# Patient Record
Sex: Female | Born: 1955
Health system: Southern US, Community
[De-identification: ages and names within clinical notes are randomized; demographics above are authoritative.]

## PROBLEM LIST (undated history)

## (undated) DIAGNOSIS — N393 Stress incontinence (female) (male): Secondary | ICD-10-CM

## (undated) DIAGNOSIS — T8859XA Other complications of anesthesia, initial encounter: Secondary | ICD-10-CM

## (undated) DIAGNOSIS — Z8719 Personal history of other diseases of the digestive system: Secondary | ICD-10-CM

## (undated) DIAGNOSIS — J40 Bronchitis, not specified as acute or chronic: Secondary | ICD-10-CM

## (undated) DIAGNOSIS — I10 Essential (primary) hypertension: Secondary | ICD-10-CM

## (undated) DIAGNOSIS — K219 Gastro-esophageal reflux disease without esophagitis: Secondary | ICD-10-CM

## (undated) DIAGNOSIS — G479 Sleep disorder, unspecified: Secondary | ICD-10-CM

## (undated) DIAGNOSIS — Z87898 Personal history of other specified conditions: Secondary | ICD-10-CM

## (undated) DIAGNOSIS — R011 Cardiac murmur, unspecified: Secondary | ICD-10-CM

## (undated) DIAGNOSIS — F419 Anxiety disorder, unspecified: Secondary | ICD-10-CM

## (undated) DIAGNOSIS — M1990 Unspecified osteoarthritis, unspecified site: Secondary | ICD-10-CM

## (undated) DIAGNOSIS — Z8489 Family history of other specified conditions: Secondary | ICD-10-CM

## (undated) DIAGNOSIS — Z9889 Other specified postprocedural states: Secondary | ICD-10-CM

## (undated) DIAGNOSIS — T4145XA Adverse effect of unspecified anesthetic, initial encounter: Secondary | ICD-10-CM

## (undated) DIAGNOSIS — R112 Nausea with vomiting, unspecified: Secondary | ICD-10-CM

## (undated) DIAGNOSIS — R200 Anesthesia of skin: Secondary | ICD-10-CM

---

## 1999-06-15 ENCOUNTER — Other Ambulatory Visit: Admission: RE | Admit: 1999-06-15 | Discharge: 1999-06-15 | Payer: Self-pay | Admitting: *Deleted

## 2000-05-15 ENCOUNTER — Encounter: Payer: Self-pay | Admitting: Gastroenterology

## 2000-05-15 ENCOUNTER — Encounter: Admission: RE | Admit: 2000-05-15 | Discharge: 2000-05-15 | Payer: Self-pay | Admitting: Gastroenterology

## 2001-03-12 ENCOUNTER — Other Ambulatory Visit: Admission: RE | Admit: 2001-03-12 | Discharge: 2001-03-12 | Payer: Self-pay | Admitting: *Deleted

## 2001-11-14 HISTORY — PX: CHOLECYSTECTOMY: SHX55

## 2002-04-30 ENCOUNTER — Other Ambulatory Visit: Admission: RE | Admit: 2002-04-30 | Discharge: 2002-04-30 | Payer: Self-pay | Admitting: *Deleted

## 2003-05-26 ENCOUNTER — Other Ambulatory Visit: Admission: RE | Admit: 2003-05-26 | Discharge: 2003-05-26 | Payer: Self-pay | Admitting: *Deleted

## 2003-07-25 ENCOUNTER — Observation Stay (HOSPITAL_COMMUNITY): Admission: RE | Admit: 2003-07-25 | Discharge: 2003-07-26 | Payer: Self-pay | Admitting: Surgery

## 2003-07-25 ENCOUNTER — Encounter: Payer: Self-pay | Admitting: Surgery

## 2003-07-25 ENCOUNTER — Encounter (INDEPENDENT_AMBULATORY_CARE_PROVIDER_SITE_OTHER): Payer: Self-pay | Admitting: *Deleted

## 2004-06-03 ENCOUNTER — Other Ambulatory Visit: Admission: RE | Admit: 2004-06-03 | Discharge: 2004-06-03 | Payer: Self-pay | Admitting: Obstetrics and Gynecology

## 2004-07-27 ENCOUNTER — Encounter: Admission: RE | Admit: 2004-07-27 | Discharge: 2004-07-27 | Payer: Self-pay | Admitting: Obstetrics and Gynecology

## 2005-03-30 ENCOUNTER — Ambulatory Visit (HOSPITAL_COMMUNITY): Admission: RE | Admit: 2005-03-30 | Discharge: 2005-03-30 | Payer: Self-pay | Admitting: Neurological Surgery

## 2005-03-31 ENCOUNTER — Encounter: Admission: RE | Admit: 2005-03-31 | Discharge: 2005-04-25 | Payer: Self-pay | Admitting: Neurological Surgery

## 2005-08-10 ENCOUNTER — Other Ambulatory Visit: Admission: RE | Admit: 2005-08-10 | Discharge: 2005-08-10 | Payer: Self-pay | Admitting: Obstetrics and Gynecology

## 2006-05-19 ENCOUNTER — Ambulatory Visit (HOSPITAL_COMMUNITY): Admission: RE | Admit: 2006-05-19 | Discharge: 2006-05-19 | Payer: Self-pay | Admitting: Gastroenterology

## 2006-09-12 ENCOUNTER — Encounter: Admission: RE | Admit: 2006-09-12 | Discharge: 2006-09-12 | Payer: Self-pay | Admitting: Obstetrics and Gynecology

## 2007-05-17 ENCOUNTER — Ambulatory Visit (HOSPITAL_COMMUNITY): Admission: RE | Admit: 2007-05-17 | Discharge: 2007-05-17 | Payer: Self-pay | Admitting: Family Medicine

## 2007-06-08 ENCOUNTER — Encounter: Admission: RE | Admit: 2007-06-08 | Discharge: 2007-06-08 | Payer: Self-pay | Admitting: Family Medicine

## 2007-08-03 ENCOUNTER — Encounter: Admission: RE | Admit: 2007-08-03 | Discharge: 2007-08-03 | Payer: Self-pay | Admitting: Gastroenterology

## 2007-08-21 ENCOUNTER — Ambulatory Visit: Payer: Self-pay | Admitting: Internal Medicine

## 2007-09-11 ENCOUNTER — Ambulatory Visit: Payer: Self-pay | Admitting: Internal Medicine

## 2007-10-10 ENCOUNTER — Ambulatory Visit: Payer: Self-pay | Admitting: Internal Medicine

## 2009-05-30 ENCOUNTER — Emergency Department (HOSPITAL_COMMUNITY): Admission: EM | Admit: 2009-05-30 | Discharge: 2009-05-30 | Payer: Self-pay | Admitting: Emergency Medicine

## 2010-03-16 ENCOUNTER — Emergency Department (HOSPITAL_COMMUNITY): Admission: EM | Admit: 2010-03-16 | Discharge: 2010-03-16 | Payer: Self-pay | Admitting: Emergency Medicine

## 2010-03-23 ENCOUNTER — Encounter (INDEPENDENT_AMBULATORY_CARE_PROVIDER_SITE_OTHER): Payer: Self-pay | Admitting: Family Medicine

## 2010-03-23 ENCOUNTER — Ambulatory Visit: Payer: Self-pay | Admitting: Surgery

## 2010-03-23 ENCOUNTER — Ambulatory Visit (HOSPITAL_COMMUNITY): Admission: RE | Admit: 2010-03-23 | Discharge: 2010-03-23 | Payer: Self-pay | Admitting: Family Medicine

## 2010-06-16 ENCOUNTER — Ambulatory Visit (HOSPITAL_COMMUNITY): Admission: RE | Admit: 2010-06-16 | Discharge: 2010-06-16 | Payer: Self-pay | Admitting: Internal Medicine

## 2010-12-05 ENCOUNTER — Encounter: Payer: Self-pay | Admitting: Obstetrics and Gynecology

## 2010-12-05 ENCOUNTER — Encounter: Payer: Self-pay | Admitting: Gastroenterology

## 2010-12-05 ENCOUNTER — Encounter: Payer: Self-pay | Admitting: Orthopedic Surgery

## 2011-02-01 LAB — BASIC METABOLIC PANEL
CO2: 24 mEq/L (ref 19–32)
Calcium: 8.5 mg/dL (ref 8.4–10.5)
Chloride: 109 mEq/L (ref 96–112)
Creatinine, Ser: 0.78 mg/dL (ref 0.4–1.2)
GFR calc Af Amer: >60 mL/min (ref >60)
GFR calc non Af Amer: >60 mL/min (ref >60)
Glucose, Bld: 114 mg/dL — ABNORMAL HIGH (ref 70–99)

## 2011-02-01 LAB — STOOL CULTURE

## 2011-02-01 LAB — DIFFERENTIAL
Basophils Absolute: 0 10*3/uL (ref 0.0–0.1)
Basophils Relative: 0 % (ref 0–1)
Eosinophils Relative: 0 % (ref 0–5)
Monocytes Absolute: 0.5 10*3/uL (ref 0.1–1.0)
Monocytes Relative: 6 % (ref 3–12)

## 2011-02-01 LAB — CBC
HCT: 38.1 % (ref 36.0–46.0)
Platelets: 283 10*3/uL (ref 150–400)
RBC: 4.18 MIL/uL (ref 3.87–5.11)

## 2011-03-29 NOTE — Assessment & Plan Note (Signed)
[facility]                             PULMONARY OFFICE NOTE   NELL, GALES                     MRN:          [id number]  DATE:08/21/2007                            DOB:          1956-07-11    REASON FOR CONSULTATION:  Dyspnea with cough.   HISTORY:  This is a 55 year old white female, former smoker, who works  as a Tour manager, and had the abrupt onset in June of a head and  chest cold that started off with a sore throat without chest symptoms,  and then had severe coughing paroxysms associated with dyspnea that have  never resolved.  The only time she feels better, in fact, is immediately  after albuterol, and this was documented on PFTs where she had a  normalization of FEV1.  She has undergone an evaluation by ENT with a  negative sinus evaluation, and has been started on Advair with only mild  improvement in her symptoms.  The only time she feels completely better  is immediately after albuterol, which she prefers to Xopenex.  She  denies any significant nocturnal exacerbations of cough, excess sputum  production, fevers, chills, sweats, orthopnea, PND, or leg swelling.   PAST MEDICAL HISTORY:  Significant for intermittent heartburn (she was  evaluated for this by Dr. Ewing Schlein, who apparently did not feel this was  related to her cough).  She is status post cholecystectomy.   ALLERGIES:  AVELOX.   NONSPECIFIC MEDICATIONS:  Ambien, vitamin C, and Advair.   SOCIAL HISTORY:  She quit smoking in 2001.  She works as a Engineer, civil (consulting), and is  planning to move to [place] soon.  She does notice that air  travel seems to bring on coughing chronically.   FAMILY HISTORY:  Significant for asthma in her mother.  Negative for  heart disease, lung disease, or atopy, otherwise.   REVIEW OF SYSTEMS:  Taken in detail on the worksheet.  Significant for  occasional reflux, which she denies correlates with her symptoms.   PHYSICAL EXAMINATION:   This is a pleasant ambulatory white female in no  acute distress.  She is afebrile with stable vital signs.  HEENT:  Unremarkable.  Oropharynx clear.  NECK:  Supple without cervical adenopathy or tenderness.  Trachea is  midline.  No thyromegaly.  LUNG FIELDS:  Reveal trace wheeze with FVC only.  HEART:  Regular rate and rhythm without murmur, gallop, or rub present.  ABDOMEN:  Soft and benign with no palpable organomegaly, mass, or  tenderness.  EXTREMITIES:  Warm without calf tenderness, cyanosis, clubbing, or  edema.   Chest x-ray was normal on June 08, 2007.  PFTs showed an FEV1 of 88%  predicted on July 3rd, but this improved 16% after bronchodilators, and  in resulted in the patient feeling immediately better.   IMPRESSION:  This patient clearly has reversible airflow obstruction  (asthma) whether she is willing to admit it or not.  The issue for today  is whether or not treating the asthma more aggressively will totally  eliminate her symptoms, and meet her expectations of not wanting to  take any medicine.  I think it is unlikely that her symptoms be  completely controlled by any other mechanism than medications.   I specifically recommended that she start Symbicort 160/4.5 two puffs  b.i.d. and spent extra time making sure she could use it effectively.  If she is having any breakthrough symptoms of cough, wheeze, or dyspnea,  she should use the albuterol 2 puffs q.4 p.r.n.  If the Symbicort, after  2 weeks, does not completely eliminate her symptoms, I would add back  Nexium, based on the fact that she does have intermittent heartburn  symptoms, and that 80% of patients with difficult-to-control symptoms  have reflux in some studies, and up to 100% have improvement with  treatment with Nexium when having overt heartburn symptoms, which does  definitely apply to this patient, even though she does not correlate the  heartburn with the symptoms as a cause and effect.      [name]. [name], MD, [facility]  Electronically Signed    MBW/MedQ  DD: 08/21/2007  DT: 08/22/2007  Job #: [id number]   cc:   Bryan Lemma. Manus Gunning, M.D.

## 2011-03-29 NOTE — Assessment & Plan Note (Signed)
[facility]                             PULMONARY OFFICE NOTE   Nicole, [name]                     MRN:          [id number]  DATE:09/11/2007                            DOB:          1956-02-20    PULMONARY EXTENDED FOLLOW-UP OFFICE VISIT:   HISTORY:  A 55 year old white female remote smoker with evidence of mild  airflow obstruction diagnosed by previous PFTs with a 16% improvement  after bronchodilators, consistent with asthma, and a history that  suggested asthma based on the fact that she responded so nicely to  albuterol.  However, she was started on Symbicort 160/4.5 mcg two puffs  b.i.d. on her previous visit and said this made no difference at all in  her symptoms nor her need for albuterol and she says only helps for 2-4  hours before it erupts again.  The symptoms are no worse while sleeping  but do seem worse after eating despite taking Nexium daily.  She denies,  however, any overt reflux or sinus symptoms, fevers, chills, sweats,  chest pain or leg swelling.   For a full inventory of medications, please see progress note dated  September 11, 2007.   PHYSICAL EXAMINATION:  She is an anxious white female in no acute  distress.  Stable vital signs.  HEENT:  Unremarkable.  Pharynx clear.  Lung fields perfectly clear bilaterally to auscultation and percussion.  There is a regular rate and rhythm without murmur, gallop, or rub.  ABDOMEN:  Soft, benign.  EXTREMITIES:  Warm without calf tenderness, cyanosis or clubbing.   To my surprise, she only approached 50% effectiveness with MDI  technique.   IMPRESSION:  Atypical asthma best fits the description for this patient.  Note the response to albuterol both by pulmonary function tests and by  history strongly suggests an asthmatic component and yet for reasons  that are not clear, she does not respond to Xopenex nor does she appear  to be responding to Symbicort.  Part of the reason  may be that her  technique is so poor in terms of metered-dose inhaler that she is  actually absorbing more of the albuterol through the mucosa of the  oropharynx and posterior pharynx than she is actually inhaling it.  She  also may have significant non-acid reflux playing a role here.   I reviewed the differential diagnosis with her, spending an extra 15-20  minutes of a 25-minute visit going over the data to date and also  optimal MDI technique, which she only mastered to about 50%  effectiveness, but that is better than where she started.   Before abandoning HFA entirely, I am simply going to ask her to resume  the medication for the next 4 weeks at the same dose, 160/4.5 mcg two  puffs b.i.d., and combine this with a 6-day course of prednisone to see  to what extent we can both open her airways and then keep them open  without the need for so much albuterol.   In addition, I think it is appropriate to continue to treat her  aggressively for both acid  and nonacid reflux since the majority of  patients with difficult-to-control asthma do have reflux, though not  necessarily cause and effect-related.  I have recommended that she  continue the Nexium at 40 mg q.a.m. and add Reglan 10 mg before meals  and at bedtime just for the next 4 weeks and then eliminate one medicine  at a time if she is improving.  If not improving, I think it is time to  regroup and consider a methacholine challenge test off of all  medications.     [name]. [name], MD, [facility]  Electronically Signed    MBW/MedQ  DD: 09/11/2007  DT: 09/12/2007  Job #: [id number]

## 2011-04-01 NOTE — Op Note (Signed)
NAME:  Nicole [name], HALBLEIB                        ACCOUNT NO.:  [id number]   MEDICAL RECORD NO.:  [id number]                   PATIENT TYPE:  OBV   LOCATION:  0472                                 FACILITY:  [facility]   PHYSICIAN:  Velora Heckler, M.D.                DATE OF BIRTH:  November 16, 1955   DATE OF PROCEDURE:  07/25/2003  DATE OF DISCHARGE:                                 OPERATIVE REPORT   PREOPERATIVE DIAGNOSIS:  Biliary dyskinesia, abdominal pain.   POSTOPERATIVE DIAGNOSIS:  Biliary dyskinesia, abdominal pain.   OPERATION/PROCEDURE:  Laparoscopic cholecystectomy.   SURGEON:  Velora Heckler, M.D.   ASSISTANT:  Gita Kudo, M.D.   ANESTHESIA:  General.   ESTIMATED BLOOD LOSS:  Minimal.   PREPARATION:  Betadine.   COMPLICATIONS:  None.   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  The patient notes exacerbation by caffeine and fatty foods.  She has had an extensive work up both in [place] and in [place], [place].  Abdominal ultrasound has been normal.  However, nuclear medicine  hepatobiliary scan showed a low gallbladder ejection fraction of 24% even  with CCK administration.  The patient now comes to surgery for  cholecystectomy for treatment of biliary dyskinesia and abdominal pain.   DESCRIPTION OF PROCEDURE:  The procedure was done in OR #6 at the [facility].  The patient is brought to the operating room,  placed in the supine position on the operating room table.  Following the  administration of general anesthesia, the patient is prepped and draped in  the usual strict aseptic fashion.  After ascertaining adequate level of  anesthesia had been obtained, an infraumbilical incision is made in the  midline with a #15 blade.  Dissection is carried down to the fascia.  Fascia  is incised and the peritoneal cavity is  entered cautiously.  A 0 Vicryl  pursestring suture is placed in the fascia.  A Hasson cannula is introduced  under direct vision and secured with the pursestring suture.  The abdomen is  insufflated with carbon dioxide.  Laparoscope is introduced under direct  vision and the abdomen explored.  Operative ports are placed along the right  costal margin in the midline, midclavicular line, and the anterior axillary  line.  Fundus of the gallbladder is grasped and retracted cephalad.  Gallbladder appears grossly normal.  There are no adhesions.  There is no  sign of inflammation.  Dissection is begun at the neck of the gallbladder.  Peritoneum is incised.  A minute cystic duct is dissected.  A clip is placed  at the neck of the gallbladder.  Cystic duct is incised.  Clear gold bile  emanates from the cystic duct.  Multiple attempts are made to place a [facility]  cholangiography catheter,  introduced into the right upper quadrant through a  stab wound into the cystic duct.  However, the spiral valves of Heister  prevent advancement of the catheter.  Two incisions are made into the cystic  duct, again without success in advancing the catheter.  Therefore, with  normal liver function tests and such a small cystic duct, a decision is made  not to proceed with cholangiography.  Cook catheter is withdrawn.  Cystic  duct is triply clipped and divided.  Cystic artery is dissected out, doubly  clipped and divided.  Gallbladder is then excised from the gallbladder bed  using a hook electrocautery for hemostasis. Gallbladder is placed into an  EndoCatch bag and withdrawn through the umbilical port.  On palpation it  does not appear to contain gallstones.  Good hemostasis is noted in the  right upper quadrant.  Abdomen is irrigated with warm saline which is  evacuated.  Ports are removed under direct vision and good hemostasis is  noted at all port sites.  A 0 Vicryl pursestring suture is tied securely.  All  port are removed and pneumoperitoneum is released.  Port sites are  anesthetized with local anesthetic.  All wounds are closed with interrupted  4-0 Vicryl subcuticular sutures.  Wounds are washed and dried and Steri-  Strips are applied.  Sterile dressings are applied. The patient is awakened  from anesthesia and brought to the recovery room in stable condition.  The  patient tolerated the procedure well.                                                Velora Heckler, M.D.    TMG/MEDQ  D:  07/25/2003  T:  07/25/2003  Job:  [id number]   cc:   Danise Edge, M.D.  [address]  [place]  [place] [address]  Fax: [phone]

## 2011-04-01 NOTE — Assessment & Plan Note (Signed)
[facility]                             PULMONARY OFFICE NOTE   Nicole [name], Nicole [name]                     MRN:          [id number]  DATE:10/10/2007                            DOB:          1956-01-02    HISTORY:  A 55 year old, white female seen at Dr. Randel Books request on  August 21, 2007, for a cough that had extended back to June of 2008.  She states that she is at least 90% better at this point and only coughs  immediately after eating especially if she eats greasy foods.  She  denies any dyspnea or excess sputum production, orthopnea, PND, or leg  swelling.   CURRENT MEDICATIONS:  1. Reglan 10 mg four times a day.  2. Nexium 40 mg daily.  3. Symbicort 160/4.5 two puffs b.i.d. with p.r.n. Albuterol, which she      finds she needs minimally now.   PHYSICAL EXAMINATION:  GENERAL:  She is a pleasant, ambulatory, white  female in no acute distress.  VITAL SIGNS:  She has stable vital signs.  HEENT:  Unremarkable.  Oropharynx clear.  LUNGS:  Completely clear bilaterally to auscultation and percussion.  HEART:  Regular rate and rhythm without murmur, gallop, or rub.  ABDOMEN:  Soft and benign.  EXTREMITIES:  Normal without any calf tenderness, cyanosis, clubbing, or  edema.   IMPRESSION:  I believe this patient has both asthma and reflux and,  although asthma and asthma medicines certainly can contribute to reflux,  I think it is more likely that reflux is causing almost all of her  symptoms at present including her asthma.  To prove this what we could  do is taper off the Symbicort and see if she flares while being treated  maximally for reflux.  Against doing this is the fact that she is still  having what she considers obvious breakthrough symptoms on a very  aggressive treatment directed at reflux and continues to have symptoms  immediately after meals.   Therefore, I am inclined to leave the medications as they are for now  with the  Symbicort perfectly regulated at two puffs b.i.d., Nexium  before breakfast daily, Reglan before meals , and continue to use, if  necessary, Xopenex two puffs q.4 p.r.n. symptoms of wheezing and  shortness of breath, Delsym two teaspoons every 12 hours, and follow up  with Dr. Ewing Schlein to see if anything else can be done to treat her reflux  more aggressively.  When she returns in six  weeks, I plan to see if we can taper off of Symbicort if there is  adequate control of her airways disease.     [name]. [name], MD, [facility]  Electronically Signed    MBW/MedQ  DD: 10/11/2007  DT: 10/11/2007  Job #: [id number]   cc:   Petra Kuba, M.D.

## 2011-07-22 ENCOUNTER — Other Ambulatory Visit: Payer: Self-pay | Admitting: Gastroenterology

## 2011-07-27 ENCOUNTER — Telehealth: Payer: Self-pay | Admitting: Cardiovascular Disease

## 2011-07-27 NOTE — Telephone Encounter (Signed)
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

## 2011-07-27 NOTE — Telephone Encounter (Addendum)
Dr Elease Hashimoto is still excepting new cardiology pts, records would be nice, but pt address is [place], [place] sees pts there, Dr Elease Hashimoto goes there too. Let me know if you need further assist. Thanks Alfonso Ramus RN

## 2011-08-04 ENCOUNTER — Ambulatory Visit: Payer: Self-pay | Admitting: Family Medicine

## 2011-08-11 ENCOUNTER — Ambulatory Visit (INDEPENDENT_AMBULATORY_CARE_PROVIDER_SITE_OTHER): Payer: 59 | Admitting: Family Medicine

## 2011-08-11 ENCOUNTER — Encounter: Payer: Self-pay | Admitting: Family Medicine

## 2011-08-11 DIAGNOSIS — E669 Obesity, unspecified: Secondary | ICD-10-CM | POA: Insufficient documentation

## 2011-08-11 DIAGNOSIS — I10 Essential (primary) hypertension: Secondary | ICD-10-CM | POA: Insufficient documentation

## 2011-08-11 NOTE — Patient Instructions (Addendum)
-   Before you see Dr. Leslie Dales, write down the chronology of your symptoms, medications tried, etc.   - With Remeron, many people are drawn especially to carbohydrates.  This means to get better appetite control, you want to choose a diet that is low-glycemic (AND INCLUDE SOME PROTEIN WITH EACH MEAL).  - Go to http://www.mendosa.com for info on glycemic load and BG control.   - Dietary principles:    1. Eat at least 3 meals and 1-2 snacks per day.  Aim for no more than 5 hours between eating.  2. PLAN AHEAD for meals and snacks; also for what you keep in the house; and for physical activity (and have a Plan B for bad weather).    3. Obtain twice as many veg's as protein or carbohydrate foods for both lunch and dinner.  (Starch will be optional.)  4. Adequate physical activity.   5. Social support:  Talk with Earvin Hansen re what are the best ways you can support each other.   - Google ROBERT LUSTIG and SUGAR; Colonel Bald, "The End of Overeating." - Goal:  Walk 30 min 4 X wk (working up to one hour walk).  - RECORD THE NUMBER OF MINUTES YOU WALK.   - Goal:  Daily food records.    Email availability for appt Oct 15, 16, or 11.

## 2011-08-11 NOTE — Progress Notes (Signed)
Medical Nutrition Therapy:  Appt start time: 1000 end time:  1100.  Assessment:  Primary concerns today: Weight management and hypertension.  Nicole [name] lost 30 lbs with diet and exercise in 2007, seeing RD Durwin Nora.  She then got sick in 2009, and stopped exercising.  Nicole [name] believes her symptoms were from the Reglan 4 X day she was taking then.  She was then prescribed Adderall, then a trial of Klonopin & Zoloft, none of which helped.  Symptoms were paralyzing depression and severe stomach pains; eventually saw Dr. Barnett Abu, and tried a series of drugs, ending up with Remeron 45 mg (April 2009), which has been a powerful appetite stimulant.     Usual eating pattern includes 3 meals and multiple snacks per day, including a large bedtime snack.  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  She tried Adipex appet suppressant this summer; felt great, but didn't lose any weight.   Everyday foods include 5 X 32 oz Coke, sweets.  Avoided foods include peanut butter & graham crackers (disagrees w/ her).  24-hr recall suggests an intake of ~2400 kcal from food and : B (9 AM)- coffee w/ f-f flavored creamer, 1 scrmbld egg (in butter), 2 slc bacon, 12 oz Coke; Snk (all day long)- Coke; L (1 PM)- water, sushi, salad, tempura veg's; Snk (2 PM)- Coke, fun sz Snickers, 2 X laughing cow cheese, 4 saltines; D (8 PM)- 3 c chx dumplings, 32 oz Coke; Snk ( PM)- pimiento chs sandw, 8 oz 1% milk, 2 Little Debbie sugar cookies, 3 c spaghetti w/ meat sauce, Coke.  Usual physical activity includes none.  Progress Towards Goal(s):  In progress.   Nutritional Diagnosis:  NB-2.1 Physical inactivity As related to poor motivation.  As evidenced by no regular physical activity. NI-5.8.2 Excessive carbohydrate intake As related to cravings.  As evidenced by 24-hr recall indicating >60 oz Coke and multiple sweets/starch food choices.    Intervention:  Nutrition education.  Monitoring/Evaluation:  Dietary intake, exercise, and  body weight in 3 week(s).

## 2011-09-22 ENCOUNTER — Encounter: Payer: Self-pay | Admitting: Internal Medicine

## 2011-09-23 ENCOUNTER — Ambulatory Visit (INDEPENDENT_AMBULATORY_CARE_PROVIDER_SITE_OTHER): Payer: 59 | Admitting: Internal Medicine

## 2011-09-23 ENCOUNTER — Encounter: Payer: Self-pay | Admitting: Internal Medicine

## 2011-09-23 ENCOUNTER — Ambulatory Visit (INDEPENDENT_AMBULATORY_CARE_PROVIDER_SITE_OTHER)
Admission: RE | Admit: 2011-09-23 | Discharge: 2011-09-23 | Disposition: A | Discharge status: Home / Self Care | Payer: 59 | Source: Ambulatory Visit | Attending: Internal Medicine | Admitting: Internal Medicine

## 2011-09-23 VITALS — BP 120/84 | HR 74 | Temp 98.2°F | Ht 66.0 in | Wt 207.8 lb

## 2011-09-23 DIAGNOSIS — R05 Cough: Secondary | ICD-10-CM

## 2011-09-23 DIAGNOSIS — R059 Cough, unspecified: Secondary | ICD-10-CM

## 2011-09-23 MED ORDER — BUDESONIDE-FORMOTEROL FUMARATE 80-4.5 MCG/ACT IN AERO: 2.0000 | INHALATION_SPRAY | Freq: Two times a day (BID) | RESPIRATORY_TRACT | Status: DC

## 2011-09-23 MED ORDER — TRAMADOL HCL 50 MG PO TABS: ORAL_TABLET | ORAL | Status: AC

## 2011-09-23 NOTE — Progress Notes (Signed)
  Subjective:    Patient ID: Nicole [name], female    DOB: 27-Jun-1956, 55 y.o.   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  Once the cough starts her nose starts running. As long as not coughing not sob. No h/o seasonal rhinitis or childhood asthma or exac with cold weather or assoc sinus dz. Has not tried any rx yet x take nexium  Sleeping ok without nocturnal  or early am exacerbation  of respiratory  c/o's or need for noct saba. Also denies any obvious fluctuation of symptoms with weather or environmental changes or other aggravating or alleviating factors except as outlined above      Review of Systems  Constitutional: Negative for fever, chills and unexpected weight change.  HENT: Negative for ear pain, nosebleeds, congestion, sore throat, rhinorrhea, sneezing, trouble swallowing, dental problem, voice change, postnasal drip and sinus pressure.   Eyes: Negative for visual disturbance.  Respiratory: Positive for cough and shortness of breath. Negative for choking.   Cardiovascular: Negative for chest pain and leg swelling.  Gastrointestinal: Negative for vomiting, abdominal pain and diarrhea.  Genitourinary: Negative for difficulty urinating.  Musculoskeletal: Negative for arthralgias.  Skin: Negative for rash.  Neurological: Negative for tremors, syncope and headaches.  Hematological: Does not bruise/bleed easily.       Objective:   Physical Exam   amb wf  nad  Wt 207 09/23/2011   HEENT: nl dentition, turbinates, and orophanx. Nl external ear canals without cough reflex   NECK :  without JVD/Nodes/TM/ nl carotid upstrokes bilaterally   LUNGS: no acc muscle use, clear to A and P bilaterally without cough on insp or exp maneuvers   CV:  RRR  no s3 or murmur or increase in P2, no edema   ABD:  soft and nontender with nl excursion in the supine position. No bruits or organomegaly, bowel sounds nl  MS:  warm without deformities, calf tenderness, cyanosis or clubbing  SKIN: warm and dry without lesions    NEURO:  alert, approp, no deficits   CXR  09/23/2011 :  No active disease. No significant change.        Assessment & Plan:

## 2011-09-23 NOTE — Patient Instructions (Addendum)
GERD (REFLUX)  is an extremely common cause of respiratory symptoms, many times with no significant heartburn at all.    It can be treated with medication, but also with lifestyle changes including avoidance of late meals, excessive alcohol, smoking cessation, and avoid fatty foods, chocolate, peppermint, colas, red wine, and acidic juices such as orange juice.  NO MINT OR MENTHOL PRODUCTS SO NO COUGH DROPS  USE SUGARLESS CANDY INSTEAD (jolley ranchers or Stover's)  NO OIL BASED VITAMINS - use powdered substitutes.   Take delsym two tsp every 12 hours and supplement if needed with  tramadol 50 mg up to 2 every 4 hours to suppress the urge to cough. Swallowing water or using ice chips/non mint and menthol containing candies (such as lifesavers or sugarless jolly ranchers) are also effective.  You should rest your voice and avoid activities that you know make you cough.  Once you have eliminated the cough for 3 straight days try reducing the tramadol first,  then the delsym as tolerated.    symbicort 80 Take 2 puffs first thing in am and then another 2 puffs about 12 hours later as needed for breathing or cough  Chlortrimeton 4 mg one at bedtime should eliminate the nasal drainage  Please remember to go to the x-ray department downstairs for your tests - we will call you with the results when they are available.   If you are satisfied with your treatment plan let your doctor know and he/she can either refill your medications or you can return here when your prescription runs out.     If in any way you are not 100% satisfied,  please tell us.  If 100% better, tell your friends!

## 2011-09-24 ENCOUNTER — Encounter: Payer: Self-pay | Admitting: Internal Medicine

## 2011-09-24 NOTE — Assessment & Plan Note (Addendum)
The most common causes of chronic cough in immunocompetent adults include the following: upper airway cough syndrome (UACS), previously referred to as postnasal drip syndrome (PNDS), which is caused by variety of rhinosinus conditions; (2) asthma; (3) GERD; (4) chronic bronchitis from cigarette smoking or other inhaled environmental irritants; (5) nonasthmatic eosinophilic bronchitis; and (6) bronchiectasis.   These conditions, singly or in combination, have accounted for up to 94% of the causes of chronic cough in prospective studies.   Other conditions have constituted no >6% of the causes in prospective studies These have included bronchogenic carcinoma, chronic interstitial pneumonia, sarcoidosis, left ventricular failure, ACEI-induced cough, and aspiration from a condition associated with pharyngeal dysfunction.   This is most likely recurrent  Classic Upper airway cough syndrome, so named because it's frequently impossible to sort out how much is  CR/sinusitis with freq throat clearing (which can be related to primary GERD)   vs  causing  secondary (" extra esophageal")  GERD from wide swings in gastric pressure that occur with throat clearing, often  promoting self use of mint and menthol lozenges that reduce the lower esophageal sphincter tone and exacerbate the problem further in a cyclical fashion.   These are the same pts who not infrequently have failed to tolerate ace inhibitors,  dry powder inhalers or biphosphonates or report having reflux symptoms that don't respond to standard doses of PPI , and are easily confused as having aecopd or asthma flares,  For now rx with 1st gen h1 per guidelines plus max gerd/ cyclical cough suppression with tramadol  and regroup if needed with methacholine challenge on max gerd rx next best option.  Ok to use symbicort prn but strongly doubt primary airways dz.  The proper method of use, as well as anticipated side effects, of this metered-dose inhaler are  discussed and demonstrated to the patient. Improved to 75% with coaching

## 2011-09-26 ENCOUNTER — Telehealth: Payer: Self-pay | Admitting: Internal Medicine

## 2011-09-26 NOTE — Telephone Encounter (Signed)
Spoke with pt and notified of results per Dr. Wert. Pt verbalized understanding and denied any questions. 

## 2011-11-15 HISTORY — PX: BACK SURGERY: SHX140

## 2012-04-04 ENCOUNTER — Encounter: Payer: Self-pay | Admitting: Internal Medicine

## 2012-04-04 ENCOUNTER — Ambulatory Visit (INDEPENDENT_AMBULATORY_CARE_PROVIDER_SITE_OTHER): Payer: 59 | Admitting: Internal Medicine

## 2012-04-04 VITALS — BP 138/88 | HR 81 | Temp 97.8°F | Ht 66.0 in | Wt 192.8 lb

## 2012-04-04 DIAGNOSIS — R059 Cough, unspecified: Secondary | ICD-10-CM

## 2012-04-04 DIAGNOSIS — R05 Cough: Secondary | ICD-10-CM

## 2012-04-04 MED ORDER — FAMOTIDINE 20 MG PO TABS: ORAL_TABLET | ORAL | Status: DC

## 2012-04-04 MED ORDER — ESOMEPRAZOLE MAGNESIUM 40 MG PO CPDR: DELAYED_RELEASE_CAPSULE | ORAL | Status: DC

## 2012-04-04 MED ORDER — METOCLOPRAMIDE HCL 10 MG PO TABS: 10.0000 mg | ORAL_TABLET | Freq: Three times a day (TID) | ORAL | Status: DC

## 2012-04-04 NOTE — Assessment & Plan Note (Signed)
Most likely this is a form of  Classic Upper airway cough syndrome, so named because it's frequently impossible to sort out how much is  CR/sinusitis with freq throat clearing (which can be related to primary GERD)   vs  causing  secondary (" extra esophageal")  GERD from wide swings in gastric pressure that occur with throat clearing, often  promoting self use of mint and menthol lozenges that reduce the lower esophageal sphincter tone and exacerbate the problem further in a cyclical fashion.   These are the same pts (now being labeled as having "irritable larynx syndrome" by some cough centers) who not infrequently have a history of having failed to tolerate ace inhibitors,  dry powder inhalers or biphosphonates or report having atypical reflux symptoms that don't respond to standard doses of PPI , and are easily confused as having aecopd or asthma flares by even experienced allergists/ pulmonologists.   For now max gerd rx with ppi and reglan then if not better do methacholine challenge while still on max gerd rx.    Each maintenance medication was reviewed in detail including most importantly the difference between maintenance and as needed and under what circumstances the prns are to be used.  Please see instructions for details which were reviewed in writing and the patient given a copy.    Extended discussion: The standardized cough guidelines recently published in Chest by [place] in 2006  are a multiple step process (up to 12!) , not a single office visit,  and are intended  to address this problem logically,  with an alogrithm dependent on response to empiric treatment at  each progressive step  to determine a specific diagnosis with  minimal addtional testing needed. Therefore if compliance is an issue or can't be accurately verified then it's very unlikely the standard evaluation and treatment will be successful here.    Furthermore, response to therapy (other than acute cough  suppression, which should only be used short term with avoidance of narcotic containing cough syrups if possible), can be a gradual process for which the patient may not receive immediate benefit.  Unlike going to an eye doctor where the right rx is almost always the first one and is immediately effective, this is almost never the case in the management of chronic cough syndromes and the patient needs to commit up front to compliance with recommendations and have the patience to wait out a response for up to 6 weeks of therapy directed at the likely underlying problem(s).

## 2012-04-04 NOTE — Patient Instructions (Signed)
GERD (REFLUX)  is an extremely common cause of respiratory symptoms, many times with no significant heartburn at all.    It can be treated with medication, but also with lifestyle changes including avoidance of late meals, excessive alcohol, smoking cessation, and avoid fatty foods, chocolate, peppermint, colas, red wine, and acidic juices such as orange juice.  NO MINT OR MENTHOL PRODUCTS SO NO COUGH DROPS  USE SUGARLESS CANDY INSTEAD (jolley ranchers or Stover's)  NO OIL BASED VITAMINS - use powdered substitutes.   Change nexium to Take 30- 60 min before your first and last meals of the day   Add pepcid 20 mg one at bedtime  Start Reglan 10 mg before meals and bedtime but taper off as soon as cough is gone.  Ok to use dulera but also should not use this long term.  The next step is a methacholine challenge test while on maximum GERD treatment - call [phone] and ask for [facility]

## 2012-04-04 NOTE — Progress Notes (Signed)
Subjective:    Patient ID: Nicole [name], female    DOB: 1956/09/07, 56 y.o.   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  Once the cough starts her nose starts running. As long as not coughing not sob. No h/o seasonal rhinitis or childhood asthma or exac with cold weather or assoc sinus dz. Has not tried any rx yet x take nexium rec GERD (REFLUX)  is an extremely common cause of respiratory symptoms, many times with no significant heartburn at all.    It can be treated with medication, but also with lifestyle changes including avoidance of late meals, excessive alcohol, smoking cessation, and avoid fatty foods, chocolate, peppermint, colas, red wine, and acidic juices such as orange juice.  NO MINT OR MENTHOL PRODUCTS SO NO COUGH DROPS  USE SUGARLESS CANDY INSTEAD (jolley ranchers or Stover's)  NO OIL BASED VITAMINS - use powdered substitutes.   Take delsym two tsp every 12 hours and supplement if needed with  tramadol 50 mg up to 2 every 4 hours to suppress the urge to cough. Swallowing water or using ice chips/non mint and menthol containing candies (such as lifesavers or sugarless jolly ranchers) are also effective.  You should rest your voice and avoid activities that you know make you cough.  Once you have eliminated the cough for 3 straight days try reducing the tramadol first,  then the delsym as tolerated.    symbicort 80 Take 2 puffs first thing  in am and then another 2 puffs about 12 hours later as needed for breathing or cough  Chlortrimeton 4 mg one at bedtime should eliminate the nasal drainage  Please remember to go to the x-ray department downstairs for your tests    04/04/2012 f/u ov/Yotam Rhine cc daily chronic cough since July 2012 resolved only on the tramadol. Worst after stirring in am and after supper.  Only med that really eliminated cough s suppression is reglan which caused foot cramps. No overt sinus or reflux symptoms but note take nexium at hs and has flare of symptoms typically p supper and before hs.   No sob/ dulera 100 helps maybe 25%  Sleeping ok without nocturnal  or early am exacerbation  of respiratory  c/o's or need for noct saba. Also denies any obvious fluctuation of symptoms with weather or environmental changes or other aggravating or alleviating factors except as outlined above .  ROS  At present neg for  any significant sore throat, dysphagia, dental problems, itching, sneezing,  nasal congestion or excess/ purulent secretions, ear ache,   fever, chills, sweats, unintended wt loss, pleuritic or exertional cp, hemoptysis, palpitations, orthopnea pnd or leg swelling.  Also denies presyncope, palpitations, heartburn, abdominal pain, anorexia, nausea, vomiting, diarrhea  or change in bowel or urinary habits, change in stools or urine, dysuria,hematuria,  rash, arthralgias, visual complaints, headache, numbness weakness or ataxia or problems with walking  or coordination. No noted change in mood/affect or memory.                     .       Objective:   Physical Exam   amb wf nad  Wt 207 09/23/2011  >  04/04/2012  192  HEENT: nl dentition, turbinates, and orophanx. Nl external ear canals without cough reflex   NECK :  without JVD/Nodes/TM/ nl carotid upstrokes bilaterally   LUNGS: no acc muscle use, clear to A and P bilaterally without cough on insp or exp maneuvers   CV:  RRR  no s3 or murmur  or increase in P2, no edema   ABD:  soft and nontender with nl excursion in the supine position. No bruits or organomegaly, bowel sounds nl  MS:  warm without deformities, calf tenderness, cyanosis or clubbing     CXR  09/23/2011 :  No active disease. No significant change.        Assessment & Plan:

## 2012-04-05 ENCOUNTER — Telehealth: Payer: Self-pay | Admitting: Internal Medicine

## 2012-04-05 NOTE — Telephone Encounter (Signed)
Try one half dose and hang in there through the holiday weekend then consider gi eval as we have nothing else to offer

## 2012-04-05 NOTE — Telephone Encounter (Signed)
I spoke with pt and is aware of MW recs. She voiced her understanding and had no questions

## 2012-04-05 NOTE — Telephone Encounter (Signed)
I spoke with pt and she states the reglan makes her feel depressed, has no motivation, and feels "blah". She is requesting an alternative. Please advise MW, thanks  Allergies  Allergen Reactions  . Avelox (Moxifloxacin Hcl In Nacl) Nausea And Vomiting  . Metoclopramide Hcl     Cramping, anxiety and depression

## 2012-05-07 ENCOUNTER — Telehealth: Payer: Self-pay | Admitting: Internal Medicine

## 2012-05-07 MED ORDER — BENZONATATE 200 MG PO CAPS: 200.0000 mg | ORAL_CAPSULE | Freq: Three times a day (TID) | ORAL | Status: AC | PRN

## 2012-05-07 NOTE — Telephone Encounter (Signed)
RX has been sent--lm w/ coworker for pt tcb

## 2012-05-07 NOTE — Telephone Encounter (Signed)
She can take what she has or we can call in tessilon 200mg  tid prn #40

## 2012-05-07 NOTE — Telephone Encounter (Signed)
I spoke with pt and she c/o wet cough w/ occasional frothy color phlem and only happens after she eats. She is requesting tessalon pearls for this. Also pt stopped taking the Reglan on Saturday bc it caused leg and feet cramping. Pt can't come in for OV bc she is working. Also wanted to let MW know she is going to go see Dr. Annalee Genta on Thursday. Please advise Dr. [name], thanks  Allergies  Allergen Reactions  . Avelox (Moxifloxacin Hcl In Nacl) Nausea And Vomiting  . Metoclopramide Hcl     Cramping, anxiety and depression     [facility]

## 2012-05-07 NOTE — Telephone Encounter (Signed)
Dr. [name], Rudene Anda is not on pt's med list.  Pls advise how you would like this sent.  Thank you.

## 2012-05-07 NOTE — Telephone Encounter (Signed)
Ok to use [employer]

## 2012-05-07 NOTE — Telephone Encounter (Signed)
Pt returned call.  I informed her MW recs she try tessilon 200 mg tid prn cough.  Rx has been sent to [facility].  Advised if sxs do not improve or worsen, pls call back.  She verbalized understanding of this.

## 2012-05-30 ENCOUNTER — Other Ambulatory Visit: Payer: Self-pay | Admitting: Neurological Surgery

## 2012-05-30 DIAGNOSIS — M549 Dorsalgia, unspecified: Secondary | ICD-10-CM

## 2012-05-31 ENCOUNTER — Other Ambulatory Visit (HOSPITAL_COMMUNITY): Payer: Self-pay | Admitting: Neurological Surgery

## 2012-05-31 DIAGNOSIS — M5416 Radiculopathy, lumbar region: Secondary | ICD-10-CM

## 2012-06-02 ENCOUNTER — Ambulatory Visit (HOSPITAL_COMMUNITY)
Admission: RE | Admit: 2012-06-02 | Discharge: 2012-06-02 | Disposition: A | Discharge status: Home / Self Care | Payer: PRIVATE HEALTH INSURANCE | Source: Ambulatory Visit | Attending: Neurological Surgery | Admitting: Neurological Surgery

## 2012-06-02 DIAGNOSIS — M545 Low back pain, unspecified: Secondary | ICD-10-CM | POA: Insufficient documentation

## 2012-06-02 DIAGNOSIS — IMO0002 Reserved for concepts with insufficient information to code with codable children: Secondary | ICD-10-CM | POA: Insufficient documentation

## 2012-06-02 DIAGNOSIS — M5416 Radiculopathy, lumbar region: Secondary | ICD-10-CM

## 2012-06-02 DIAGNOSIS — M5126 Other intervertebral disc displacement, lumbar region: Secondary | ICD-10-CM | POA: Insufficient documentation

## 2012-06-05 ENCOUNTER — Other Ambulatory Visit (HOSPITAL_COMMUNITY): Payer: 59

## 2012-06-15 ENCOUNTER — Other Ambulatory Visit: Payer: 59

## 2012-06-26 ENCOUNTER — Ambulatory Visit
Admission: RE | Admit: 2012-06-26 | Discharge: 2012-06-26 | Disposition: A | Discharge status: Home / Self Care | Payer: Self-pay | Source: Ambulatory Visit | Attending: Neurological Surgery | Admitting: Neurological Surgery

## 2012-06-26 DIAGNOSIS — M549 Dorsalgia, unspecified: Secondary | ICD-10-CM

## 2012-06-26 MED ORDER — METHYLPREDNISOLONE ACETATE 40 MG/ML INJ SUSP (RADIOLOG
120.0000 mg | Freq: Once | INTRAMUSCULAR | Status: AC
  Administered 2012-06-26: 120 mg via EPIDURAL

## 2012-06-26 MED ORDER — IOHEXOL 180 MG/ML  SOLN
1.0000 mL | Freq: Once | INTRAMUSCULAR | Status: AC | PRN
  Administered 2012-06-26: 1 mL via EPIDURAL

## 2012-06-26 NOTE — Discharge Instructions (Signed)

## 2012-08-27 ENCOUNTER — Other Ambulatory Visit: Payer: Self-pay | Admitting: Neurological Surgery

## 2012-08-27 ENCOUNTER — Encounter (HOSPITAL_COMMUNITY): Payer: Self-pay | Admitting: Pharmacy Technician

## 2012-08-30 ENCOUNTER — Encounter (HOSPITAL_COMMUNITY)
Admission: RE | Admit: 2012-08-30 | Discharge: 2012-08-30 | Disposition: A | Discharge status: Home / Self Care | Payer: PRIVATE HEALTH INSURANCE | Source: Ambulatory Visit | Attending: Neurological Surgery | Admitting: Neurological Surgery

## 2012-08-30 ENCOUNTER — Encounter (HOSPITAL_COMMUNITY): Payer: Self-pay

## 2012-08-30 HISTORY — DX: Other specified postprocedural states: Z98.890

## 2012-08-30 HISTORY — DX: Essential (primary) hypertension: I10

## 2012-08-30 HISTORY — DX: Bronchitis, not specified as acute or chronic: J40

## 2012-08-30 HISTORY — DX: Adverse effect of unspecified anesthetic, initial encounter: T41.45XA

## 2012-08-30 HISTORY — DX: Cardiac murmur, unspecified: R01.1

## 2012-08-30 HISTORY — DX: Family history of other specified conditions: Z84.89

## 2012-08-30 HISTORY — DX: Personal history of other diseases of the digestive system: Z87.19

## 2012-08-30 HISTORY — DX: Gastro-esophageal reflux disease without esophagitis: K21.9

## 2012-08-30 HISTORY — DX: Other complications of anesthesia, initial encounter: T88.59XA

## 2012-08-30 HISTORY — DX: Nausea with vomiting, unspecified: R11.2

## 2012-08-30 HISTORY — DX: Unspecified osteoarthritis, unspecified site: M19.90

## 2012-08-30 LAB — BASIC METABOLIC PANEL
BUN: 12 mg/dL (ref 6–23)
CO2: 30 mEq/L (ref 19–32)
Chloride: 102 mEq/L (ref 96–112)
GFR calc Af Amer: >90 mL/min (ref >90)
Glucose, Bld: 100 mg/dL — ABNORMAL HIGH (ref 70–99)
Potassium: 3.9 mEq/L (ref 3.5–5.1)

## 2012-08-30 LAB — CBC
HCT: 38.4 % (ref 36.0–46.0)
Hemoglobin: 13 g/dL (ref 12.0–15.0)
MCHC: 33.9 g/dL (ref 30.0–36.0)
MCV: 90.6 fL (ref 78.0–100.0)

## 2012-08-30 LAB — SURGICAL PCR SCREEN: Staphylococcus aureus: NEGATIVE

## 2012-08-30 NOTE — Pre-Procedure Instructions (Signed)
20 JAIDIN UGARTE  08/30/2012   Your procedure is scheduled on:  Monday September 10, 2012  Report to [facility] at 5:30 AM.  Call this number if you have problems the morning of surgery: [phone]   Remember:   Do not eat food or drink :After Midnight.      Take these medicines the morning of surgery with A SIP OF WATER: nexium   Do not wear jewelry, make-up or nail polish.  Do not wear lotions, powders, or perfumes.  Do not shave 48 hours prior to surgery. Men may shave face and neck.  Do not bring valuables to the hospital.  Contacts, dentures or bridgework may not be worn into surgery.  Leave suitcase in the car. After surgery it may be brought to your room.  For patients admitted to the hospital, checkout time is 11:00 AM the day of discharge.   Patients discharged the day of surgery will not be allowed to drive home.  Name and phone number of your driver: family / friend  Special Instructions: Shower using CHG 2 nights before surgery and the night before surgery.  If you shower the day of surgery use CHG.  Use special wash - you have one bottle of CHG for all showers.  You should use approximately 1/3 of the bottle for each shower.   Please read over the following fact sheets that you were given: Pain Booklet, Coughing and Deep Breathing, MRSA Information and Surgical Site Infection Prevention

## 2012-09-09 MED ORDER — CEFAZOLIN SODIUM-DEXTROSE 2-3 GM-% IV SOLR
2.0000 g | INTRAVENOUS | Status: AC
  Administered 2012-09-10: 2 g via INTRAVENOUS
  Filled 2012-09-09: qty 50

## 2012-09-10 ENCOUNTER — Ambulatory Visit (HOSPITAL_COMMUNITY): Payer: PRIVATE HEALTH INSURANCE

## 2012-09-10 ENCOUNTER — Encounter (HOSPITAL_COMMUNITY): Payer: Self-pay | Admitting: Anesthesiology

## 2012-09-10 ENCOUNTER — Ambulatory Visit (HOSPITAL_COMMUNITY): Payer: PRIVATE HEALTH INSURANCE | Admitting: Anesthesiology

## 2012-09-10 ENCOUNTER — Ambulatory Visit (HOSPITAL_COMMUNITY)
Admission: RE | Admit: 2012-09-10 | Discharge: 2012-09-11 | Disposition: A | Discharge status: Home / Self Care | Payer: PRIVATE HEALTH INSURANCE | Source: Ambulatory Visit | Attending: Neurological Surgery | Admitting: Neurological Surgery

## 2012-09-10 ENCOUNTER — Encounter (HOSPITAL_COMMUNITY)
Admission: RE | Disposition: A | Discharge status: Home / Self Care | Payer: Self-pay | Source: Ambulatory Visit | Attending: Neurological Surgery

## 2012-09-10 DIAGNOSIS — M5126 Other intervertebral disc displacement, lumbar region: Secondary | ICD-10-CM | POA: Insufficient documentation

## 2012-09-10 DIAGNOSIS — Z01810 Encounter for preprocedural cardiovascular examination: Secondary | ICD-10-CM | POA: Insufficient documentation

## 2012-09-10 DIAGNOSIS — M47817 Spondylosis without myelopathy or radiculopathy, lumbosacral region: Secondary | ICD-10-CM | POA: Insufficient documentation

## 2012-09-10 DIAGNOSIS — J4489 Other specified chronic obstructive pulmonary disease: Secondary | ICD-10-CM | POA: Insufficient documentation

## 2012-09-10 DIAGNOSIS — J449 Chronic obstructive pulmonary disease, unspecified: Secondary | ICD-10-CM | POA: Insufficient documentation

## 2012-09-10 DIAGNOSIS — M5127 Other intervertebral disc displacement, lumbosacral region: Secondary | ICD-10-CM

## 2012-09-10 DIAGNOSIS — F17200 Nicotine dependence, unspecified, uncomplicated: Secondary | ICD-10-CM | POA: Insufficient documentation

## 2012-09-10 DIAGNOSIS — Z01812 Encounter for preprocedural laboratory examination: Secondary | ICD-10-CM | POA: Insufficient documentation

## 2012-09-10 DIAGNOSIS — I10 Essential (primary) hypertension: Secondary | ICD-10-CM | POA: Insufficient documentation

## 2012-09-10 HISTORY — PX: LUMBAR LAMINECTOMY/DECOMPRESSION MICRODISCECTOMY: SHX5026

## 2012-09-10 SURGERY — LUMBAR LAMINECTOMY/DECOMPRESSION MICRODISCECTOMY 1 LEVEL
Anesthesia: General | Site: Back | Laterality: Left | Wound class: Clean

## 2012-09-10 MED ORDER — LACTATED RINGERS IV SOLN
INTRAVENOUS | Status: DC | PRN
  Administered 2012-09-10: 07:00:00 via INTRAVENOUS

## 2012-09-10 MED ORDER — ONDANSETRON HCL 4 MG/2ML IJ SOLN
INTRAMUSCULAR | Status: DC | PRN
  Administered 2012-09-10: 4 mg via INTRAVENOUS

## 2012-09-10 MED ORDER — MORPHINE SULFATE 2 MG/ML IJ SOLN: 1.0000 mg | INTRAMUSCULAR | Status: DC | PRN

## 2012-09-10 MED ORDER — GLYCOPYRROLATE 0.2 MG/ML IJ SOLN
INTRAMUSCULAR | Status: DC | PRN
  Administered 2012-09-10: 0.4 mg via INTRAVENOUS

## 2012-09-10 MED ORDER — ACETAMINOPHEN 325 MG PO TABS: 650.0000 mg | ORAL_TABLET | ORAL | Status: DC | PRN

## 2012-09-10 MED ORDER — FENTANYL CITRATE 0.05 MG/ML IJ SOLN
INTRAMUSCULAR | Status: DC | PRN
  Administered 2012-09-10: 50 ug via INTRAVENOUS
  Administered 2012-09-10 (×3): 100 ug via INTRAVENOUS

## 2012-09-10 MED ORDER — FLUTICASONE-SALMETEROL 100-50 MCG/DOSE IN AEPB
1.0000 | INHALATION_SPRAY | Freq: Every day | RESPIRATORY_TRACT | Status: DC | PRN
  Filled 2012-09-10: qty 14

## 2012-09-10 MED ORDER — MOMETASONE FURO-FORMOTEROL FUM 100-5 MCG/ACT IN AERO: 2.0000 | INHALATION_SPRAY | Freq: Every day | RESPIRATORY_TRACT | Status: DC | PRN

## 2012-09-10 MED ORDER — BACITRACIN 50000 UNITS IM SOLR
INTRAMUSCULAR | Status: AC
  Filled 2012-09-10: qty 1

## 2012-09-10 MED ORDER — CEFAZOLIN SODIUM 1-5 GM-% IV SOLN
1.0000 g | Freq: Three times a day (TID) | INTRAVENOUS | Status: AC
  Administered 2012-09-10 (×2): 1 g via INTRAVENOUS
  Filled 2012-09-10 (×2): qty 50

## 2012-09-10 MED ORDER — HYDROMORPHONE HCL PF 1 MG/ML IJ SOLN: 0.2500 mg | INTRAMUSCULAR | Status: DC | PRN

## 2012-09-10 MED ORDER — ONDANSETRON HCL 4 MG/2ML IJ SOLN: 4.0000 mg | INTRAMUSCULAR | Status: DC | PRN

## 2012-09-10 MED ORDER — PROMETHAZINE HCL 25 MG/ML IJ SOLN
INTRAMUSCULAR | Status: AC
  Filled 2012-09-10: qty 1

## 2012-09-10 MED ORDER — ACETAMINOPHEN 10 MG/ML IV SOLN
INTRAVENOUS | Status: AC
  Administered 2012-09-10: 1000 mg via INTRAVENOUS
  Filled 2012-09-10: qty 100

## 2012-09-10 MED ORDER — PROMETHAZINE HCL 25 MG/ML IJ SOLN: 6.2500 mg | INTRAMUSCULAR | Status: DC | PRN

## 2012-09-10 MED ORDER — OXYCODONE-ACETAMINOPHEN 5-325 MG PO TABS
1.0000 | ORAL_TABLET | ORAL | Status: DC | PRN
  Administered 2012-09-10 – 2012-09-11 (×4): 1 via ORAL
  Filled 2012-09-10: qty 2
  Filled 2012-09-10 (×3): qty 1

## 2012-09-10 MED ORDER — ACETAMINOPHEN 650 MG RE SUPP: 650.0000 mg | RECTAL | Status: DC | PRN

## 2012-09-10 MED ORDER — DIAZEPAM 5 MG PO TABS
5.0000 mg | ORAL_TABLET | Freq: Four times a day (QID) | ORAL | Status: DC | PRN
  Administered 2012-09-10: 5 mg via ORAL
  Filled 2012-09-10: qty 1

## 2012-09-10 MED ORDER — BUPIVACAINE HCL (PF) 0.5 % IJ SOLN
INTRAMUSCULAR | Status: DC | PRN
  Administered 2012-09-10: 30 mL

## 2012-09-10 MED ORDER — PROPOFOL 10 MG/ML IV BOLUS
INTRAVENOUS | Status: DC | PRN
  Administered 2012-09-10: 130 mg via INTRAVENOUS

## 2012-09-10 MED ORDER — PHENOL 1.4 % MT LIQD: 1.0000 | OROMUCOSAL | Status: DC | PRN

## 2012-09-10 MED ORDER — KETOROLAC TROMETHAMINE 30 MG/ML IJ SOLN
30.0000 mg | Freq: Four times a day (QID) | INTRAMUSCULAR | Status: DC
  Administered 2012-09-10 – 2012-09-11 (×4): 30 mg via INTRAVENOUS
  Filled 2012-09-10 (×5): qty 1

## 2012-09-10 MED ORDER — LIDOCAINE HCL (CARDIAC) 20 MG/ML IV SOLN
INTRAVENOUS | Status: DC | PRN
  Administered 2012-09-10: 30 mg via INTRAVENOUS

## 2012-09-10 MED ORDER — OXYCODONE HCL 5 MG PO TABS: 5.0000 mg | ORAL_TABLET | Freq: Once | ORAL | Status: DC | PRN

## 2012-09-10 MED ORDER — SODIUM CHLORIDE 0.9 % IJ SOLN
3.0000 mL | Freq: Two times a day (BID) | INTRAMUSCULAR | Status: DC
  Administered 2012-09-10 (×2): 3 mL via INTRAVENOUS

## 2012-09-10 MED ORDER — SODIUM CHLORIDE 0.9 % IV SOLN
INTRAVENOUS | Status: AC
  Filled 2012-09-10: qty 500

## 2012-09-10 MED ORDER — PROMETHAZINE HCL 25 MG/ML IJ SOLN
INTRAMUSCULAR | Status: DC | PRN
  Administered 2012-09-10: 6.25 mg via INTRAVENOUS

## 2012-09-10 MED ORDER — KETOROLAC TROMETHAMINE 30 MG/ML IJ SOLN
INTRAMUSCULAR | Status: DC | PRN
  Administered 2012-09-10: 30 mg via INTRAVENOUS

## 2012-09-10 MED ORDER — DEXAMETHASONE SODIUM PHOSPHATE 4 MG/ML IJ SOLN
INTRAMUSCULAR | Status: DC | PRN
  Administered 2012-09-10: 10 mg via INTRAVENOUS

## 2012-09-10 MED ORDER — MENTHOL 3 MG MT LOZG: 1.0000 | LOZENGE | OROMUCOSAL | Status: DC | PRN

## 2012-09-10 MED ORDER — LOSARTAN POTASSIUM 50 MG PO TABS
100.0000 mg | ORAL_TABLET | Freq: Every day | ORAL | Status: DC
Start: 2012-09-10 — End: 2012-09-11
  Filled 2012-09-10 (×2): qty 2

## 2012-09-10 MED ORDER — MIDAZOLAM HCL 2 MG/2ML IJ SOLN: 0.5000 mg | Freq: Once | INTRAMUSCULAR | Status: DC | PRN

## 2012-09-10 MED ORDER — MIDAZOLAM HCL 5 MG/5ML IJ SOLN
INTRAMUSCULAR | Status: DC | PRN
  Administered 2012-09-10: 2 mg via INTRAVENOUS

## 2012-09-10 MED ORDER — SODIUM CHLORIDE 0.9 % IJ SOLN: 3.0000 mL | INTRAMUSCULAR | Status: DC | PRN

## 2012-09-10 MED ORDER — NEOSTIGMINE METHYLSULFATE 1 MG/ML IJ SOLN
INTRAMUSCULAR | Status: DC | PRN
  Administered 2012-09-10: 3 mg via INTRAVENOUS

## 2012-09-10 MED ORDER — MEPERIDINE HCL 25 MG/ML IJ SOLN: 6.2500 mg | INTRAMUSCULAR | Status: DC | PRN

## 2012-09-10 MED ORDER — THROMBIN 5000 UNITS EX SOLR
CUTANEOUS | Status: DC | PRN
  Administered 2012-09-10: 10000 [IU] via TOPICAL

## 2012-09-10 MED ORDER — SUCCINYLCHOLINE CHLORIDE 20 MG/ML IJ SOLN
INTRAMUSCULAR | Status: DC | PRN
  Administered 2012-09-10: 100 mg via INTRAVENOUS

## 2012-09-10 MED ORDER — PANTOPRAZOLE SODIUM 40 MG PO TBEC: 80.0000 mg | DELAYED_RELEASE_TABLET | Freq: Every day | ORAL | Status: DC

## 2012-09-10 MED ORDER — OXYCODONE HCL 5 MG/5ML PO SOLN: 5.0000 mg | Freq: Once | ORAL | Status: DC | PRN

## 2012-09-10 MED ORDER — SODIUM CHLORIDE 0.9 % IV SOLN: 250.0000 mL | INTRAVENOUS | Status: DC

## 2012-09-10 MED ORDER — ALUM & MAG HYDROXIDE-SIMETH 200-200-20 MG/5ML PO SUSP: 30.0000 mL | Freq: Four times a day (QID) | ORAL | Status: DC | PRN

## 2012-09-10 MED ORDER — SODIUM CHLORIDE 0.9 % IR SOLN
Status: DC | PRN
  Administered 2012-09-10: 09:00:00

## 2012-09-10 MED ORDER — LIDOCAINE-EPINEPHRINE 1 %-1:100000 IJ SOLN
INTRAMUSCULAR | Status: DC | PRN
  Administered 2012-09-10: 20 mL via INTRADERMAL

## 2012-09-10 MED ORDER — 0.9 % SODIUM CHLORIDE (POUR BTL) OPTIME
TOPICAL | Status: DC | PRN
  Administered 2012-09-10: 1000 mL

## 2012-09-10 MED ORDER — SCOPOLAMINE 1 MG/3DAYS TD PT72
MEDICATED_PATCH | TRANSDERMAL | Status: AC
  Administered 2012-09-10: 1 via TRANSDERMAL
  Filled 2012-09-10: qty 1

## 2012-09-10 MED ORDER — ROCURONIUM BROMIDE 100 MG/10ML IV SOLN
INTRAVENOUS | Status: DC | PRN
  Administered 2012-09-10: 40 mg via INTRAVENOUS

## 2012-09-10 SURGICAL SUPPLY — 57 items
ADH SKN CLS APL DERMABOND .7 (GAUZE/BANDAGES/DRESSINGS) ×1
ADH SKN CLS LQ APL DERMABOND (GAUZE/BANDAGES/DRESSINGS) ×1
BAG DECANTER FOR FLEXI CONT (MISCELLANEOUS) ×2 IMPLANT
BLADE SURG ROTATE 9660 (MISCELLANEOUS) IMPLANT
BUR ACORN 6.0 (BURR) IMPLANT
BUR MATCHSTICK NEURO 3.0 LAGG (BURR) ×2 IMPLANT
CANISTER SUCTION 2500CC (MISCELLANEOUS) ×2 IMPLANT
CLOTH BEACON ORANGE TIMEOUT ST (SAFETY) ×2 IMPLANT
CONT SPEC 4OZ CLIKSEAL STRL BL (MISCELLANEOUS) ×2 IMPLANT
DECANTER SPIKE VIAL GLASS SM (MISCELLANEOUS) ×2 IMPLANT
DERMABOND ADHESIVE PROPEN (GAUZE/BANDAGES/DRESSINGS) ×1
DERMABOND ADVANCED (GAUZE/BANDAGES/DRESSINGS) ×1
DERMABOND ADVANCED .7 DNX12 (GAUZE/BANDAGES/DRESSINGS) ×1 IMPLANT
DERMABOND ADVANCED .7 DNX6 (GAUZE/BANDAGES/DRESSINGS) IMPLANT
DRAPE MICROSCOPE LEICA (MISCELLANEOUS) ×1 IMPLANT
DRAPE PED LAPAROTOMY (DRAPES) ×2 IMPLANT
DRAPE POUCH INSTRU U-SHP 10X18 (DRAPES) ×2 IMPLANT
DRAPE PROXIMA HALF (DRAPES) IMPLANT
DURAPREP 26ML APPLICATOR (WOUND CARE) ×2 IMPLANT
ELECT REM PT RETURN 9FT ADLT (ELECTROSURGICAL) ×2
ELECTRODE REM PT RTRN 9FT ADLT (ELECTROSURGICAL) ×1 IMPLANT
GAUZE SPONGE 4X4 16PLY XRAY LF (GAUZE/BANDAGES/DRESSINGS) IMPLANT
GLOVE BIO SURGEON STRL SZ8 (GLOVE) ×1 IMPLANT
GLOVE BIOGEL PI IND STRL 8 (GLOVE) IMPLANT
GLOVE BIOGEL PI IND STRL 8.5 (GLOVE) ×1 IMPLANT
GLOVE BIOGEL PI INDICATOR 8 (GLOVE) ×1
GLOVE BIOGEL PI INDICATOR 8.5 (GLOVE) ×1
GLOVE ECLIPSE 7.5 STRL STRAW (GLOVE) ×4 IMPLANT
GLOVE ECLIPSE 8.5 STRL (GLOVE) ×2 IMPLANT
GLOVE EXAM NITRILE LRG STRL (GLOVE) IMPLANT
GLOVE EXAM NITRILE MD LF STRL (GLOVE) IMPLANT
GLOVE EXAM NITRILE XL STR (GLOVE) IMPLANT
GLOVE EXAM NITRILE XS STR PU (GLOVE) IMPLANT
GLOVE INDICATOR 8.5 STRL (GLOVE) ×1 IMPLANT
GOWN BRE IMP SLV AUR LG STRL (GOWN DISPOSABLE) IMPLANT
GOWN BRE IMP SLV AUR XL STRL (GOWN DISPOSABLE) ×2 IMPLANT
GOWN STRL REIN 2XL LVL4 (GOWN DISPOSABLE) ×3 IMPLANT
KIT BASIN OR (CUSTOM PROCEDURE TRAY) ×2 IMPLANT
KIT ROOM TURNOVER OR (KITS) ×2 IMPLANT
NDL SPNL 20GX3.5 QUINCKE YW (NEEDLE) IMPLANT
NEEDLE HYPO 22GX1.5 SAFETY (NEEDLE) ×2 IMPLANT
NEEDLE SPNL 20GX3.5 QUINCKE YW (NEEDLE) IMPLANT
NS IRRIG 1000ML POUR BTL (IV SOLUTION) ×2 IMPLANT
PACK LAMINECTOMY NEURO (CUSTOM PROCEDURE TRAY) ×2 IMPLANT
PAD ARMBOARD 7.5X6 YLW CONV (MISCELLANEOUS) ×6 IMPLANT
PATTIES SURGICAL .5 X1 (DISPOSABLE) ×2 IMPLANT
RUBBERBAND STERILE (MISCELLANEOUS) IMPLANT
SPONGE GAUZE 4X4 12PLY (GAUZE/BANDAGES/DRESSINGS) ×2 IMPLANT
SPONGE SURGIFOAM ABS GEL SZ50 (HEMOSTASIS) ×2 IMPLANT
SUT VIC AB 1 CT1 18XBRD ANBCTR (SUTURE) ×1 IMPLANT
SUT VIC AB 1 CT1 8-18 (SUTURE) ×2
SUT VIC AB 2-0 CP2 18 (SUTURE) ×2 IMPLANT
SUT VIC AB 3-0 SH 8-18 (SUTURE) ×2 IMPLANT
SYR 20ML ECCENTRIC (SYRINGE) ×2 IMPLANT
TOWEL OR 17X24 6PK STRL BLUE (TOWEL DISPOSABLE) ×2 IMPLANT
TOWEL OR 17X26 10 PK STRL BLUE (TOWEL DISPOSABLE) ×2 IMPLANT
WATER STERILE IRR 1000ML POUR (IV SOLUTION) ×2 IMPLANT

## 2012-09-10 NOTE — Op Note (Signed)
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 She's been advised regarding surgical extirpation of the disc.  Procedure: The patient was brought to the operating room supine on a stretcher. After the smooth induction of general endotracheal anesthesia patient was carefully turned to the prone position with the bony prominences being appropriately padded and protected. The lower lumbar spine was prepped with alcohol and DuraPrep and then draped in a sterile fashion. The needle was used to localize the area of L5-S1 and a radiograph was obtained demonstrating a needle at the level of L5. Skin was infiltrated with 10 cc of lidocaine 1-100,000 epinephrine mixed 50-50 with half percent Marcaine. An incision was made and carried down to the lumbar dorsal fascia the fascia was opened on the left side of the midline and a subperiosteal dissection was performed in the interlaminar space of L5-S1. A self-retaining retractor was then placed into the wound. The yellow ligament was taken up from the interlaminar space at L5-S1 and the inferior margin of the lamina of L5 was removed and a partial facetectomy was performed at L5-S1. The common dural tube was explored and the take off of the S1 nerve root was identified and gradually mobilized. There was significant fibrosis around the nerve was some evidence for epidural steroid medication residue. This was cleared and  mobilized and some epidural veins were cauterized and divided. This allowed retraction of the S1 nerve root. A foraminotomy was required over the S1 nerve root at S1 because of a significant subligamentous protrusion. Further dissection identified that this was a contained disc fragment under the ligament. The ligament was incised in a vertical fashion with a 15 blade. A large singular fragment of disc was extruded from this region. This allowed for good and immediate decompression of the nerve root. Further palpation yielded no other fragments. The disc space itself appeared to be sealed. No further exploration was performed after the S1 nerve root was identified as well decompressed.  After adequate decompression was obtained hemostasis and the soft tissues was verified retractor was removed the operating microscope was removed from the field and the lumbar dorsal fascia was closed with #1 and 2-0 Vicryl in interrupted fashion. 3-0 Vicryl was used to close subcuticular skin. Blood loss was estimated at 20 cc.

## 2012-09-10 NOTE — Plan of Care (Signed)
Problem: Consults Goal: Diagnosis - Spinal Surgery Outcome: Completed/Met Date Met:  09/10/12 Microdiscectomy

## 2012-09-10 NOTE — Transfer of Care (Signed)
Immediate Anesthesia Transfer of Care Note  Patient: Nicole [name]  Procedure(s) Performed: Procedure(s) (LRB) with comments: LUMBAR LAMINECTOMY/DECOMPRESSION MICRODISCECTOMY 1 LEVEL (Left) - Left Lumbar Five-Sacral One Microdiscectomy  Patient Location: PACU  Anesthesia Type:No value filed.  Level of Consciousness: awake, alert , oriented and patient cooperative  Airway & Oxygen Therapy: Patient Spontanous Breathing and Patient connected to nasal cannula oxygen  Post-op Assessment: Report given to PACU RN, Post -op Vital signs reviewed and stable and Patient moving all extremities X 4  Post vital signs: Reviewed and stable  Complications: No apparent anesthesia complications

## 2012-09-10 NOTE — H&P (Signed)
Nicole [name] is an 56 y.o. female.   Chief Complaint: Back and left leg pain HPI: Patient is a 56 year old right-handed individual who works as an Producer, television/film/video at [employer]. She's had back and left lower extremity pain now for a number of months being increasingly refractory to conservative care. Epidural steroid injections have given her temporary relief she has evidence of spondylitic degeneration at L5-S1 with left lateral recess stenosis and what appears to be a small disc located just under the take off of the S1 nerve root on the left side. She's been advised regarding surgery and is now admitted for this procedure.  Past Medical History  Diagnosis Date  . Heart burn   . Complication of anesthesia   . PONV (postoperative nausea and vomiting)   . Family history of anesthesia complication     N&V- Mother   . Heart murmur     since birth  . Hypertension     followed by Dr. Manus Gunning   . H/O hiatal hernia     small hiatal hernia, has had endoscopy & colondoscopy  . GERD (gastroesophageal reflux disease)     Chron's disease, not medically treating currently   . Arthritis     lumbar- HNP, /w myelopathy  . Bronchitis     used albuterol , Sept. 2013, all clear now   . Asthma     /w reflux , consult /w Dr. Wert-2013    Past Surgical History  Procedure Date  . Cholecystectomy 03    laparoscopic    No family history on file. Social History:  reports that she quit smoking about 12 years ago. Her smoking use included Cigarettes. She has a 40.5 pack-year smoking history. She has never used smokeless tobacco. She reports that she drinks alcohol. She reports that she does not use illicit drugs.  Allergies:  Allergies  Allergen Reactions  . Metoclopramide Hcl Other (See Comments)    Cramping, anxiety and depression; "makes me crazy"  . Avelox (Moxifloxacin Hcl In Nacl) Nausea And Vomiting  . Neosporin (Neomycin-Bacitracin Zn-Polymyx) Other (See Comments)   Blisters, rash    Medications Prior to Admission  Medication Sig Dispense Refill  . esomeprazole (NEXIUM) 40 MG capsule Take 40 mg by mouth 2 (two) times daily. Take 30- 60 min before your first and last meals of the day      . losartan (COZAAR) 100 MG tablet Take 100 mg by mouth daily before breakfast.       . losartan (COZAAR) 50 MG tablet Take 100 mg by mouth daily.       . Mometasone Furo-Formoterol Fum (DULERA IN) Inhale 2 puffs into the lungs daily as needed.        No results found for this or any previous visit (from the past 48 hour(s)). No results found.  Review of Systems  HENT: Negative.   Eyes:       Glasses  Respiratory: Negative.   Cardiovascular:       Murmur  Gastrointestinal: Negative.   Genitourinary: Negative.   Musculoskeletal: Positive for back pain.  Neurological: Positive for sensory change, focal weakness and weakness.  Endo/Heme/Allergies: Negative.   Psychiatric/Behavioral:       Anxiety    Blood pressure 132/89, pulse 61, temperature 97.7 F (36.5 C), temperature source Oral, resp. rate 20, SpO2 98.00%. Physical Exam  Constitutional: She is oriented to person, place, and time. She appears well-developed and well-nourished.  HENT:  Head: Normocephalic and atraumatic.  Eyes: Conjunctivae normal and EOM are normal. Pupils are equal, round, and reactive to light.  Neck: Normal range of motion. Neck supple.  Cardiovascular: Normal rate and regular rhythm.   Murmur heard. Respiratory: Effort normal and breath sounds normal.  GI: Soft. Bowel sounds are normal.  Musculoskeletal:       Strait leg raising positive at 45  Neurological: She is alert and oriented to person, place, and time. She has normal reflexes.  Skin: Skin is warm and dry.  Psychiatric: She has a normal mood and affect. Her behavior is normal. Judgment and thought content normal.     Assessment/Plan [name]  #[id number] DOB:  Sep 18, 1956:     Nicole [name] is admitted today for  difficulties that she is having with the left lumbar radiculopathy in the S1 distribution.  She notes that she did get some relief with the epidural steroid injection. However, she notes that she still gets cramping in the calf and also in the small muscles of her foot with her toes clinching down.  Nonetheless, she finds that the radiculopathy is not as bad as it had been.  She would like to hold off on surgery if at all possible.    I reviewed the MRI and demonstrated the findings to her.  The lateral recess stenosis for the S1 nerve root caught between the disc on one side and the overgrown facet on the opposite side.  I indicated that I believe Nicole [name] would do well with a simple laminotomy/foraminotomy.  A diskectomy could be performed if a loose fragment of disc is found near the nerve root but overall decompressing the S1 nerve root should go a long way toward helping alleviate the worst of her symptoms.  The decision to proceed with surgery is elective and is hers to make.  I believe that she would likely do well with surgery but if she is tolerating her symptoms and she is not experiencing any weakness then we can wait.  Today's exam demonstrates that she has good strength in the gastrocs with no evidence of any obvious atrophy.  She has an absent Achilles reflex on the left side but otherwise her gait and station are normal.  She is to have a laminotomy and microdiscectomy.L5- S1 left.    Nicole [name] J 09/10/2012, 7:57 AM

## 2012-09-10 NOTE — Anesthesia Postprocedure Evaluation (Signed)
  Anesthesia Post-op Note  Patient: Nicole [name]  Procedure(s) Performed: Procedure(s) (LRB) with comments: LUMBAR LAMINECTOMY/DECOMPRESSION MICRODISCECTOMY 1 LEVEL (Left) - Left Lumbar Five-Sacral One Microdiscectomy  Patient Location: PACU  Anesthesia Type: General   Level of Consciousness: awake, alert , oriented and patient cooperative  Airway and Oxygen Therapy: Patient Spontanous Breathing and Patient connected to nasal cannula oxygen  Post-op Pain: mild  Post-op Assessment: Post-op Vital signs reviewed, Patient's Cardiovascular Status Stable, Respiratory Function Stable, Patent Airway, No signs of Nausea or vomiting and Pain level controlled  Post-op Vital Signs: Reviewed and stable  Complications: No apparent anesthesia complications

## 2012-09-10 NOTE — Progress Notes (Signed)
Pt has stress incont. Has underwear on

## 2012-09-10 NOTE — Progress Notes (Signed)
Patient ID: Nicole [name], female   DOB: Jun 19, 1956, 56 y.o.   MRN: [id number] Incision clean and dry patient has no leg pain ambulating no nausea wishes to stay overnight.

## 2012-09-10 NOTE — Anesthesia Preprocedure Evaluation (Addendum)
Anesthesia Evaluation  Patient identified by MRN, date of birth, ID band Patient awake    Reviewed: Allergy & Precautions, H&P , NPO status , Patient's Chart, lab work & pertinent test results  History of Anesthesia Complications (+) PONV and Family history of anesthesia reaction  Airway Mallampati: I TM Distance: >3 FB Neck ROM: Full    Dental  (+) Teeth Intact, Chipped and Dental Advisory Given   Pulmonary asthma , COPD COPD inhaler, Current Smoker,  Chronic cough bronchitis breath sounds clear to auscultation  Pulmonary exam normal       Cardiovascular hypertension, Pt. on medications + Valvular Problems/Murmurs (no prior echo) Rhythm:Regular Rate:Normal     Neuro/Psych myelopathic negative psych ROS   GI/Hepatic Neg liver ROS, hiatal hernia, GERD-  Medicated and Poorly Controlled,  Endo/Other    Renal/GU negative Renal ROS     Musculoskeletal   Abdominal (+) + obese,   Peds  Hematology   Anesthesia Other Findings   Reproductive/Obstetrics                         Anesthesia Physical Anesthesia Plan  ASA: II  Anesthesia Plan: General   Post-op Pain Management:    Induction: Intravenous  Airway Management Planned: Oral ETT  Additional Equipment:   Intra-op Plan:   Post-operative Plan: Extubation in OR  Informed Consent: I have reviewed the patients History and Physical, chart, labs and discussed the procedure including the risks, benefits and alternatives for the proposed anesthesia with the patient or authorized representative who has indicated his/her understanding and acceptance.   Dental advisory given  Plan Discussed with: CRNA, Surgeon and Anesthesiologist  Anesthesia Plan Comments: (Plan routine monitors, GETA)       Anesthesia Quick Evaluation

## 2012-09-11 MED ORDER — DIAZEPAM 5 MG PO TABS: 5.0000 mg | ORAL_TABLET | Freq: Four times a day (QID) | ORAL | Status: DC | PRN

## 2012-09-11 MED ORDER — HYDROCODONE-ACETAMINOPHEN 5-500 MG PO TABS: 1.0000 | ORAL_TABLET | Freq: Four times a day (QID) | ORAL | Status: DC | PRN

## 2012-09-11 NOTE — Discharge Summary (Signed)
Physician Discharge Summary  Patient ID: Nicole [name] MRN: [id number] DOB/AGE: February 05, 1956 56 y.o.  Admit date: 09/10/2012 Discharge date: 09/11/2012  Admission Diagnoses: Herniated nucleus pulposus L5-S1 left with left lumbar radiculopathy, lumbar spondylosis  Discharge Diagnoses: Herniated nucleus pulposus L5-S1 left with left lumbar radiculopathy, lumbar spondylosis  Active Problems:  * No active hospital problems. *    Discharged Condition: good  Hospital Course: Patient was admitted to undergo surgical decompression at L5-S1 on left side. She tolerated procedure well.  Consults: None  Significant Diagnostic Studies: MRI lumbar spine demonstrated herniated nucleus pulposus and spondylosis L5-S1  Treatments: surgery: Laminotomy foraminotomies L5-S1 left with discectomy L5-S1 left operating microscope microdissection technique  Discharge Exam: Blood pressure 114/74, pulse 75, temperature 98.7 F (37.1 C), temperature source Oral, resp. rate 18, SpO2 96.00%. Incision is clean dry motor function is normal in lower extremities  Disposition:  home  Discharge Orders    Future Orders Please Complete By Expires   Diet - low sodium heart healthy      Increase activity slowly      Discharge instructions      Comments:   Okay to shower. Do not apply salves or appointments to incision. No heavy lifting with the upper extremities greater than 15 pounds. May resume driving when not requiring pain medication and patient feels comfortable with doing so.   Call MD for:  redness, tenderness, or signs of infection (pain, swelling, redness, odor or green/yellow discharge around incision site)      Call MD for:  severe uncontrolled pain      Call MD for:  temperature >100.4          Medication List     As of 09/11/2012  8:48 AM    TAKE these medications         diazepam 5 MG tablet   Commonly known as: VALIUM   Take 1 tablet (5 mg total) by mouth every 6 (six) hours as needed  (Muscle spasm).      DULERA IN   Inhale 2 puffs into the lungs daily as needed.      esomeprazole 40 MG capsule   Commonly known as: NEXIUM   Take 40 mg by mouth 2 (two) times daily. Take 30- 60 min before your first and last meals of the day      HYDROcodone-acetaminophen 5-500 MG per tablet   Commonly known as: VICODIN   Take 1 tablet by mouth every 6 (six) hours as needed for pain.      losartan 50 MG tablet   Commonly known as: COZAAR   Take 100 mg by mouth daily.      losartan 100 MG tablet   Commonly known as: COZAAR   Take 100 mg by mouth daily before breakfast.         Signed: Stefani Dama 09/11/2012, 8:48 AM

## 2012-09-13 ENCOUNTER — Encounter (HOSPITAL_COMMUNITY): Payer: Self-pay | Admitting: Neurological Surgery

## 2012-09-28 ENCOUNTER — Other Ambulatory Visit (HOSPITAL_COMMUNITY): Payer: Self-pay | Admitting: *Deleted

## 2012-09-28 DIAGNOSIS — R011 Cardiac murmur, unspecified: Secondary | ICD-10-CM

## 2012-10-04 ENCOUNTER — Other Ambulatory Visit: Payer: Self-pay | Admitting: Obstetrics and Gynecology

## 2012-10-04 ENCOUNTER — Ambulatory Visit (HOSPITAL_COMMUNITY)
Admission: RE | Admit: 2012-10-04 | Discharge: 2012-10-04 | Disposition: A | Discharge status: Home / Self Care | Payer: 59 | Source: Ambulatory Visit | Attending: Family Medicine | Admitting: Family Medicine

## 2012-10-04 DIAGNOSIS — I10 Essential (primary) hypertension: Secondary | ICD-10-CM | POA: Insufficient documentation

## 2012-10-04 DIAGNOSIS — R05 Cough: Secondary | ICD-10-CM | POA: Insufficient documentation

## 2012-10-04 DIAGNOSIS — R011 Cardiac murmur, unspecified: Secondary | ICD-10-CM | POA: Insufficient documentation

## 2012-10-04 DIAGNOSIS — E669 Obesity, unspecified: Secondary | ICD-10-CM | POA: Insufficient documentation

## 2012-10-04 DIAGNOSIS — R059 Cough, unspecified: Secondary | ICD-10-CM | POA: Insufficient documentation

## 2012-10-04 NOTE — Progress Notes (Signed)
  Echocardiogram 2D Echocardiogram has been performed.  Latina Frank 10/04/2012, 11:46 AM 

## 2012-10-09 ENCOUNTER — Ambulatory Visit (HOSPITAL_COMMUNITY): Payer: 59

## 2012-11-29 ENCOUNTER — Ambulatory Visit: Payer: PRIVATE HEALTH INSURANCE | Admitting: Rehabilitative and Restorative Service Providers"

## 2012-12-06 ENCOUNTER — Ambulatory Visit
Payer: PRIVATE HEALTH INSURANCE | Attending: Neurological Surgery | Admitting: Rehabilitative and Restorative Service Providers"

## 2012-12-06 DIAGNOSIS — M545 Low back pain, unspecified: Secondary | ICD-10-CM | POA: Insufficient documentation

## 2012-12-06 DIAGNOSIS — M2569 Stiffness of other specified joint, not elsewhere classified: Secondary | ICD-10-CM | POA: Insufficient documentation

## 2012-12-06 DIAGNOSIS — IMO0001 Reserved for inherently not codable concepts without codable children: Secondary | ICD-10-CM | POA: Insufficient documentation

## 2012-12-10 ENCOUNTER — Ambulatory Visit
Payer: PRIVATE HEALTH INSURANCE | Attending: Family Medicine | Admitting: Rehabilitative and Restorative Service Providers"

## 2012-12-12 ENCOUNTER — Ambulatory Visit: Payer: PRIVATE HEALTH INSURANCE | Admitting: Rehabilitative and Restorative Service Providers"

## 2012-12-17 ENCOUNTER — Ambulatory Visit
Payer: PRIVATE HEALTH INSURANCE | Attending: Neurological Surgery | Admitting: Rehabilitative and Restorative Service Providers"

## 2012-12-17 DIAGNOSIS — IMO0001 Reserved for inherently not codable concepts without codable children: Secondary | ICD-10-CM | POA: Insufficient documentation

## 2012-12-17 DIAGNOSIS — M2569 Stiffness of other specified joint, not elsewhere classified: Secondary | ICD-10-CM | POA: Insufficient documentation

## 2012-12-17 DIAGNOSIS — M545 Low back pain, unspecified: Secondary | ICD-10-CM | POA: Insufficient documentation

## 2012-12-25 ENCOUNTER — Ambulatory Visit: Payer: PRIVATE HEALTH INSURANCE | Attending: Neurological Surgery | Admitting: Physical Therapy

## 2012-12-25 DIAGNOSIS — IMO0001 Reserved for inherently not codable concepts without codable children: Secondary | ICD-10-CM | POA: Insufficient documentation

## 2012-12-25 DIAGNOSIS — M545 Low back pain, unspecified: Secondary | ICD-10-CM | POA: Insufficient documentation

## 2012-12-25 DIAGNOSIS — M2569 Stiffness of other specified joint, not elsewhere classified: Secondary | ICD-10-CM | POA: Insufficient documentation

## 2012-12-27 ENCOUNTER — Ambulatory Visit: Payer: PRIVATE HEALTH INSURANCE | Admitting: Physical Therapy

## 2012-12-31 ENCOUNTER — Ambulatory Visit: Payer: PRIVATE HEALTH INSURANCE | Admitting: Rehabilitative and Restorative Service Providers"

## 2013-01-03 ENCOUNTER — Ambulatory Visit: Payer: PRIVATE HEALTH INSURANCE | Admitting: Rehabilitative and Restorative Service Providers"

## 2013-01-08 ENCOUNTER — Ambulatory Visit: Payer: PRIVATE HEALTH INSURANCE | Admitting: Physical Therapy

## 2013-01-10 ENCOUNTER — Encounter: Payer: Self-pay | Admitting: Physical Therapy

## 2013-01-14 ENCOUNTER — Ambulatory Visit: Payer: PRIVATE HEALTH INSURANCE | Admitting: Rehabilitation

## 2013-01-16 ENCOUNTER — Encounter: Payer: Self-pay | Admitting: Physical Therapy

## 2013-01-25 ENCOUNTER — Ambulatory Visit: Payer: PRIVATE HEALTH INSURANCE | Admitting: Physical Therapy

## 2013-01-28 ENCOUNTER — Ambulatory Visit: Payer: PRIVATE HEALTH INSURANCE | Attending: Neurology | Admitting: Physical Therapy

## 2013-01-28 DIAGNOSIS — M545 Low back pain, unspecified: Secondary | ICD-10-CM | POA: Insufficient documentation

## 2013-01-28 DIAGNOSIS — M2569 Stiffness of other specified joint, not elsewhere classified: Secondary | ICD-10-CM | POA: Insufficient documentation

## 2013-01-28 DIAGNOSIS — IMO0001 Reserved for inherently not codable concepts without codable children: Secondary | ICD-10-CM | POA: Insufficient documentation

## 2013-01-31 ENCOUNTER — Ambulatory Visit: Payer: PRIVATE HEALTH INSURANCE | Admitting: Physical Therapy

## 2013-02-01 ENCOUNTER — Ambulatory Visit: Payer: PRIVATE HEALTH INSURANCE | Admitting: Physical Therapy

## 2013-02-05 ENCOUNTER — Ambulatory Visit: Payer: PRIVATE HEALTH INSURANCE | Admitting: Physical Therapy

## 2013-02-07 ENCOUNTER — Ambulatory Visit (INDEPENDENT_AMBULATORY_CARE_PROVIDER_SITE_OTHER): Payer: 59 | Admitting: Neurology

## 2013-02-07 ENCOUNTER — Encounter: Payer: Self-pay | Admitting: Neurology

## 2013-02-07 VITALS — BP 143/88 | HR 83 | Ht 65.5 in | Wt 189.0 lb

## 2013-02-07 DIAGNOSIS — R42 Dizziness and giddiness: Secondary | ICD-10-CM | POA: Insufficient documentation

## 2013-02-07 NOTE — Progress Notes (Signed)
[name] is a 57 years old right-handed Caucasian female, referred by her primary care physician Dr. 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 Danielle Dess in 2012, had a residual left lateral toes, lateral leg numbness,  Since the summer of 2013, she had a few episode of acute onset nausea, unsteady gait, all triggered by sudden positional changes, initial episode was after lying on the floor playing with her dog, she suddenly felt nausea, dizzy, symptoms persisent  after she sat up, lasting for few minutes,   Second episode was in January 2014, after lying flat practicing yoga, turning towards the right side, she felt nausea, drained, unsteady gait, lasting for a few hours,  Most recent episode was after lying flat to have her eyebrow trimmed, she sometimes has occasionally unsteadiness with sudden turning,  Reported normal echocardiogram,  she denies hearing change   MRI of brain was normal, MRA of brain and neck was normal. She continued to have occasionally nausea sensation when she lies flat, she complains of excessive stress, she does not want to have repositioning maneuver,  March 27th 2014: She can't read books, watching TV screens that moving fast, she also describe difficulty driving, has to look away, she could not stare at the road, right ear stuff up.  She is taking xanax for quiziness. decongrestion for her ear. She has underwent vestibular rehabilitation, there was no significant improvement, she was able to pass the exam without difficulty, she also complains of right ear stuffiness, decreased hearing, she was previously treated for depression with Remeron, Lexapro, has stopped the medication since October 2013, she will see her psychiatrist today, she complains of difficulty focusing depression,  Review of  Systems  Out of a complete 14 system review, the patient complains of only the following symptoms, and all other reviewed systems are negative.   Constitutional:   N/A Cardiovascular:  N/A Ear/Nose/Throat: Ringing in the ears Skin: N/A Eyes: N/A Respiratory: N/A Gastroitestinal: Nausea, GERD.   Hematology/Lymphatic:  N/A Musculoskeletal:N/A Endocrine:  N/A Neurological: Headaches Psychiatric:    Depression anxiety    Physical Exam  Neck: supple no carotid bruits Respiratory: clear to auscultation bilaterally Cardiovascular: regular rate rhythm   Neurologic Exam  Mental Status: pleasant, awake, alert, cooperative to history, talking, and casual conversation. Cranial Nerves: CN II-XII pupils were equal round reactive to light.  Fundi were sharp bilaterally.  Extraocular movements were full.  Visual fields were full on confrontational test. Right beating nystagmus on extreme gaze to right.  Facial sensation and strength were normal.  Hearing was intact to finger rubbing bilaterally.  Uvula tongue were midline.  Head turning and shoulder shrugging were normal and symmetric.  Tongue protrusion into the cheeks strength were normal.  Motor: Normal tone, bulk, and strength. Sensory: Normal to light touch, pinprick, proprioception, and vibratory sensation. Coordination: Normal finger-to-nose, heel-to-shin.  There was no dysmetria noticed. Gait and Station: Narrow based and steady, was able to perform tiptoe, heel, and tandem walking without difficulty.  Romberg sign: Negative Reflexes: Deep tendon reflexes: Biceps: 2/2, Brachioradialis: 2/2, Triceps: 2/2, Pateller: 2/2, Achilles: 2/0.  Plantar responses are flexor.   Assessment and plan 57 years old Caucasian female, with recurrent position induced nausea, unsteady gait, right beating nystagmus,  1, most consistent with a benign positional vertigo, she complains of right ear stuffiness sensation 2,  repositional maneuver at home 3. Refer  her to ENT Dr. Annalee Genta

## 2013-02-08 ENCOUNTER — Ambulatory Visit: Payer: PRIVATE HEALTH INSURANCE | Admitting: Physical Therapy

## 2013-02-15 ENCOUNTER — Encounter: Payer: Self-pay | Admitting: Physical Therapy

## 2013-02-18 ENCOUNTER — Encounter: Payer: Self-pay | Admitting: Physical Therapy

## 2013-02-19 ENCOUNTER — Encounter: Payer: Self-pay | Admitting: Physical Therapy

## 2013-03-14 ENCOUNTER — Ambulatory Visit: Payer: Self-pay | Admitting: Neurology

## 2013-03-20 ENCOUNTER — Telehealth: Payer: Self-pay | Admitting: Neurology

## 2013-03-20 NOTE — Telephone Encounter (Signed)
Tried calling patient to reschedule an appointment with Dr. Terrace Arabia.  There is a conflict with Dr. Zannie Cove schedule on this date.

## 2013-04-11 ENCOUNTER — Other Ambulatory Visit (HOSPITAL_COMMUNITY): Payer: Self-pay | Admitting: Neurological Surgery

## 2013-04-11 DIAGNOSIS — M545 Low back pain, unspecified: Secondary | ICD-10-CM

## 2013-04-15 ENCOUNTER — Other Ambulatory Visit (HOSPITAL_COMMUNITY): Payer: Self-pay

## 2013-04-16 ENCOUNTER — Ambulatory Visit (HOSPITAL_COMMUNITY): Admission: RE | Admit: 2013-04-16 | Payer: PRIVATE HEALTH INSURANCE | Source: Ambulatory Visit

## 2013-04-16 ENCOUNTER — Ambulatory Visit (HOSPITAL_COMMUNITY)
Admission: RE | Admit: 2013-04-16 | Discharge: 2013-04-16 | Disposition: A | Discharge status: Home / Self Care | Payer: PRIVATE HEALTH INSURANCE | Source: Ambulatory Visit | Attending: Neurological Surgery | Admitting: Neurological Surgery

## 2013-04-16 ENCOUNTER — Other Ambulatory Visit (HOSPITAL_COMMUNITY): Payer: Self-pay | Admitting: Neurological Surgery

## 2013-04-16 DIAGNOSIS — M545 Low back pain, unspecified: Secondary | ICD-10-CM

## 2013-04-16 DIAGNOSIS — M5126 Other intervertebral disc displacement, lumbar region: Secondary | ICD-10-CM | POA: Insufficient documentation

## 2013-04-16 DIAGNOSIS — R209 Unspecified disturbances of skin sensation: Secondary | ICD-10-CM | POA: Insufficient documentation

## 2013-04-16 MED ORDER — GADOBENATE DIMEGLUMINE 529 MG/ML IV SOLN
17.0000 mL | Freq: Once | INTRAVENOUS | Status: AC
  Administered 2013-04-16: 17 mL via INTRAVENOUS

## 2013-04-17 ENCOUNTER — Ambulatory Visit (HOSPITAL_COMMUNITY): Payer: PRIVATE HEALTH INSURANCE

## 2013-05-01 ENCOUNTER — Other Ambulatory Visit: Payer: Self-pay | Admitting: Neurological Surgery

## 2013-05-01 DIAGNOSIS — M549 Dorsalgia, unspecified: Secondary | ICD-10-CM

## 2013-05-09 ENCOUNTER — Ambulatory Visit
Admission: RE | Admit: 2013-05-09 | Discharge: 2013-05-09 | Disposition: A | Discharge status: Home / Self Care | Payer: Worker's Compensation | Source: Ambulatory Visit | Attending: Neurological Surgery | Admitting: Neurological Surgery

## 2013-05-09 VITALS — BP 115/78 | HR 82

## 2013-05-09 DIAGNOSIS — M549 Dorsalgia, unspecified: Secondary | ICD-10-CM

## 2013-05-09 MED ORDER — IOHEXOL 180 MG/ML  SOLN
1.0000 mL | Freq: Once | INTRAMUSCULAR | Status: AC | PRN
  Administered 2013-05-09: 1 mL via EPIDURAL

## 2013-05-09 MED ORDER — METHYLPREDNISOLONE ACETATE 40 MG/ML INJ SUSP (RADIOLOG
120.0000 mg | Freq: Once | INTRAMUSCULAR | Status: AC
  Administered 2013-05-09: 120 mg via EPIDURAL

## 2013-05-23 ENCOUNTER — Ambulatory Visit: Payer: 59 | Admitting: Neurology

## 2013-06-13 DIAGNOSIS — M545 Low back pain, unspecified: Secondary | ICD-10-CM | POA: Insufficient documentation

## 2013-08-15 ENCOUNTER — Other Ambulatory Visit: Payer: Self-pay | Admitting: Gastroenterology

## 2013-08-15 ENCOUNTER — Ambulatory Visit
Admission: RE | Admit: 2013-08-15 | Discharge: 2013-08-15 | Disposition: A | Discharge status: Home / Self Care | Payer: 59 | Source: Ambulatory Visit | Attending: Gastroenterology | Admitting: Gastroenterology

## 2013-08-15 DIAGNOSIS — K5900 Constipation, unspecified: Secondary | ICD-10-CM

## 2013-08-29 DIAGNOSIS — B029 Zoster without complications: Secondary | ICD-10-CM | POA: Insufficient documentation

## 2013-11-19 DIAGNOSIS — M5414 Radiculopathy, thoracic region: Secondary | ICD-10-CM | POA: Insufficient documentation

## 2014-02-27 DIAGNOSIS — M5417 Radiculopathy, lumbosacral region: Secondary | ICD-10-CM | POA: Insufficient documentation

## 2014-03-04 ENCOUNTER — Other Ambulatory Visit: Payer: Self-pay

## 2014-05-06 ENCOUNTER — Other Ambulatory Visit: Payer: Self-pay | Admitting: Obstetrics and Gynecology

## 2014-05-07 LAB — CYTOLOGY - PAP

## 2014-09-10 ENCOUNTER — Other Ambulatory Visit (HOSPITAL_COMMUNITY): Payer: Self-pay | Admitting: Neurological Surgery

## 2014-09-10 DIAGNOSIS — M5416 Radiculopathy, lumbar region: Secondary | ICD-10-CM

## 2014-09-23 ENCOUNTER — Ambulatory Visit (HOSPITAL_COMMUNITY)
Admission: RE | Admit: 2014-09-23 | Discharge: 2014-09-23 | Disposition: A | Discharge status: Home / Self Care | Payer: PRIVATE HEALTH INSURANCE | Source: Ambulatory Visit | Attending: Neurological Surgery | Admitting: Neurological Surgery

## 2014-09-23 DIAGNOSIS — M5136 Other intervertebral disc degeneration, lumbar region: Secondary | ICD-10-CM | POA: Diagnosis not present

## 2014-09-23 DIAGNOSIS — M5416 Radiculopathy, lumbar region: Secondary | ICD-10-CM | POA: Diagnosis present

## 2014-09-23 DIAGNOSIS — M79605 Pain in left leg: Secondary | ICD-10-CM | POA: Diagnosis present

## 2014-09-23 DIAGNOSIS — M4806 Spinal stenosis, lumbar region: Secondary | ICD-10-CM | POA: Insufficient documentation

## 2014-09-23 DIAGNOSIS — R252 Cramp and spasm: Secondary | ICD-10-CM | POA: Diagnosis present

## 2014-09-23 DIAGNOSIS — M479 Spondylosis, unspecified: Secondary | ICD-10-CM | POA: Insufficient documentation

## 2014-09-23 LAB — CREATININE, SERUM
CREATININE: 0.73 mg/dL (ref 0.50–1.10)
GFR calc non Af Amer: >90 mL/min (ref >90)

## 2014-09-23 MED ORDER — GADOBENATE DIMEGLUMINE 529 MG/ML IV SOLN
18.0000 mL | Freq: Once | INTRAVENOUS | Status: AC | PRN
  Administered 2014-09-23: 18 mL via INTRAVENOUS

## 2014-09-25 ENCOUNTER — Telehealth: Payer: Self-pay | Admitting: *Deleted

## 2014-09-25 NOTE — Telephone Encounter (Signed)
Pt called to schedule her evaluation. gve an appt for 10/08/14 @ 10:15am . Pt is aware that i will mail her form out to her address...td

## 2014-10-08 ENCOUNTER — Ambulatory Visit: Payer: PRIVATE HEALTH INSURANCE | Attending: Neurological Surgery

## 2014-10-08 DIAGNOSIS — M5416 Radiculopathy, lumbar region: Secondary | ICD-10-CM | POA: Diagnosis present

## 2014-10-08 DIAGNOSIS — R531 Weakness: Secondary | ICD-10-CM | POA: Diagnosis not present

## 2014-10-08 DIAGNOSIS — R293 Abnormal posture: Secondary | ICD-10-CM | POA: Diagnosis not present

## 2014-10-08 DIAGNOSIS — R29898 Other symptoms and signs involving the musculoskeletal system: Secondary | ICD-10-CM

## 2014-10-08 NOTE — Patient Instructions (Addendum)
Wall Lean Stretch   With left hand against wall, slowly stretch hips toward wall, other arm supporting trunk. Hold ____ seconds. Relax. Repeat ____ times per set. Do ____ sets per session. Do ____ sessions per day.  Thoracolumbar Side-Bend: Manual (Standing)   Left hand on hip, other hand at hip crest level, gently press hands together, shifting hips to right . Hold ____ seconds. Relax. Repeat ____ times per set. Do ____ sets per session. Do ____ sessions per day.  Copyright  VHI. All rights reserved.     Elbow Prop (Extension)   Prop body up on elbows for ____ seconds. Slowly lower it. Repeat ____ times. Do ____ sessions per day.  http://gt2.exer.us/243   Copyright  VHI. All rights reserved.  Back Hyperextension: Using Arms   Lying face down with arms bent, inhale. Then while exhaling, straighten arms. Hold ____ seconds. Slowly return to starting position. Repeat ____ times per set. Do ____ sets per session. Do ____ sessions per day. Extension                                                    Also  Good sitting posture with lumbar roll

## 2014-10-08 NOTE — Therapy (Addendum)
Physical Therapy Evaluation  Patient Details  Name: Nicole [name] MRN: [id number] Date of Birth: 12/14/1955  Encounter Date: 10/08/2014      PT End of Session - 10/08/14 1115    Visit Number 1   Number of Visits 12   Date for PT Re-Evaluation 10/07/14   PT Start Time 1020   PT Stop Time 1110   PT Time Calculation (min) 50 min   Activity Tolerance Patient tolerated treatment well   Behavior During Therapy [facility] for tasks assessed/performed      Past Medical History  Diagnosis Date  . Heart burn   . Complication of anesthesia   . PONV (postoperative nausea and vomiting)   . Family history of anesthesia complication     N&V- Mother   . Heart murmur     since birth  . Hypertension     followed by Dr. Marisue Humble   . H/O hiatal hernia     small hiatal hernia, has had endoscopy & colondoscopy  . GERD (gastroesophageal reflux disease)     Chron's disease, not medically treating currently   . Arthritis     lumbar- HNP, /w myelopathy  . Bronchitis     used albuterol , Sept. 2013, all clear now   . Asthma     /w reflux , consult /w Dr. Wert-2013  . Dizziness and giddiness   . Dizziness and giddiness 02/07/2013    Past Surgical History  Procedure Laterality Date  . Cholecystectomy  03    laparoscopic  . Lumbar laminectomy/decompression microdiscectomy  09/10/2012    Procedure: LUMBAR LAMINECTOMY/DECOMPRESSION MICRODISCECTOMY 1 LEVEL;  Surgeon: Kristeen Miss, MD;  Location: [facility];  Service: Neurosurgery;  Laterality: Left;  Left Lumbar Five-Sacral One Microdiscectomy  . Gallbladder surgery  2004  . Back surgery  2013    There were no vitals taken for this visit.  Visit Diagnosis:  Lumbar radiculopathy - Plan: PT plan of care cert/re-cert  Weakness of left lower extremity - Plan: PT plan of care cert/re-cert  Posture abnormality - Plan: PT plan of care cert/re-cert      Subjective Assessment - 10/08/14 1027    Symptoms LEft  lower back and leg pain, weakness  and numbness . Lt lAteral thigh pain with parasthesias into lateral lower leg and whole foot.    Pertinent History She had previous surgery and in 07/2014 she pulled cart at work and she pulled back. She had a fracture table that broke and she had to hold leg fo patient.  she also has had a Lt trochanteric and hip joint injection withminimal improvement   Limitations --  Out of work   How long can you sit comfortably? 3 min   How long can you stand comfortably? 10 minutes   How long can you walk comfortably? 10 minutes   Diagnostic tests MRI  done and will be reviewed with MD today.    Patient Stated Goals Decrease pain ot be able to function again   Currently in Pain? Yes   Pain Score 8    Pain Location Back  and into LT thigh   Pain Orientation Left;Posterior   Pain Descriptors / Indicators Tingling  centre lower back hurting   Pain Type Acute pain   Pain Radiating Towards Left   Pain Onset More than a month ago   Pain Frequency Constant   Aggravating Factors  All activity   Pain Relieving Factors Medications   Effect of Pain on Daily Activities all  Limited   Multiple Pain Sites No          [facility] PT Assessment - 10/08/14 1034    Assessment   Medical Diagnosis lumbar raadiculopathy   Onset Date --  september 21/2015   Next MD Visit 10/08/14   Prior Therapy no   Precautions   Precautions None   Restrictions   Weight Bearing Restrictions --  out of work   Balance Screen   Has the patient fallen in the past 6 months No   Has the patient had a decrease in activity level because of a fear of falling?  No   Is the patient reluctant to leave their home because of a fear of falling?  No   Prior Function   Level of Independence Independent with basic ADLs   Vocation Full time employment   Vocation Requirements Full time RN inOR   Leisure not exerciseeing/walking   AROM   Overall AROM  --  All RT LE movement normal.  All LT LE movement limited    Overall AROM Comments SLR  equal  . She cogwheeled all LT LE  MMT from hip to ankle    Lumbar Flexion She wasable to touch her distal tibias   Lumbar Extension WNl but with rotation ans side lean to rT    Lumbar - Right Side Bend WFL   Lumbar - Left Side Bend Decreased  60%   Bed Mobility   Bed Mobility --  Independent with good movement patterns   Ambulation/Gait   Ambulation/Gait Yes   Ambulation/Gait Assistance 6: Modified independent (Device/Increase time)   Assistive device None   Gait Pattern Lateral trunk lean to right;Decreased trunk rotation;Antalgic  anterior pelvic tilt with LT leg stance            PT Education - 10/08/14 1115    Education provided Yes   Person(s) Educated Patient   Methods Explanation;Demonstration   Comprehension Verbalized understanding;Returned demonstration          PT Short Term Goals - 10/08/14 1120    PT SHORT TERM GOAL #1   Title Demo understanding of good posture   Time 3   Period Weeks   Status New   PT SHORT TERM GOAL #2   Title She will report decr pain in back and thigh 25% or more   Time 3   Period Weeks   Status New   PT SHORT TERM GOAL #3   Title independent with initial HEP   Time 3   Period Weeks   Status New   PT SHORT TERM GOAL #4   Title She will report tingling in lower leg and foot decr 25%    Time 3   Period Weeks   Status New          PT Long Term Goals - 10/08/14 1121    PT LONG TERM GOAL #1   Title Independent with advanceed HEP   Time 6   Period Weeks   Status New   PT LONG TERM GOAL #2   Title She will report decr pain 50% or mroe in back and thigh    Time 6   Period Weeks   Status New   PT LONG TERM GOAL #3   Title Report able to sit for 60 minutes without incr pain   Time 6   Period Weeks   Status New   PT LONG TERM GOAL #4   Title Report able to walk normally for 60  minute withut increased pain   PT LONG TERM GOAL #5   Title Return to work full duty    Time 6   Period Weeks   Status New           Plan - 10/08/14 1116    Clinical Impression Statement Nicole [name] was limited by pain na decreased trunk motion with mild RT lateral shift. She had unusual pattern of resistance with MMT with all muscles of LT LE involved   Pt will benefit from skilled therapeutic intervention in order to improve on the following deficits Pain;Decreased strength;Decreased mobility;Decreased range of motion;Abnormal gait;Decreased activity tolerance   Rehab Potential Good   PT Frequency 2x / week   PT Duration 6 weeks   PT Treatment/Interventions ADLs/Self Care Home Management;Therapeutic activities;Patient/family education;Traction;Moist Heat;Ultrasound;Electrical Stimulation;Therapeutic exercise;Manual techniques;Dry needling;Passive range of motion   PT Next Visit Plan Therapuetic exercise for core stability, LT leg strength and trunk /hip flexibility, Modalities as needed   PT Home Exercise Plan Stabilization and hip strength   Consulted and Agree with Plan of Care Patient        Problem List Patient Active Problem List   Diagnosis Date Noted  . Dizziness and giddiness 02/07/2013  . Cough 09/23/2011  . Hypertension 08/11/2011  . Obesity 08/11/2011                 Physician: Dr Ellene Route  Certification Start Date: 10/93/23 Certification End Date: 11/20/14  Physician Documentation Your signature is required to indicate approval of the treatment plan as stated above.  Please sign and either send electronically or make a copy of this report for your files and return this physician signed original.  Please mark one 1.__approve of plan   2. ___approve of plan with the followingconditions. ____________________________________________________________________________________________________________________________________________   ______________________                                                       _____________________ Physician Signature                                                                      Date    Faxed to MD for signature                             Darrel Hoover  PT 10/08/2014, 11:26 AM

## 2014-10-16 ENCOUNTER — Ambulatory Visit: Payer: PRIVATE HEALTH INSURANCE | Attending: Family Medicine | Admitting: Rehabilitation

## 2014-10-16 ENCOUNTER — Encounter: Payer: Self-pay | Admitting: Rehabilitation

## 2014-10-16 DIAGNOSIS — R29898 Other symptoms and signs involving the musculoskeletal system: Secondary | ICD-10-CM | POA: Insufficient documentation

## 2014-10-16 DIAGNOSIS — M5416 Radiculopathy, lumbar region: Secondary | ICD-10-CM | POA: Diagnosis not present

## 2014-10-16 DIAGNOSIS — R293 Abnormal posture: Secondary | ICD-10-CM | POA: Diagnosis not present

## 2014-10-16 NOTE — Patient Instructions (Signed)
Piriformis Stretch, Supine   Lie supine, one ankle crossed onto opposite knee. Holding bottom leg behind knee, gently pull legs toward chest until stretch is felt in buttock of top leg. Hold _30__ seconds. For deeper stretch gently push top knee away from body.  Repeat _3__ times per session. Do 2___ sessions per day.  Copyright  VHI. All rights reserved.  Hamstring Stretch   With other leg bent, foot flat, grasp right leg and slowly try to straighten knee. Hold _30___ seconds. Repeat _3___ times. Do __2__ sessions per day.  http://gt2.exer.us/280   Copyright  VHI. All rights reserved.  HIP: Flexors - Supine   Lie on edge of surface. Place leg off the surface, allow knee to bend. Bring other knee toward chest. Hold _30__ seconds. _3__ reps per set, _2 times per day ___   Copyright  VHI. All rights reserved.

## 2014-10-16 NOTE — Therapy (Signed)
Outpatient Rehabilitation [facility] [address] [place], [place], [address] Phone: [phone]   Fax:  [phone]  Physical Therapy Treatment  Patient Details  Name: Nicole [name] MRN: [id number] Date of Birth: 1956-08-18  Encounter Date: 10/16/2014      PT End of Session - 10/16/14 1609    Visit Number 2   Number of Visits 12   Date for PT Re-Evaluation 11/19/14   PT Start Time 0305   PT Stop Time 0410   PT Time Calculation (min) 65 min      Past Medical History  Diagnosis Date  . Heart burn   . Complication of anesthesia   . PONV (postoperative nausea and vomiting)   . Family history of anesthesia complication     N&V- Mother   . Heart murmur     since birth  . Hypertension     followed by Dr. Marisue Humble   . H/O hiatal hernia     small hiatal hernia, has had endoscopy & colondoscopy  . GERD (gastroesophageal reflux disease)     Chron's disease, not medically treating currently   . Arthritis     lumbar- HNP, /w myelopathy  . Bronchitis     used albuterol , Sept. 2013, all clear now   . Asthma     /w reflux , consult /w Dr. Wert-2013  . Dizziness and giddiness   . Dizziness and giddiness 02/07/2013    Past Surgical History  Procedure Laterality Date  . Cholecystectomy  03    laparoscopic  . Lumbar laminectomy/decompression microdiscectomy  09/10/2012    Procedure: LUMBAR LAMINECTOMY/DECOMPRESSION MICRODISCECTOMY 1 LEVEL;  Surgeon: Kristeen Miss, MD;  Location: [facility];  Service: Neurosurgery;  Laterality: Left;  Left Lumbar Five-Sacral One Microdiscectomy  . Gallbladder surgery  2004  . Back surgery  2013    There were no vitals taken for this visit.  Visit Diagnosis:  Lumbar radiculopathy  Weakness of left lower extremity  Posture abnormality      Subjective Assessment - 10/16/14 1525    Symptoms Pt reports no improvement noticed with HEP thus far   Pain Score 8    Pain Location Back  left superior glute   Pain Orientation  Left   Pain Descriptors / Indicators Sore;Tender   Aggravating Factors  transition and weight bearing on left   Pain Relieving Factors not much              PT Education - 10/16/14 1552    Education provided Yes   Education Details How to use a SPC   Person(s) Educated Patient   Methods Handout   Comprehension Verbalized understanding          PT Short Term Goals - 10/16/14 1619    PT SHORT TERM GOAL #1   Title Demo understanding of good posture   Time 3   Period Weeks   Status On-going   PT SHORT TERM GOAL #2   Title She will report decr pain in back and thigh 25% or more   Time 3   Period Weeks   Status On-going   PT SHORT TERM GOAL #3   Title independent with initial HEP   Time 3   Period Weeks   Status On-going   PT SHORT TERM GOAL #4   Title She will report tingling in lower leg and foot decr 25%    Time 3   Period Weeks   Status On-going  PT Long Term Goals - 10/16/14 1620    PT LONG TERM GOAL #1   Title Independent with advanceed HEP   Time 6   Period Weeks   Status On-going   PT LONG TERM GOAL #2   Title She will report decr pain 50% or mroe in back and thigh    Time 6   Period Weeks   Status On-going   PT LONG TERM GOAL #3   Title Report able to sit for 60 minutes without incr pain   Time 6   Period Weeks   Status On-going   PT LONG TERM GOAL #4   Title Report able to walk normally for 60 minute withut increased pain   Time 6   Period Weeks   Status On-going   PT LONG TERM GOAL #5   Title Return to work full duty    Time 6   Period Weeks   Status On-going          Plan - 10/16/14 1613    Clinical Impression Statement Pt reports MRI was negative for anything new. 0/10 pain after treatment today   PT Next Visit Plan Therapuetic exercise for core stability, LT leg strength and trunk /hip flexibility, Modalities as needed       Problem List Patient Active Problem List   Diagnosis Date Noted  . Dizziness and  giddiness 02/07/2013  . Cough 09/23/2011  . Hypertension 08/11/2011  . Obesity 08/11/2011    Dorene Ar, PTA 10/16/2014, 4:21 PM

## 2014-10-21 ENCOUNTER — Ambulatory Visit: Payer: PRIVATE HEALTH INSURANCE | Admitting: Rehabilitation

## 2014-10-21 DIAGNOSIS — M5416 Radiculopathy, lumbar region: Secondary | ICD-10-CM | POA: Diagnosis not present

## 2014-10-21 DIAGNOSIS — R293 Abnormal posture: Secondary | ICD-10-CM

## 2014-10-21 DIAGNOSIS — R29898 Other symptoms and signs involving the musculoskeletal system: Secondary | ICD-10-CM

## 2014-10-21 NOTE — Therapy (Signed)
Outpatient Rehabilitation [facility] [address] [place], [place], [address] Phone: [phone]   Fax:  [phone]  Physical Therapy Treatment  Patient Details  Name: Nicole [name] MRN: [id number] Date of Birth: December 15, 1955  Encounter Date: 10/21/2014      PT End of Session - 10/21/14 1251    Visit Number 3   Number of Visits 12   Date for PT Re-Evaluation 11/19/14   PT Start Time 3151   PT Stop Time 1250   PT Time Calculation (min) 65 min      Past Medical History  Diagnosis Date  . Heart burn   . Complication of anesthesia   . PONV (postoperative nausea and vomiting)   . Family history of anesthesia complication     N&V- Mother   . Heart murmur     since birth  . Hypertension     followed by Dr. Marisue Humble   . H/O hiatal hernia     small hiatal hernia, has had endoscopy & colondoscopy  . GERD (gastroesophageal reflux disease)     Chron's disease, not medically treating currently   . Arthritis     lumbar- HNP, /w myelopathy  . Bronchitis     used albuterol , Sept. 2013, all clear now   . Asthma     /w reflux , consult /w Dr. Wert-2013  . Dizziness and giddiness   . Dizziness and giddiness 02/07/2013    Past Surgical History  Procedure Laterality Date  . Cholecystectomy  03    laparoscopic  . Lumbar laminectomy/decompression microdiscectomy  09/10/2012    Procedure: LUMBAR LAMINECTOMY/DECOMPRESSION MICRODISCECTOMY 1 LEVEL;  Surgeon: Kristeen Miss, MD;  Location: [facility];  Service: Neurosurgery;  Laterality: Left;  Left Lumbar Five-Sacral One Microdiscectomy  . Gallbladder surgery  2004  . Back surgery  2013    There were no vitals taken for this visit.  Visit Diagnosis:  Lumbar radiculopathy  Weakness of left lower extremity  Posture abnormality      Subjective Assessment - 10/21/14 1149    Symptoms pt reports she felt great after last appointment until she went home and attempted stretching in the floor later in the evening   Currently in Pain? Yes   Pain Score 6    Pain Location Back  left superior glut   Pain Descriptors / Indicators Tender;Sore   Pain Type Acute pain   Aggravating Factors  weight bearing on left   Pain Relieving Factors E stim            [facility] Adult PT Treatment/Exercise - 10/21/14 1202    Lumbar Exercises: Supine   Other Supine Lumbar Exercises Reformer Foot WORK Parallel balls, and heels, ER balls and heels, wide parallel on heels, ER on heels, prancing, calf raises, stretch, bridge with ball squeeze with and without foot bar    Knee/Hip Exercises: Stretches   Active Hamstring Stretch 30 seconds;3 reps   Hip Flexor Stretch 30 seconds;3 reps   Piriformis Stretch 30 seconds;3 reps   Knee/Hip Exercises: Prone   Straight Leg Raises 10 reps  alternating with core engaged   Other Prone Exercises heel squeezes with glut squeeze x 10, Hip IR/ER AROM x 20   Modalities   Modalities Electrical Stimulation;Moist Heat   Moist Heat Therapy   Number Minutes Moist Heat 15 Minutes   Moist Heat Location --  lumbar/left hip   Electrical Stimulation   Electrical Stimulation Location low back left hip   Electrical Stimulation Action IFC  Electrical Stimulation Parameters to tolerance 19   Electrical Stimulation Goals Pain                Plan - 10/21/14 1243    Clinical Impression Statement Minimal progress toward goals, limited visits   PT Next Visit Plan continue reformer exercises for core and LE strengthening/flexibility       Problem List Patient Active Problem List   Diagnosis Date Noted  . Dizziness and giddiness 02/07/2013  . Cough 09/23/2011  . Hypertension 08/11/2011  . Obesity 08/11/2011    Dorene Ar , PTA  10/21/2014, 5:29 PM

## 2014-10-24 ENCOUNTER — Ambulatory Visit: Payer: PRIVATE HEALTH INSURANCE

## 2014-10-24 DIAGNOSIS — M5416 Radiculopathy, lumbar region: Secondary | ICD-10-CM | POA: Diagnosis not present

## 2014-10-24 DIAGNOSIS — R29898 Other symptoms and signs involving the musculoskeletal system: Secondary | ICD-10-CM

## 2014-10-24 NOTE — Therapy (Signed)
Outpatient Rehabilitation [facility] [address] [place], [place], [address] Phone: [phone]   Fax:  [phone]  Physical Therapy Treatment  Patient Details  Name: Nicole [name] MRN: [id number] Date of Birth: [birth year]  Encounter Date: 10/24/2014      PT End of Session - 10/24/14 1038    Visit Number 4   Number of Visits 12   Date for PT Re-Evaluation 11/19/14   PT Start Time 1000   PT Stop Time 1040   PT Time Calculation (min) 40 min   Activity Tolerance --  She reported decreased pain and improved movement      Past Medical History  Diagnosis Date  . Heart burn   . Complication of anesthesia   . PONV (postoperative nausea and vomiting)   . Family history of anesthesia complication     N&V- Mother   . Heart murmur     since birth  . Hypertension     followed by Dr. Marisue Humble   . H/O hiatal hernia     small hiatal hernia, has had endoscopy & colondoscopy  . GERD (gastroesophageal reflux disease)     Chron's disease, not medically treating currently   . Arthritis     lumbar- HNP, /w myelopathy  . Bronchitis     used albuterol , Sept. 2013, all clear now   . Asthma     /w reflux , consult /w Dr. Wert-2013  . Dizziness and giddiness   . Dizziness and giddiness 02/07/2013    Past Surgical History  Procedure Laterality Date  . Cholecystectomy  03    laparoscopic  . Lumbar laminectomy/decompression microdiscectomy  09/10/2012    Procedure: LUMBAR LAMINECTOMY/DECOMPRESSION MICRODISCECTOMY 1 LEVEL;  Surgeon: Kristeen Miss, MD;  Location: [facility];  Service: Neurosurgery;  Laterality: Left;  Left Lumbar Five-Sacral One Microdiscectomy  . Gallbladder surgery  2004  . Back surgery  2013    There were no vitals taken for this visit.  Visit Diagnosis:  Lumbar radiculopathy  Weakness of left lower extremity      Subjective Assessment - 10/24/14 1005    Symptoms She was at Dr Alvan Dame since last visit and he told Pt she needs a total LT hip  replacement . MRI was done and no changes noted.  Numbness in leg improved   Currently in Pain? Yes   Pain Score 8    Pain Location Hip   Pain Orientation Left   Pain Descriptors / Indicators Burning   Pain Type Acute pain   Pain Onset In the past 7 days   Pain Frequency Intermittent   Aggravating Factors  Weight bearing on LT   Pain Relieving Factors Rest, Meds don't help with pain   Effect of Pain on Daily Activities All Limited , walks with cane   Multiple Pain Sites No            [facility] Adult PT Treatment/Exercise - 10/24/14 1010    Exercises   Exercises Lumbar   Lumbar Exercises: Stretches   Single Knee to Chest Stretch 2 reps;30 seconds   Piriformis Stretch 2 reps;30 seconds   Lumbar Exercises: Supine   Bent Knee Raise 3 seconds  8 repos RT and Lt with abdominal assist   Straight Leg Raise 10 reps;3 seconds  Verbal cues to assist SLR with abdominals   Lumbar Exercises: Sidelying   Clam 10 reps;3 seconds   Hip Abduction 10 reps;2 seconds   Lumbar Exercises: Prone   Other Prone Lumbar Exercises Hip  extension     Manual Therapy   Manual Therapy --  STW and release to LT hip rot with LE rotation, andtraction           PT Education - 10/24/14 1010    Education provided Yes   Education Details gentle rotation to LT hip for at home   Person(s) Educated Patient   Methods Explanation;Demonstration;Verbal cues   Comprehension Verbalized understanding;Returned demonstration              Plan - 10/24/14 1044    Clinical Impression Statement Hip thigh pain limiting pt. Her numbness is improved and hip appears to be limiting her functionally   Pt will benefit from skilled therapeutic intervention in order to improve on the following deficits Pain;Decreased strength;Decreased mobility;Decreased range of motion;Abnormal gait;Decreased activity tolerance   Rehab Potential Fair   PT Frequency 2x / week   PT Duration 4 weeks   PT Treatment/Interventions ADLs/Self  Care Home Management;Therapeutic activities;Patient/family education;Traction;Moist Heat;Ultrasound;Electrical Stimulation;Therapeutic exercise;Manual techniques;Dry needling;Passive range of motion   PT Next Visit Plan Continue exercises for core and hip strength adn manual techniques and modalities as needed   PT Home Exercise Plan Stabilization and hip strength   Consulted and Agree with Plan of Care Patient                               Problem List Patient Active Problem List   Diagnosis Date Noted  . Dizziness and giddiness 02/07/2013  . Cough 09/23/2011  . Hypertension 08/11/2011  . Obesity 08/11/2011    Darrel Hoover PT 10/24/2014, 10:46 AM

## 2014-10-27 ENCOUNTER — Ambulatory Visit: Payer: PRIVATE HEALTH INSURANCE

## 2014-10-27 ENCOUNTER — Telehealth: Payer: Self-pay | Admitting: *Deleted

## 2014-10-27 DIAGNOSIS — R29898 Other symptoms and signs involving the musculoskeletal system: Secondary | ICD-10-CM

## 2014-10-27 DIAGNOSIS — M25552 Pain in left hip: Secondary | ICD-10-CM

## 2014-10-27 DIAGNOSIS — M5416 Radiculopathy, lumbar region: Secondary | ICD-10-CM

## 2014-10-27 NOTE — Therapy (Signed)
Outpatient Rehabilitation [facility] [address] [place], [place], [address] Phone: [phone]   Fax:  [phone]  Physical Therapy Treatment  Patient Details  Name: Nicole [name] MRN: [id number] Date of Birth: June 07, 1956  Encounter Date: 10/27/2014      PT End of Session - 10/27/14 1139    Visit Number 5   Number of Visits 12   Date for PT Re-Evaluation 11/19/14   PT Start Time 1105   PT Stop Time 1208   PT Time Calculation (min) 63 min   Activity Tolerance Patient tolerated treatment well   Behavior During Therapy [facility] for tasks assessed/performed      Past Medical History  Diagnosis Date  . Heart burn   . Complication of anesthesia   . PONV (postoperative nausea and vomiting)   . Family history of anesthesia complication     N&V- Mother   . Heart murmur     since birth  . Hypertension     followed by Dr. Marisue Humble   . H/O hiatal hernia     small hiatal hernia, has had endoscopy & colondoscopy  . GERD (gastroesophageal reflux disease)     Chron's disease, not medically treating currently   . Arthritis     lumbar- HNP, /w myelopathy  . Bronchitis     used albuterol , Sept. 2013, all clear now   . Asthma     /w reflux , consult /w Dr. Wert-2013  . Dizziness and giddiness   . Dizziness and giddiness 02/07/2013    Past Surgical History  Procedure Laterality Date  . Cholecystectomy  03    laparoscopic  . Lumbar laminectomy/decompression microdiscectomy  09/10/2012    Procedure: LUMBAR LAMINECTOMY/DECOMPRESSION MICRODISCECTOMY 1 LEVEL;  Surgeon: Kristeen Miss, MD;  Location: [facility];  Service: Neurosurgery;  Laterality: Left;  Left Lumbar Five-Sacral One Microdiscectomy  . Gallbladder surgery  2004  . Back surgery  2013    There were no vitals taken for this visit.  Visit Diagnosis:  Lumbar radiculopathy  Weakness of left lower extremity  Pain in joint, pelvic region and thigh, left      Subjective Assessment - 10/27/14 1102    Symptoms She reports no improvement   Currently in Pain? Yes   Pain Score 7   Lest pain 5/10   Pain Location Hip   Pain Orientation Left   Pain Descriptors / Indicators Burning   Pain Type Acute pain   Pain Radiating Towards LEFT   Pain Onset 1 to 4 weeks ago   Pain Frequency Constant   Aggravating Factors  Weight bearing LT leg   Pain Relieving Factors Rest meds   Effect of Pain on Daily Activities ALl lmited activity   Multiple Pain Sites No            [facility] Adult PT Treatment/Exercise - 10/27/14 1129    Lumbar Exercises: Aerobic   Stationary Bike LI 7 minutes    Moist Heat Therapy   Number Minutes Moist Heat 12 Minutes   Moist Heat Location --  LT hip   Ultrasound   Ultrasound Location LT hip   Ultrasound Parameters 100% 1.8Wcm2, 1 MHz   Ultrasound Goals Pain          PT Education - 10/27/14 1211    Education provided Yes   Education Details hip exercises   Person(s) Educated Patient   Methods Explanation;Demonstration;Tactile cues;Verbal cues;Handout   Comprehension Verbalized understanding;Returned demonstration     Emphasized that pain is her  guide to continuing her HEP         Plan - 10/27/14 1143    Clinical Impression Statement No improvement in LT hip pain. Numbness /tingling still improving   Pt will benefit from skilled therapeutic intervention in order to improve on the following deficits Difficulty walking;Pain;Decreased strength;Decreased range of motion   Rehab Potential Fair   PT Frequency 1x / week   PT Next Visit Plan Continue exercises for core and hip strength adn manual techniques and modalities as needed   PT Home Exercise Plan Stabilization and hip strength   Consulted and Agree with Plan of Care Patient                               Problem List Patient Active Problem List   Diagnosis Date Noted  . Dizziness and giddiness 02/07/2013  . Cough 09/23/2011  . Hypertension 08/11/2011  . Obesity  08/11/2011    Darrel Hoover PT 10/27/2014, 12:13 PM

## 2014-10-27 NOTE — Telephone Encounter (Signed)
APPTS MADE AND PRINTED...TD 

## 2014-10-27 NOTE — Patient Instructions (Signed)
Hip Extension: Hamstring Single Leg Deadlift (Eccentric)   Holding weights, stand on affected leg with knee slightly flexed. Lift other leg while slowly bending forward at the hip. Use ___ lb weight. ___ reps per set, ___ sets per day, ___ days per week. Add ___ lbs when you achieve ___ repetitions. Touch floor with weight.  Copyright  VHI. All rights reserved.  Squat: Wide Leg   Feet wide apart, toes out, squat, holding weight between knees. Weight should be at ankles.  Bend at the hips with hips going back behind hips. Keep back straight.  Put eight on stool if you cannot get to floor.  Repeat _3-15___ times per set. Do ___1-2_ sets per session. Do __2-5__ sessions per week. Use __0-10__ lb weight.  Copyright  VHI. All rights reserved.  Squat: Half   Arms hanging at sides, squat by dropping hips back as if sitting on a chair. Keep knees over ankles, hips behind heels.  Keep back straight.  Bend at hips and knees will bend with hips.   Repeat ___3-15_ times per set. Do __1-2__ sets per session. Do __2-5__ sessions per week. Use __0-10HIP / KNEE: Flexion / Extension, Squat Unilateral Hip / Glute Extension: Standing - Straight Leg (Machine) Hip Extension (Standing) Healthy Back - Yoga Tree Balance Wall Sit   Back against wall, slide down so knees are at 90 angle. Hold ____ seconds. Do ____ sets. Complete ____ repetitions.  http://st.exer.us/295   Copyright  VHI. All rights reserved.

## 2014-10-29 ENCOUNTER — Ambulatory Visit: Payer: PRIVATE HEALTH INSURANCE

## 2014-10-29 DIAGNOSIS — R29898 Other symptoms and signs involving the musculoskeletal system: Secondary | ICD-10-CM

## 2014-10-29 DIAGNOSIS — M5416 Radiculopathy, lumbar region: Secondary | ICD-10-CM | POA: Diagnosis not present

## 2014-10-29 DIAGNOSIS — M25552 Pain in left hip: Secondary | ICD-10-CM

## 2014-10-29 NOTE — Therapy (Signed)
Outpatient Rehabilitation [facility] [address] [place], [place], [address] Phone: [phone]   Fax:  [phone]  Physical Therapy Treatment  Patient Details  Name: Nicole [name] MRN: [id number] Date of Birth: February 07, 1956  Encounter Date: 10/29/2014      PT End of Session - 10/29/14 1057    Visit Number 6   Number of Visits 12   Date for PT Re-Evaluation 11/19/14   PT Start Time 0160   PT Stop Time 1110   PT Time Calculation (min) 55 min   Activity Tolerance Patient tolerated treatment well   Behavior During Therapy [facility] for tasks assessed/performed      Past Medical History  Diagnosis Date  . Heart burn   . Complication of anesthesia   . PONV (postoperative nausea and vomiting)   . Family history of anesthesia complication     N&V- Mother   . Heart murmur     since birth  . Hypertension     followed by Dr. Marisue Humble   . H/O hiatal hernia     small hiatal hernia, has had endoscopy & colondoscopy  . GERD (gastroesophageal reflux disease)     Chron's disease, not medically treating currently   . Arthritis     lumbar- HNP, /w myelopathy  . Bronchitis     used albuterol , Sept. 2013, all clear now   . Asthma     /w reflux , consult /w Dr. Wert-2013  . Dizziness and giddiness   . Dizziness and giddiness 02/07/2013    Past Surgical History  Procedure Laterality Date  . Cholecystectomy  03    laparoscopic  . Lumbar laminectomy/decompression microdiscectomy  09/10/2012    Procedure: LUMBAR LAMINECTOMY/DECOMPRESSION MICRODISCECTOMY 1 LEVEL;  Surgeon: Kristeen Miss, MD;  Location: [facility];  Service: Neurosurgery;  Laterality: Left;  Left Lumbar Five-Sacral One Microdiscectomy  . Gallbladder surgery  2004  . Back surgery  2013    There were no vitals taken for this visit.  Visit Diagnosis:  Lumbar radiculopathy  Weakness of left lower extremity  Pain in joint, pelvic region and thigh, left      Subjective Assessment - 10/29/14 1018    Symptoms No better.  Did well after last visit most of day.   Currently in Pain? Yes   Pain Score 7    Pain Location Hip   Pain Orientation Left   Pain Descriptors / Indicators Burning   Pain Type Acute pain   Pain Radiating Towards left   Pain Onset 1 to 4 weeks ago   Pain Frequency Constant   Aggravating Factors  Weight and lifting LT leg   Pain Relieving Factors Rest , meds   Effect of Pain on Daily Activities All activity limited   Multiple Pain Sites No            [facility] Adult PT Treatment/Exercise - 10/29/14 1029    Exercises   Exercises Knee/Hip   Knee/Hip Exercises: Supine   Straight Leg Raises AROM;2 sets;10 reps   Knee/Hip Exercises: Sidelying   Hip ABduction 2 sets;Left;AROM;10 reps   Clams 10 reps x2 with tactile and veral cues  for pelvic alighment.    Knee/Hip Exercises: Prone   Hamstring Curl 20 reps   Hip Extension AROM;20 reps   Modalities   Modalities Ultrasound;Moist Heat   Moist Heat Therapy   Number Minutes Moist Heat 15 Minutes   Moist Heat Location --  Lt hip   Ultrasound   Ultrasound Location Lt hip  Ultrasound Parameters 100% 1.8 Wcm2, 1Mhz   Ultrasound Goals Pain            PT Short Term Goals - 10/29/14 1058    PT SHORT TERM GOAL #1   Title Demo understanding of good posture   Status Achieved   PT SHORT TERM GOAL #2   Title She will report decr pain in back and thigh 25% or more   Status On-going   PT SHORT TERM GOAL #3   Title independent with initial HEP   Status Achieved   PT SHORT TERM GOAL #4   Status Achieved          PT Long Term Goals - 10/29/14 1058    PT LONG TERM GOAL #1   Title Independent with advanceed HEP   Status On-going   PT LONG TERM GOAL #2   Title She will report decr pain 50% or mroe in back and thigh    Status On-going   PT LONG TERM GOAL #4   Title Report able to walk normally for 60 minute withut increased pain   Status On-going   PT LONG TERM GOAL #5   Title Return to work full duty     Status On-going          Plan - 10/29/14 1057    Clinical Impression Statement She was able to do exercise without incr pain and we used modalities as she felt this helped last time   PT Treatment/Interventions ADLs/Self Care Home Management;Therapeutic activities;Patient/family education;Traction;Moist Heat;Ultrasound;Electrical Stimulation;Therapeutic exercise;Manual techniques;Dry needling;Passive range of motion   PT Next Visit Plan Continue exercises for core and hip strength adn manual techniques and modalities as needed   PT Home Exercise Plan Stabilization and hip strength   Consulted and Agree with Plan of Care Patient                               Problem List Patient Active Problem List   Diagnosis Date Noted  . Dizziness and giddiness 02/07/2013  . Cough 09/23/2011  . Hypertension 08/11/2011  . Obesity 08/11/2011    Darrel Hoover PT 10/29/2014, 11:00 AM

## 2014-11-04 ENCOUNTER — Ambulatory Visit: Payer: PRIVATE HEALTH INSURANCE

## 2014-11-04 DIAGNOSIS — M5416 Radiculopathy, lumbar region: Secondary | ICD-10-CM | POA: Diagnosis not present

## 2014-11-04 DIAGNOSIS — R29898 Other symptoms and signs involving the musculoskeletal system: Secondary | ICD-10-CM

## 2014-11-04 DIAGNOSIS — R293 Abnormal posture: Secondary | ICD-10-CM

## 2014-11-04 DIAGNOSIS — M25552 Pain in left hip: Secondary | ICD-10-CM

## 2014-11-04 NOTE — Therapy (Signed)
[place], [place], [address] Phone: [phone]   Fax:  [phone]  Physical Therapy Treatment  Patient Details  Name: Nicole [name] MRN: [id number] Date of Birth: [birth year]  Encounter Date: 11/04/2014      PT End of Session - 11/04/14 1140    Activity Tolerance Patient tolerated treatment well;Patient limited by pain   Behavior During Therapy [facility] for tasks assessed/performed      Past Medical History  Diagnosis Date  . Heart burn   . Complication of anesthesia   . PONV (postoperative nausea and vomiting)   . Family history of anesthesia complication     N&V- Mother   . Heart murmur     since birth  . Hypertension     followed by Dr. Marisue Humble   . H/O hiatal hernia     small hiatal hernia, has had endoscopy & colondoscopy  . GERD (gastroesophageal reflux disease)     Chron's disease, not medically treating currently   . Arthritis     lumbar- HNP, /w myelopathy  . Bronchitis     used albuterol , Sept. 2013, all clear now   . Asthma     /w reflux , consult /w Dr. Wert-2013  . Dizziness and giddiness   . Dizziness and giddiness 02/07/2013    Past Surgical History  Procedure Laterality Date  . Cholecystectomy  03    laparoscopic  . Lumbar laminectomy/decompression microdiscectomy  09/10/2012    Procedure: LUMBAR LAMINECTOMY/DECOMPRESSION MICRODISCECTOMY 1 LEVEL;  Surgeon: Kristeen Miss, MD;  Location: [place] [facility];  Service: Neurosurgery;  Laterality: Left;  Left Lumbar Five-Sacral One Microdiscectomy  . Gallbladder surgery  2004  . Back surgery  2013    There were no vitals taken for this visit.  Visit Diagnosis:  Lumbar radiculopathy  Weakness of left lower extremity  Pain in joint, pelvic region and thigh, left  Posture abnormality      Subjective Assessment - 11/04/14 1051    Symptoms No better . Bad night with pain   Currently in Pain? Yes   Pain Score 7    Pain  Location Hip   Pain Orientation Left   Pain Descriptors / Indicators Burning   Pain Type Acute pain   Pain Onset 1 to 4 weeks ago   Pain Frequency Constant   Aggravating Factors  Weight bearing and lifting leg   Pain Relieving Factors Rest , meds   Effect of Pain on Daily Activities All activity limited   Multiple Pain Sites No                    [facility] Adult PT Treatment/Exercise - 11/04/14 1053    Knee/Hip Exercises: Aerobic   Stationary Bike L2 10 minutes   Knee/Hip Exercises: Supine   Straight Leg Raises Strengthening;1 set;Left  with knee bent and 2 pound on ankle   Knee/Hip Exercises: Sidelying   Hip ABduction Left;2 sets;15 reps;Strengthening   Clams 15    Knee/Hip Exercises: Prone   Hamstring Curl 20 reps  2 pounds   Hip Extension Strengthening;Left;10 reps;3 sets   Hip Extension Limitations 2 pounds   Moist Heat Therapy   Number Minutes Moist Heat 15 Minutes   Moist Heat Location --  LT hip sidelying   Ultrasound   Ultrasound Location LT hip   Ultrasound Parameters 100%, 1MHZ, 1.8 Wcm2   Ultrasound Goals Pain   Manual Therapy   Manual Therapy --  STW to  Lt hip . Most tender ant aspec TFL area                  PT Short Term Goals - 11/04/14 1142    PT SHORT TERM GOAL #1   Title Demo understanding of good posture   Status Achieved   PT SHORT TERM GOAL #2   Title She will report decr pain in back and thigh 25% or more   Baseline In back but hip still painful due to OA   Status Achieved   PT SHORT TERM GOAL #3   Title independent with initial HEP   Status Achieved   PT SHORT TERM GOAL #4   Title She will report tingling in lower leg and foot decr 25%    Status Achieved           PT Long Term Goals - 11/04/14 1143    PT LONG TERM GOAL #1   Title Independent with advanceed HEP   Status On-going   PT LONG TERM GOAL #2   Title She will report decr pain 50% or mroe in back and thigh    Baseline In back but not thigh/hip   Status  On-going   PT LONG TERM GOAL #3   Title Report able to sit for 60 minutes without incr pain   Status On-going   PT LONG TERM GOAL #4   Title Report able to walk normally for 60 minute withut increased pain   Status On-going               Plan - 11/04/14 1141    Clinical Impression Statement She is still limited with pain nad decrease motion LT hip.    Pt will benefit from skilled therapeutic intervention in order to improve on the following deficits Difficulty walking;Pain;Decreased strength;Decreased range of motion   Rehab Potential Fair   PT Treatment/Interventions ADLs/Self Care Home Management;Therapeutic activities;Patient/family education;Traction;Moist Heat;Ultrasound;Electrical Stimulation;Therapeutic exercise;Manual techniques;Dry needling;Passive range of motion   PT Next Visit Plan Continue exercises for core and hip strength adn manual techniques and modalities as needed.Request extension   PT Home Exercise Plan Stabilization and hip strength   Consulted and Agree with Plan of Care Patient        Problem List Patient Active Problem List   Diagnosis Date Noted  . Dizziness and giddiness 02/07/2013  . Cough 09/23/2011  . Hypertension 08/11/2011  . Obesity 08/11/2011    Darrel Hoover PT 11/04/2014, 11:47 AM  [facility] [address] [place], [place], [address] Phone: [phone]   Fax:  [phone]   Physician: Kristeen Miss D  Certification Start YWVP:71/06/26 Certification End Date: 11/19/14  Physician Documentation Your signature is required to indicate approval of the treatment plan as stated above.  Please sign and either send electronically or make a copy of this report for your files and return this physician signed original.  Please mark one 1.__approve of plan   2. ___approve of plan with the followingconditions.  ____________________________________________________________________________________________________________________________________________   ______________________                                                       _____________________ Physician Signature  Date    Faxed to MD for signature  Treatment will emphasis LT hip strength and pain control

## 2014-11-17 ENCOUNTER — Ambulatory Visit: Payer: PRIVATE HEALTH INSURANCE | Attending: Family Medicine

## 2014-11-17 DIAGNOSIS — M5416 Radiculopathy, lumbar region: Secondary | ICD-10-CM | POA: Insufficient documentation

## 2014-11-17 DIAGNOSIS — R293 Abnormal posture: Secondary | ICD-10-CM | POA: Insufficient documentation

## 2014-11-17 DIAGNOSIS — R29898 Other symptoms and signs involving the musculoskeletal system: Secondary | ICD-10-CM | POA: Insufficient documentation

## 2014-11-18 ENCOUNTER — Ambulatory Visit: Payer: PRIVATE HEALTH INSURANCE | Admitting: Rehabilitation

## 2014-11-18 DIAGNOSIS — M5416 Radiculopathy, lumbar region: Secondary | ICD-10-CM | POA: Diagnosis not present

## 2014-11-18 DIAGNOSIS — R29898 Other symptoms and signs involving the musculoskeletal system: Secondary | ICD-10-CM

## 2014-11-18 DIAGNOSIS — M25552 Pain in left hip: Secondary | ICD-10-CM

## 2014-11-18 DIAGNOSIS — R293 Abnormal posture: Secondary | ICD-10-CM | POA: Diagnosis not present

## 2014-11-18 NOTE — Therapy (Signed)
[place], [place], [address] Phone: [phone]   Fax:  [phone]  Physical Therapy Treatment  Patient Details  Name: Nicole [name] MRN: [id number] Date of Birth: [birth year]  Encounter Date: 11/18/2014      PT End of Session - 11/18/14 1611    Visit Number 8   Number of Visits 12   Date for PT Re-Evaluation 11/19/14   PT Start Time 0315   PT Stop Time 0420   PT Time Calculation (min) 65 min      Past Medical History  Diagnosis Date  . Heart burn   . Complication of anesthesia   . PONV (postoperative nausea and vomiting)   . Family history of anesthesia complication     N&V- Mother   . Heart murmur     since birth  . Hypertension     followed by Dr. Marisue Humble   . H/O hiatal hernia     small hiatal hernia, has had endoscopy & colondoscopy  . GERD (gastroesophageal reflux disease)     Chron's disease, not medically treating currently   . Arthritis     lumbar- HNP, /w myelopathy  . Bronchitis     used albuterol , Sept. 2013, all clear now   . Asthma     /w reflux , consult /w Dr. Wert-2013  . Dizziness and giddiness   . Dizziness and giddiness 02/07/2013    Past Surgical History  Procedure Laterality Date  . Cholecystectomy  03    laparoscopic  . Lumbar laminectomy/decompression microdiscectomy  09/10/2012    Procedure: LUMBAR LAMINECTOMY/DECOMPRESSION MICRODISCECTOMY 1 LEVEL;  Surgeon: Kristeen Miss, MD;  Location: [place] [facility];  Service: Neurosurgery;  Laterality: Left;  Left Lumbar Five-Sacral One Microdiscectomy  . Gallbladder surgery  2004  . Back surgery  2013    There were no vitals taken for this visit.  Visit Diagnosis:  Lumbar radiculopathy  Weakness of left lower extremity  Pain in joint, pelvic region and thigh, left  Posture abnormality      Subjective Assessment - 11/18/14 1541    Symptoms Left lateral hip, groin pain and left posterior buttock pain. MD released  to light duty with 30# lifting restriction and no repetitve bending and stooping. No set date to return to work yet.    Pertinent History She had previous surgery and in 07/2014 she pulled cart at work and she pulled back. She had a fracture table that broke and she had to hold leg fo patient.  she also has had a Lt trochanteric and hip joint injection withminimal improvement   Currently in Pain? Yes   Pain Score 6    Pain Location Hip   Pain Orientation Left   Pain Descriptors / Indicators Burning   Pain Type Acute pain   Pain Onset More than a month ago   Pain Frequency Constant   Aggravating Factors  weight bearing   Pain Relieving Factors rest   Multiple Pain Sites No          [facility] PT Assessment - 11/18/14 1551    Strength   Right Hip Flexion 5/5   Right Hip Extension 3+/5   Right Hip ABduction 5/5   Left Hip Flexion 3+/5   Left Hip Extension 3+/5   Left Hip ABduction 3+/5   Right Knee Flexion 5/5   Right Knee Extension 5/5   Left Knee Flexion --  4+/5   Left Knee Extension 5/5  [facility] Adult PT Treatment/Exercise - 11/18/14 1655    Knee/Hip Exercises: Stretches   Active Hamstring Stretch 2 reps;30 seconds   Piriformis Stretch 3 reps;30 seconds  passive and active left   Knee/Hip Exercises: Seated   Other Seated Knee Exercises March, decreased AROM at first x20   Knee/Hip Exercises: Supine   Straight Leg Raises 10 reps  bent knee due to pain/strain with SLR left   Other Supine Knee Exercises butterfly stretch 2 x 30 single and bilateral   Knee/Hip Exercises: Sidelying   Hip ABduction Left;2 sets;15 reps;Strengthening   Clams 15    Moist Heat Therapy   Number Minutes Moist Heat 15 Minutes   Moist Heat Location --  low back hip   Electrical Stimulation   Electrical Stimulation Location low back and hip left   Electrical Stimulation Action IFC   Electrical Stimulation Parameters 19   Electrical Stimulation Goals Pain                   PT Short Term Goals - 11/04/14 1142    PT SHORT TERM GOAL #1   Title Demo understanding of good posture   Status Achieved   PT SHORT TERM GOAL #2   Title She will report decr pain in back and thigh 25% or more   Baseline In back but hip still painful due to OA   Status Achieved   PT SHORT TERM GOAL #3   Title independent with initial HEP   Status Achieved   PT SHORT TERM GOAL #4   Title She will report tingling in lower leg and foot decr 25%    Status Achieved           PT Long Term Goals - 11/04/14 1143    PT LONG TERM GOAL #1   Title Independent with advanceed HEP   Status On-going   PT LONG TERM GOAL #2   Title She will report decr pain 50% or mroe in back and thigh    Baseline In back but not thigh/hip   Status On-going   PT LONG TERM GOAL #3   Title Report able to sit for 60 minutes without incr pain   Status On-going   PT LONG TERM GOAL #4   Title Report able to walk normally for 60 minute withut increased pain   Status On-going               Plan - 11/18/14 1652    Clinical Impression Statement Decreased left hip strength and left  Hip ER.Decreased pain to 2/10 and ER ROM after prone active piriformis release.    PT Next Visit Plan Continue exercises for core and hip strength adn manual techniques and modalities as needed.        Problem List Patient Active Problem List   Diagnosis Date Noted  . Dizziness and giddiness 02/07/2013  . Cough 09/23/2011  . Hypertension 08/11/2011  . Obesity 08/11/2011    Dorene Ar, PTA 11/18/2014, 5:10 PM  [place], [place], [address] Phone: [phone]   Fax:  [phone]

## 2014-11-25 ENCOUNTER — Ambulatory Visit: Payer: PRIVATE HEALTH INSURANCE

## 2014-11-25 DIAGNOSIS — R293 Abnormal posture: Secondary | ICD-10-CM

## 2014-11-25 DIAGNOSIS — M5416 Radiculopathy, lumbar region: Secondary | ICD-10-CM | POA: Diagnosis not present

## 2014-11-25 DIAGNOSIS — M25552 Pain in left hip: Secondary | ICD-10-CM

## 2014-11-25 DIAGNOSIS — R29898 Other symptoms and signs involving the musculoskeletal system: Secondary | ICD-10-CM

## 2014-11-25 NOTE — Patient Instructions (Signed)
She was instructed in removal of tape if irritated or leave on until next visit. Advised she could shower with tape.

## 2014-11-25 NOTE — Therapy (Signed)
[place], [place], [address] Phone: [phone]   Fax:  [phone]  Physical Therapy Treatment  Patient Details  Name: Nicole [name] MRN: [id number] Date of Birth: 03/01/1956 Referring Provider:  Gaynelle Arabian, MD  Encounter Date: 11/25/2014      PT End of Session - 11/25/14 1242    Visit Number 10   Number of Visits 12   Date for PT Re-Evaluation 12/05/14   PT Start Time 3846   PT Stop Time 1230   PT Time Calculation (min) 45 min   Activity Tolerance Patient tolerated treatment well   Behavior During Therapy [facility] for tasks assessed/performed      Past Medical History  Diagnosis Date  . Heart burn   . Complication of anesthesia   . PONV (postoperative nausea and vomiting)   . Family history of anesthesia complication     N&V- Mother   . Heart murmur     since birth  . Hypertension     followed by Dr. Marisue Humble   . H/O hiatal hernia     small hiatal hernia, has had endoscopy & colondoscopy  . GERD (gastroesophageal reflux disease)     Chron's disease, not medically treating currently   . Arthritis     lumbar- HNP, /w myelopathy  . Bronchitis     used albuterol , Sept. 2013, all clear now   . Asthma     /w reflux , consult /w Dr. Wert-2013  . Dizziness and giddiness   . Dizziness and giddiness 02/07/2013    Past Surgical History  Procedure Laterality Date  . Cholecystectomy  03    laparoscopic  . Lumbar laminectomy/decompression microdiscectomy  09/10/2012    Procedure: LUMBAR LAMINECTOMY/DECOMPRESSION MICRODISCECTOMY 1 LEVEL;  Surgeon: Kristeen Miss, MD;  Location: [place] [facility];  Service: Neurosurgery;  Laterality: Left;  Left Lumbar Five-Sacral One Microdiscectomy  . Gallbladder surgery  2004  . Back surgery  2013    There were no vitals taken for this visit.  Visit Diagnosis:  Lumbar radiculopathy  Weakness of left lower extremity  Pain in joint, pelvic region and thigh,  left  Posture abnormality      Subjective Assessment - 11/25/14 1148    Symptoms LT knee pain a problem. Pain on lateal joint line . Her back doesnt hurt now.    Currently in Pain? Yes   Pain Score 6    Pain Location Knee  No hip pain at this moment   Pain Orientation Left   Pain Descriptors / Indicators Burning   Pain Onset More than a month ago   Pain Frequency Constant   Aggravating Factors  weightbearing   Pain Relieving Factors rest, cold   Multiple Pain Sites No                    [facility] Adult PT Treatment/Exercise - 11/25/14 1236    Lumbar Exercises: Standing   Other Standing Lumbar Exercises Hip hindge and minisquat for hip and quad strength. 1015 reps with verbal , demo and tactile cues   Manual Therapy   Manual Therapy Joint mobilization;Myofascial release;Manual Traction   Joint Mobilization Distraction with rotation supine   Myofascial Release STW to RT gluteals andITB with easing of tnederness    Manual Traction with joint mobs   Other Manual Therapy Kineseotape 2 y's on lateral joint line for meniscal collateral ligament and around patella for pain control  PT Education - 11/25/14 1241    Education provided Yes   Education Details tape removal/precautions   Person(s) Educated Patient   Methods Explanation;Verbal cues   Comprehension Verbalized understanding          PT Short Term Goals - 11/25/14 1244    PT SHORT TERM GOAL #1   Title Demo understanding of good posture   Status Achieved   PT SHORT TERM GOAL #2   Title She will report decr pain in back and thigh 25% or more   Status Achieved   PT SHORT TERM GOAL #3   Title independent with initial HEP   Status Achieved   PT SHORT TERM GOAL #4   Title She will report tingling in lower leg and foot decr 25%    Status Achieved           PT Long Term Goals - 11/25/14 1244    PT LONG TERM GOAL #1   Title Independent with advanceed HEP   Status On-going   PT  LONG TERM GOAL #2   Title She will report decr pain 50% or mroe in back and thigh    Status Achieved   PT LONG TERM GOAL #3   Title Report able to sit for 60 minutes without incr pain   Status Achieved   PT LONG TERM GOAL #4   Title Report able to walk normally for 60 minute withut increased pain   Status On-going   PT LONG TERM GOAL #5   Title Return to work full duty    Status On-going               Plan - 11/25/14 1242    Clinical Impression Statement Her knee pain is primarily pain factor.  Strength and pain in hip add to limited mobility. She may need to see Ortho MD and she said she may see him at work   Pt will benefit from skilled therapeutic intervention in order to improve on the following deficits Difficulty walking;Pain;Decreased strength;Decreased range of motion   Rehab Potential Fair   PT Duration 2 weeks   PT Treatment/Interventions ADLs/Self Care Home Management;Therapeutic activities;Patient/family education;Traction;Moist Heat;Ultrasound;Electrical Stimulation;Therapeutic exercise;Manual techniques;Dry needling;Passive range of motion   PT Next Visit Plan Continue exercises for core and hip strength adn manual techniques and modalities as needed.   PT Home Exercise Plan Stabilization and hip strength   Consulted and Agree with Plan of Care Patient        Problem List Patient Active Problem List   Diagnosis Date Noted  . Dizziness and giddiness 02/07/2013  . Cough 09/23/2011  . Hypertension 08/11/2011  . Obesity 08/11/2011    Darrel Hoover PT 11/25/2014, 12:46 PM  [facility] [address] [place], [place], [address] Phone: [phone]   Fax:  [phone]

## 2014-11-27 ENCOUNTER — Ambulatory Visit: Payer: PRIVATE HEALTH INSURANCE

## 2014-11-27 DIAGNOSIS — M5416 Radiculopathy, lumbar region: Secondary | ICD-10-CM

## 2014-11-27 DIAGNOSIS — M25552 Pain in left hip: Secondary | ICD-10-CM

## 2014-11-27 DIAGNOSIS — R293 Abnormal posture: Secondary | ICD-10-CM

## 2014-11-27 DIAGNOSIS — R29898 Other symptoms and signs involving the musculoskeletal system: Secondary | ICD-10-CM

## 2014-11-27 NOTE — Therapy (Signed)
[place], [place], [address] Phone: [phone]   Fax:  [phone]  Physical Therapy Treatment  Patient Details  Name: Nicole [name] MRN: [id number] Date of Birth: 10-Jul-1956 Referring Provider:  Gaynelle Arabian, MD  Encounter Date: 11/27/2014      PT End of Session - 11/27/14 1453    Visit Number 11   Number of Visits 12   Date for PT Re-Evaluation 12/05/14   PT Start Time 6568   PT Stop Time 1455   PT Time Calculation (min) 40 min   Activity Tolerance Patient tolerated treatment well   Behavior During Therapy [facility] for tasks assessed/performed      Past Medical History  Diagnosis Date  . Heart burn   . Complication of anesthesia   . PONV (postoperative nausea and vomiting)   . Family history of anesthesia complication     N&V- Mother   . Heart murmur     since birth  . Hypertension     followed by Dr. Marisue Humble   . H/O hiatal hernia     small hiatal hernia, has had endoscopy & colondoscopy  . GERD (gastroesophageal reflux disease)     Chron's disease, not medically treating currently   . Arthritis     lumbar- HNP, /w myelopathy  . Bronchitis     used albuterol , Sept. 2013, all clear now   . Asthma     /w reflux , consult /w Dr. Wert-2013  . Dizziness and giddiness   . Dizziness and giddiness 02/07/2013    Past Surgical History  Procedure Laterality Date  . Cholecystectomy  03    laparoscopic  . Lumbar laminectomy/decompression microdiscectomy  09/10/2012    Procedure: LUMBAR LAMINECTOMY/DECOMPRESSION MICRODISCECTOMY 1 LEVEL;  Surgeon: Kristeen Miss, MD;  Location: [place] [facility];  Service: Neurosurgery;  Laterality: Left;  Left Lumbar Five-Sacral One Microdiscectomy  . Gallbladder surgery  2004  . Back surgery  2013    There were no vitals taken for this visit.  Visit Diagnosis:  Lumbar radiculopathy  Weakness of left lower extremity  Pain in joint, pelvic region and thigh,  left  Posture abnormality      Subjective Assessment - 11/27/14 1426    Symptoms LT knee pain a problem. Pain on lateal joint line . Her back doesnt hurt now. . Some goin pain today, Knee hurt at work last time but felt better after iceing and tale removal at home that evening   Currently in Pain? Yes   Pain Score 0-No pain  At rest on mast table   Pain Location Knee   Pain Orientation Left   Pain Descriptors / Indicators Burning   Pain Type --  Sub acute   Pain Onset More than a month ago   Pain Frequency Intermittent   Aggravating Factors  weight bearing   Multiple Pain Sites No                    [facility] Adult PT Treatment/Exercise - 11/27/14 1435    Lumbar Exercises: Supine   Clam 15 reps;2 seconds  RT and LT   Bent Knee Raise 15 reps;3 seconds   RT and Lt    Bridge 15 reps   Bridge Limitations shoulder bridge for technique   Straight Leg Raise --  Not able due to groin pain   Knee/Hip Exercises: Sidelying   Hip ABduction AROM;1 set;15 reps;10 reps   Clams x15 LT hip  PT Short Term Goals - 11/25/14 1244    PT SHORT TERM GOAL #1   Title Demo understanding of good posture   Status Achieved   PT SHORT TERM GOAL #2   Title She will report decr pain in back and thigh 25% or more   Status Achieved   PT SHORT TERM GOAL #3   Title independent with initial HEP   Status Achieved   PT SHORT TERM GOAL #4   Title She will report tingling in lower leg and foot decr 25%    Status Achieved           PT Long Term Goals - 11/25/14 1244    PT LONG TERM GOAL #1   Title Independent with advanceed HEP   Status On-going   PT LONG TERM GOAL #2   Title She will report decr pain 50% or mroe in back and thigh    Status Achieved   PT LONG TERM GOAL #3   Title Report able to sit for 60 minutes without incr pain   Status Achieved   PT LONG TERM GOAL #4   Title Report able to walk normally for 60 minute withut increased pain   Status  On-going   PT LONG TERM GOAL #5   Title Return to work full duty    Status On-going               Plan - 11/27/14 1455    Clinical Impression Statement Knee pain better today so did not tape. One more visit so will review HEP and discharge   Pt will benefit from skilled therapeutic intervention in order to improve on the following deficits Difficulty walking;Pain;Decreased strength;Decreased range of motion   Rehab Potential Fair   PT Frequency 1x / week   PT Next Visit Plan Continue exercises for core and hip strength adn manual techniques and modalities as needed. discharge   PT Home Exercise Plan Stabilization and hip strength   Consulted and Agree with Plan of Care Patient        Problem List Patient Active Problem List   Diagnosis Date Noted  . Dizziness and giddiness 02/07/2013  . Cough 09/23/2011  . Hypertension 08/11/2011  . Obesity 08/11/2011    Darrel Hoover PT 11/27/2014, 2:56 PM  [facility] [address] [place], [place], [address] Phone: [phone]   Fax:  [phone]

## 2014-12-02 ENCOUNTER — Ambulatory Visit: Payer: PRIVATE HEALTH INSURANCE | Admitting: Rehabilitation

## 2014-12-02 DIAGNOSIS — R293 Abnormal posture: Secondary | ICD-10-CM

## 2014-12-02 DIAGNOSIS — M5416 Radiculopathy, lumbar region: Secondary | ICD-10-CM | POA: Diagnosis not present

## 2014-12-02 DIAGNOSIS — M25552 Pain in left hip: Secondary | ICD-10-CM

## 2014-12-02 DIAGNOSIS — R29898 Other symptoms and signs involving the musculoskeletal system: Secondary | ICD-10-CM

## 2014-12-02 NOTE — Therapy (Signed)
[place], [place], [address] Phone: [phone]   Fax:  [phone]  Physical Therapy Treatment  Patient Details  Name: Nicole [name] MRN: [id number] Date of Birth: 08-07-1956 Referring Provider:  Gaynelle Arabian, MD  Encounter Date: 12/02/2014      PT End of Session - 12/02/14 1251    Visit Number 11   Number of Visits 12   Date for PT Re-Evaluation 12/05/14   PT Start Time 1102   PT Stop Time 1200   PT Time Calculation (min) 58 min      Past Medical History  Diagnosis Date  . Heart burn   . Complication of anesthesia   . PONV (postoperative nausea and vomiting)   . Family history of anesthesia complication     N&V- Mother   . Heart murmur     since birth  . Hypertension     followed by Dr. Marisue Humble   . H/O hiatal hernia     small hiatal hernia, has had endoscopy & colondoscopy  . GERD (gastroesophageal reflux disease)     Chron's disease, not medically treating currently   . Arthritis     lumbar- HNP, /w myelopathy  . Bronchitis     used albuterol , Sept. 2013, all clear now   . Asthma     /w reflux , consult /w Dr. Wert-2013  . Dizziness and giddiness   . Dizziness and giddiness 02/07/2013    Past Surgical History  Procedure Laterality Date  . Cholecystectomy  03    laparoscopic  . Lumbar laminectomy/decompression microdiscectomy  09/10/2012    Procedure: LUMBAR LAMINECTOMY/DECOMPRESSION MICRODISCECTOMY 1 LEVEL;  Surgeon: Kristeen Miss, MD;  Location: [facility];  Service: Neurosurgery;  Laterality: Left;  Left Lumbar Five-Sacral One Microdiscectomy  . Gallbladder surgery  2004  . Back surgery  2013    There were no vitals taken for this visit.  Visit Diagnosis:  Lumbar radiculopathy  Weakness of left lower extremity  Pain in joint, pelvic region and thigh, left  Posture abnormality                  [facility] Adult PT Treatment/Exercise - 12/02/14 1126    Knee/Hip Exercises: Stretches   Hip Flexor Stretch 3 reps;30 seconds  standing lunge with knee on table   ITB Stretch 3 reps;30 seconds  also adductor stretch with strap   Piriformis Stretch 3 reps;30 seconds   Knee/Hip Exercises: Standing   Other Standing Knee Exercises 3 way hip x 10 each left   Knee/Hip Exercises: Supine   Heel Slides 10 reps   Straight Leg Raises --  unable to lift from table    Knee/Hip Exercises: Sidelying   Hip ABduction 10 reps   Modalities   Modalities Electrical Stimulation;Moist Heat   Moist Heat Therapy   Number Minutes Moist Heat 15 Minutes   Moist Heat Location --  lumbar/glut   Electrical Stimulation   Electrical Stimulation Location left hip/glut   Electrical Stimulation Action IFC   Electrical Stimulation Parameters to tolerance   Electrical Stimulation Goals Pain                  PT Short Term Goals - 11/25/14 1244    PT SHORT TERM GOAL #1   Title Demo understanding of good posture   Status Achieved   PT SHORT TERM GOAL #2   Title She will report decr pain in back and thigh 25% or more  Status Achieved   PT SHORT TERM GOAL #3   Title independent with initial HEP   Status Achieved   PT SHORT TERM GOAL #4   Title She will report tingling in lower leg and foot decr 25%    Status Achieved           PT Long Term Goals - 11/25/14 1244    PT LONG TERM GOAL #1   Title Independent with advanceed HEP   Status On-going   PT LONG TERM GOAL #2   Title She will report decr pain 50% or mroe in back and thigh    Status Achieved   PT LONG TERM GOAL #3   Title Report able to sit for 60 minutes without incr pain   Status Achieved   PT LONG TERM GOAL #4   Title Report able to walk normally for 60 minute withut increased pain   Status On-going   PT LONG TERM GOAL #5   Title Return to work full duty    Status On-going               Plan - 12/02/14 1252    Clinical Impression Statement Instructed patient in self ITB  massage with foam roller and ITB stretch. Very tender proxiaml and distal ITB. Pt reports groin pain has returned making difficult to lift leg in /out of car without help.    PT Next Visit Plan Discharge next visit        Problem List Patient Active Problem List   Diagnosis Date Noted  . Dizziness and giddiness 02/07/2013  . Cough 09/23/2011  . Hypertension 08/11/2011  . Obesity 08/11/2011    Dorene Ar, PTA 12/02/2014, 12:54 PM  [place], [place], [address] Phone: [phone]   Fax:  [phone]

## 2014-12-04 ENCOUNTER — Ambulatory Visit: Payer: PRIVATE HEALTH INSURANCE | Admitting: Rehabilitation

## 2014-12-04 DIAGNOSIS — M5416 Radiculopathy, lumbar region: Secondary | ICD-10-CM

## 2014-12-04 DIAGNOSIS — M25552 Pain in left hip: Secondary | ICD-10-CM

## 2014-12-04 DIAGNOSIS — R293 Abnormal posture: Secondary | ICD-10-CM

## 2014-12-04 DIAGNOSIS — R29898 Other symptoms and signs involving the musculoskeletal system: Secondary | ICD-10-CM

## 2014-12-04 NOTE — Therapy (Signed)
[place], [place], [address] Phone: [phone]   Fax:  [phone]  Physical Therapy Treatment  Patient Details  Name: Nicole [name] MRN: [id number] Date of Birth: [birth year] Referring Provider:  Gaynelle Arabian, MD  Encounter Date: 12/04/2014      PT End of Session - 12/04/14 1236    Visit Number 12   Number of Visits 12   Date for PT Re-Evaluation 12/05/14   PT Start Time 3300   PT Stop Time 1235   PT Time Calculation (min) 50 min      Past Medical History  Diagnosis Date  . Heart burn   . Complication of anesthesia   . PONV (postoperative nausea and vomiting)   . Family history of anesthesia complication     N&V- Mother   . Heart murmur     since birth  . Hypertension     followed by Dr. Marisue Humble   . H/O hiatal hernia     small hiatal hernia, has had endoscopy & colondoscopy  . GERD (gastroesophageal reflux disease)     Chron's disease, not medically treating currently   . Arthritis     lumbar- HNP, /w myelopathy  . Bronchitis     used albuterol , Sept. 2013, all clear now   . Asthma     /w reflux , consult /w Dr. Wert-2013  . Dizziness and giddiness   . Dizziness and giddiness 02/07/2013    Past Surgical History  Procedure Laterality Date  . Cholecystectomy  03    laparoscopic  . Lumbar laminectomy/decompression microdiscectomy  09/10/2012    Procedure: LUMBAR LAMINECTOMY/DECOMPRESSION MICRODISCECTOMY 1 LEVEL;  Surgeon: Kristeen Miss, MD;  Location: [facility];  Service: Neurosurgery;  Laterality: Left;  Left Lumbar Five-Sacral One Microdiscectomy  . Gallbladder surgery  2004  . Back surgery  2013    There were no vitals taken for this visit.  Visit Diagnosis:  Lumbar radiculopathy  Weakness of left lower extremity  Pain in joint, pelvic region and thigh, left  Posture abnormality                  [facility] Adult PT Treatment/Exercise - 12/04/14 1239    Lumbar Exercises: Aerobic   Stationary Bike 8 min level 2 Rec Bike   Knee/Hip Exercises: Standing   Functional Squat 15 reps  hip Hinge   SLS with Vectors in parallel bars 10 x 2 each   Modalities   Modalities Electrical Stimulation;Moist Heat   Moist Heat Therapy   Number Minutes Moist Heat 15 Minutes   Moist Heat Location --  lumbar/glut   Electrical Stimulation   Electrical Stimulation Location left hip/glut   Electrical Stimulation Action IFC   Electrical Stimulation Parameters to tolerance   Electrical Stimulation Goals Pain                PT Education - 12/04/14 1245    Education provided Yes   Education Details Self Care : pt able to return demonstrate safe lifting mechanics in clinic   Person(s) Educated Patient   Methods Explanation   Comprehension Verbalized understanding;Returned demonstration          PT Short Term Goals - 11/25/14 1244    PT SHORT TERM GOAL #1   Title Demo understanding of good posture   Status Achieved   PT SHORT TERM GOAL #2   Title She will report decr pain in back and thigh 25% or more  Status Achieved   PT SHORT TERM GOAL #3   Title independent with initial HEP   Status Achieved   PT SHORT TERM GOAL #4   Title She will report tingling in lower leg and foot decr 25%    Status Achieved           PT Long Term Goals - 12/04/14 1214    PT LONG TERM GOAL #1   Title Independent with advanceed HEP   Time 6   Period Weeks   Status Achieved   PT LONG TERM GOAL #2   Title She will report decr pain 50% or mroe in back and thigh    Time 6   Period Weeks   Status Achieved   PT LONG TERM GOAL #3   Title Report able to sit for 60 minutes without incr pain   Time 6   Period Weeks   Status Not Met  can tolerate only 15 min at a time   PT LONG TERM GOAL #4   Title Report able to walk normally for 60 minute withut increased pain   Time 6   Period Weeks   Status Not Met  can tolerate less than 5 minutes with SPC   PT LONG  TERM GOAL #5   Title Return to work full duty    Time 6   Period Weeks   Status Not Met  Light duty only               Plan - 12/04/14 1217    Clinical Impression Statement Pt is limited with ADLs, job duties, and demonstrates moderate gait deviations due to left hip weakness. pt plans to have hip replacement    PT Next Visit Plan discharge today        Problem List Patient Active Problem List   Diagnosis Date Noted  . Dizziness and giddiness 02/07/2013  . Cough 09/23/2011  . Hypertension 08/11/2011  . Obesity 08/11/2011    Dorene Ar, PTA 12/04/2014, 12:48 PM  [place], [place], [address] Phone: [phone]   Fax:  [phone]

## 2014-12-10 NOTE — Therapy (Signed)
[place], [place], [address] Phone: [phone]   Fax:  [phone]  Physical Therapy Treatment  Patient Details  Name: Nicole [name] MRN: [id number] Date of Birth: Apr 03, 1956 Referring Provider:  Gaynelle Arabian, MD  Encounter Date: 12/04/2014    Past Medical History  Diagnosis Date  . Heart burn   . Complication of anesthesia   . PONV (postoperative nausea and vomiting)   . Family history of anesthesia complication     N&V- Mother   . Heart murmur     since birth  . Hypertension     followed by Dr. Marisue Humble   . H/O hiatal hernia     small hiatal hernia, has had endoscopy & colondoscopy  . GERD (gastroesophageal reflux disease)     Chron's disease, not medically treating currently   . Arthritis     lumbar- HNP, /w myelopathy  . Bronchitis     used albuterol , Sept. 2013, all clear now   . Asthma     /w reflux , consult /w Dr. Wert-2013  . Dizziness and giddiness   . Dizziness and giddiness 02/07/2013    Past Surgical History  Procedure Laterality Date  . Cholecystectomy  03    laparoscopic  . Lumbar laminectomy/decompression microdiscectomy  09/10/2012    Procedure: LUMBAR LAMINECTOMY/DECOMPRESSION MICRODISCECTOMY 1 LEVEL;  Surgeon: Kristeen Miss, MD;  Location: [facility];  Service: Neurosurgery;  Laterality: Left;  Left Lumbar Five-Sacral One Microdiscectomy  . Gallbladder surgery  2004  . Back surgery  2013    There were no vitals taken for this visit.  Visit Diagnosis:  Lumbar radiculopathy  Weakness of left lower extremity  Pain in joint, pelvic region and thigh, left  Posture abnormality                            PT Short Term Goals - 11/25/14 1244    PT SHORT TERM GOAL #1   Title Demo understanding of good posture   Status Achieved   PT SHORT TERM GOAL #2   Title She will report decr pain in back and thigh 25% or more   Status Achieved   PT  SHORT TERM GOAL #3   Title independent with initial HEP   Status Achieved   PT SHORT TERM GOAL #4   Title She will report tingling in lower leg and foot decr 25%    Status Achieved           PT Long Term Goals - 12/04/14 1214    PT LONG TERM GOAL #1   Title Independent with advanceed HEP   Time 6   Period Weeks   Status Achieved   PT LONG TERM GOAL #2   Title She will report decr pain 50% or mroe in back and thigh    Time 6   Period Weeks   Status Achieved   PT LONG TERM GOAL #3   Title Report able to sit for 60 minutes without incr pain   Time 6   Period Weeks   Status Not Met  can tolerate only 15 min at a time   PT LONG TERM GOAL #4   Title Report able to walk normally for 60 minute withut increased pain   Time 6   Period Weeks   Status Not Met  can tolerate less than 5 minutes with SPC   PT LONG TERM GOAL #5  Title Return to work full duty    Time [place]   Status Not Met  Light duty only      [place]  Visits from Start of Care: 12  Current functional level related to goals / functional outcomes: She continues with pain and mobility limits due to LT hip OA and LT knee pain   Remaining deficits: Weakness and pain related to hip and knee pain/OA   Education / Equipment: HEP for strength and flexibility Plan: Patient agrees to discharge.  Patient goals were partially met. Patient is being discharged due to lack of progress.  ?????              Problem List Patient Active Problem List   Diagnosis Date Noted  . Dizziness and giddiness 02/07/2013  . Cough 09/23/2011  . Hypertension 08/11/2011  . Obesity 08/11/2011    Darrel Hoover PT 12/10/2014, 12:27 PM  [facility] [address] [place], [place], [address] Phone: [phone]   Fax:  [phone]

## 2014-12-17 NOTE — Progress Notes (Signed)
Please put orders in Epic surgery 12-24-14 pre op 12-18-14  Thank

## 2014-12-18 ENCOUNTER — Encounter (HOSPITAL_COMMUNITY)
Admission: RE | Admit: 2014-12-18 | Discharge: 2014-12-18 | Disposition: A | Discharge status: Home / Self Care | Payer: 59 | Source: Ambulatory Visit | Attending: Orthopedic Surgery | Admitting: Orthopedic Surgery

## 2014-12-18 ENCOUNTER — Other Ambulatory Visit: Payer: Self-pay

## 2014-12-18 ENCOUNTER — Encounter (HOSPITAL_COMMUNITY): Payer: Self-pay

## 2014-12-18 DIAGNOSIS — M1612 Unilateral primary osteoarthritis, left hip: Secondary | ICD-10-CM | POA: Insufficient documentation

## 2014-12-18 DIAGNOSIS — Z01818 Encounter for other preprocedural examination: Secondary | ICD-10-CM | POA: Insufficient documentation

## 2014-12-18 HISTORY — DX: Personal history of other specified conditions: Z87.898

## 2014-12-18 HISTORY — DX: Stress incontinence (female) (male): N39.3

## 2014-12-18 HISTORY — DX: Sleep disorder, unspecified: G47.9

## 2014-12-18 HISTORY — DX: Anesthesia of skin: R20.0

## 2014-12-18 HISTORY — DX: Anxiety disorder, unspecified: F41.9

## 2014-12-18 LAB — BASIC METABOLIC PANEL
Anion gap: 7 (ref 5–15)
BUN: 13 mg/dL (ref 6–23)
CO2: 29 mmol/L (ref 19–32)
Calcium: 9.4 mg/dL (ref 8.4–10.5)
Chloride: 104 mmol/L (ref 96–112)
Creatinine, Ser: 0.75 mg/dL (ref 0.50–1.10)
GFR calc non Af Amer: >90 mL/min (ref >90)
Glucose, Bld: 103 mg/dL — ABNORMAL HIGH (ref 70–99)
POTASSIUM: 4 mmol/L (ref 3.5–5.1)
SODIUM: 140 mmol/L (ref 135–145)

## 2014-12-18 LAB — URINALYSIS, ROUTINE W REFLEX MICROSCOPIC
BILIRUBIN URINE: NEGATIVE
Glucose, UA: NEGATIVE mg/dL
KETONES UR: NEGATIVE mg/dL
LEUKOCYTES UA: NEGATIVE
NITRITE: NEGATIVE
Protein, ur: NEGATIVE mg/dL
SPECIFIC GRAVITY, URINE: 1.022 (ref 1.005–1.030)
Urobilinogen, UA: 0.2 mg/dL (ref 0.0–1.0)
pH: 6 (ref 5.0–8.0)

## 2014-12-18 LAB — CBC
HCT: 40.7 % (ref 36.0–46.0)
HEMOGLOBIN: 13.2 g/dL (ref 12.0–15.0)
MCH: 30.4 pg (ref 26.0–34.0)
MCHC: 32.4 g/dL (ref 30.0–36.0)
MCV: 93.8 fL (ref 78.0–100.0)
Platelets: 367 10*3/uL (ref 150–400)
RBC: 4.34 MIL/uL (ref 3.87–5.11)
RDW: 13.1 % (ref 11.5–15.5)
WBC: 9.7 10*3/uL (ref 4.0–10.5)

## 2014-12-18 LAB — SURGICAL PCR SCREEN
MRSA, PCR: NEGATIVE
Staphylococcus aureus: NEGATIVE

## 2014-12-18 LAB — URINE MICROSCOPIC-ADD ON

## 2014-12-18 LAB — PROTIME-INR
INR: 0.99 (ref 0.00–1.49)
PROTHROMBIN TIME: 13.2 s (ref 11.6–15.2)

## 2014-12-18 LAB — APTT: aPTT: 31 seconds (ref 24–37)

## 2014-12-18 NOTE — Patient Instructions (Addendum)
Nicole [name]  12/18/2014   Your procedure is scheduled on: 12/24/14   Report to [facility]  Entrance and follow signs to               [place] at 11:30 AM.   Call this number if you have problems the morning of surgery [phone]   Remember:  Do not eat food  :After Midnight.              MAY HAVE CLEAR LIQUIDS UNTIL 7:30 AM    CLEAR LIQUID DIET   Foods Allowed                                                                     Foods Excluded  Coffee and tea, regular and decaf                             liquids that you cannot  Plain Jell-O in any flavor                                             see through such as: Fruit ices (not with fruit pulp)                                     milk, soups, orange juice  Iced Popsicles                                    All solid food Carbonated beverages, regular and diet                                    Cranberry, grape and apple juices Sports drinks like Gatorade Lightly seasoned clear broth or consume(fat free) Sugar, honey syrup  _____________________________________________________________________   Take these medicines the morning of surgery with A SIP OF WATER: NEXIUM / NORVASC                                You may not have any metal on your body including hair pins and              piercings  Do not wear jewelry, make-up, lotions, powders or perfumes.             Do not wear nail polish.  Do not shave  48 hours prior to surgery.              Men may shave face and neck.   Do not bring valuables to the hospital. [place].  Contacts, dentures or bridgework may not be worn into surgery.  Leave suitcase  in the car. After surgery it may be brought to your room.     Patients discharged the day of surgery will not be allowed to drive home.  Name and phone number of your driver:  Special Instructions: N/A               Please read over the following fact sheets you were given: _____________________________________________________________________                                    [place]  Before surgery, you can play an important role.  Because skin is not sterile, your skin needs to be as free of germs as possible.  You can reduce the number of germs on your skin by washing with CHG (chlorahexidine gluconate) soap before surgery.  CHG is an antiseptic cleaner which kills germs and bonds with the skin to continue killing germs even after washing. Please DO NOT use if you have an allergy to CHG or antibacterial soaps.  If your skin becomes reddened/irritated stop using the CHG and inform your nurse when you arrive at Short Stay. Do not shave (including legs and underarms) for at least 48 hours prior to the first CHG shower.  You may shave your face. Please follow these instructions carefully:   1.  Shower with CHG Soap the night before surgery and the  morning of Surgery.   2.  If you choose to wash your hair, wash your hair first as usual with your  normal  Shampoo.   3.  After you shampoo, rinse your hair and body thoroughly to remove the  shampoo.                                         4.  Use CHG as you would any other liquid soap.  You can apply chg directly  to the skin and wash . Gently wash with scrungie or clean wascloth    5.  Apply the CHG Soap to your body ONLY FROM THE NECK DOWN.   Do not use on open                           Wound or open sores. Avoid contact with eyes, ears mouth and genitals (private parts).                        Genitals (private parts) with your normal soap.              6.  Wash thoroughly, paying special attention to the area where your surgery  will be performed.   7.  Thoroughly rinse your body with warm water from the neck down.   8.  DO NOT shower/wash with your normal soap after using and rinsing off  the CHG Soap .                 9.  Pat yourself dry with a clean towel.             10.  Wear clean pajamas.             11.  Place clean sheets on your bed the night of your first shower and do not  sleep  with pets.  Day of Surgery : Do not apply any lotions/deodorants the morning of surgery.  Please wear clean clothes to the hospital/surgery center.  FAILURE TO FOLLOW THESE INSTRUCTIONS MAY RESULT IN THE CANCELLATION OF YOUR SURGERY    PATIENT SIGNATURE_________________________________  ______________________________________________________________________     Nicole [name]  An incentive spirometer is a tool that can help keep your lungs clear and active. This tool measures how well you are filling your lungs with each breath. Taking long deep breaths may help reverse or decrease the chance of developing breathing (pulmonary) problems (especially infection) following:  A long period of time when you are unable to move or be active. BEFORE THE PROCEDURE   If the spirometer includes an indicator to show your best effort, your nurse or respiratory therapist will set it to a desired goal.  If possible, sit up straight or lean slightly forward. Try not to slouch.  Hold the incentive spirometer in an upright position. INSTRUCTIONS FOR USE   Sit on the edge of your bed if possible, or sit up as far as you can in bed or on a chair.  Hold the incentive spirometer in an upright position.  Breathe out normally.  Place the mouthpiece in your mouth and seal your lips tightly around it.  Breathe in slowly and as deeply as possible, raising the piston or the ball toward the top of the column.  Hold your breath for 3-5 seconds or for as long as possible. Allow the piston or ball to fall to the bottom of the column.  Remove the mouthpiece from your mouth and breathe out normally.  Rest for a few seconds and repeat Steps 1 through 7 at least 10 times every 1-2 hours when you are awake. Take your time  and take a few normal breaths between deep breaths.  The spirometer may include an indicator to show your best effort. Use the indicator as a goal to work toward during each repetition.  After each set of 10 deep breaths, practice coughing to be sure your lungs are clear. If you have an incision (the cut made at the time of surgery), support your incision when coughing by placing a pillow or rolled up towels firmly against it. Once you are able to get out of bed, walk around indoors and cough well. You may stop using the incentive spirometer when instructed by your caregiver.  RISKS AND COMPLICATIONS  Take your time so you do not get dizzy or light-headed.  If you are in pain, you may need to take or ask for pain medication before doing incentive spirometry. It is harder to take a deep breath if you are having pain. AFTER USE  Rest and breathe slowly and easily.  It can be helpful to keep track of a log of your progress. Your caregiver can provide you with a simple table to help with this. If you are using the spirometer at home, follow these instructions: [place] IF:   You are having difficultly using the spirometer.  You have trouble using the spirometer as often as instructed.  Your pain medication is not giving enough relief while using the spirometer.  You develop fever of 100.5 F (38.1 C) or higher. SEEK IMMEDIATE MEDICAL CARE IF:   You cough up bloody sputum that had not been present before.  You develop fever of 102 F (38.9 C) or greater.  You develop worsening pain at or near the incision site. MAKE SURE YOU:  Understand these instructions.  Will watch your condition.  Will get help right away if you are not doing well or get worse. Document Released: 03/13/2007 Document Revised: 01/23/2012 Document Reviewed: 05/14/2007 ExitCare Patient Information 2014 ExitCare,  [place].   ________________________________________________________________________  WHAT IS A BLOOD TRANSFUSION? Blood Transfusion Information  A transfusion is the replacement of blood or some of its parts. Blood is made up of multiple cells which provide different functions.  Red blood cells carry oxygen and are used for blood loss replacement.  White blood cells fight against infection.  Platelets control bleeding.  Plasma helps clot blood.  Other blood products are available for specialized needs, such as hemophilia or other clotting disorders. BEFORE THE TRANSFUSION  Who gives blood for transfusions?   Healthy volunteers who are fully evaluated to make sure their blood is safe. This is blood bank blood. Transfusion therapy is the safest it has ever been in the practice of medicine. Before blood is taken from a donor, a complete history is taken to make sure that person has no history of diseases nor engages in risky social behavior (examples are intravenous drug use or sexual activity with multiple partners). The donor's travel history is screened to minimize risk of transmitting infections, such as malaria. The donated blood is tested for signs of infectious diseases, such as HIV and hepatitis. The blood is then tested to be sure it is compatible with you in order to minimize the chance of a transfusion reaction. If you or a relative donates blood, this is often done in anticipation of surgery and is not appropriate for emergency situations. It takes many days to process the donated blood. RISKS AND COMPLICATIONS Although transfusion therapy is very safe and saves many lives, the main dangers of transfusion include:   Getting an infectious disease.  Developing a transfusion reaction. This is an allergic reaction to something in the blood you were given. Every precaution is taken to prevent this. The decision to have a blood transfusion has been considered carefully by your caregiver  before blood is given. Blood is not given unless the benefits outweigh the risks. AFTER THE TRANSFUSION  Right after receiving a blood transfusion, you will usually feel much better and more energetic. This is especially true if your red blood cells have gotten low (anemic). The transfusion raises the level of the red blood cells which carry oxygen, and this usually causes an energy increase.  The nurse administering the transfusion will monitor you carefully for complications. HOME CARE INSTRUCTIONS  No special instructions are needed after a transfusion. You may find your energy is better. Speak with your caregiver about any limitations on activity for underlying diseases you may have. SEEK MEDICAL CARE IF:   Your condition is not improving after your transfusion.  You develop redness or irritation at the intravenous (IV) site. SEEK IMMEDIATE MEDICAL CARE IF:  Any of the following symptoms occur over the next 12 hours:  Shaking chills.  You have a temperature by mouth above 102 F (38.9 C), not controlled by medicine.  Chest, back, or muscle pain.  People around you feel you are not acting correctly or are confused.  Shortness of breath or difficulty breathing.  Dizziness and fainting.  You get a rash or develop hives.  You have a decrease in urine output.  Your urine turns a dark color or changes to pink, red, or brown. Any of the following symptoms occur over the next 10 days:  You have a temperature  by mouth above 102 F (38.9 C), not controlled by medicine.  Shortness of breath.  Weakness after normal activity.  The white part of the eye turns yellow (jaundice).  You have a decrease in the amount of urine or are urinating less often.  Your urine turns a dark color or changes to pink, red, or brown. Document Released: 10/28/2000 Document Revised: 01/23/2012 Document Reviewed: 06/16/2008 [facility] Patient Information 2014 [place],  [place].  _______________________________________________________________________

## 2014-12-18 NOTE — Progress Notes (Signed)
U/A with micro results faxed via EPIc to Dr Alvan Dame.

## 2014-12-22 NOTE — Progress Notes (Signed)
Final EKG in EPIC done 12/18/2014.

## 2014-12-23 NOTE — H&P (Signed)
TOTAL HIP ADMISSION H&P  Patient is admitted for left total hip arthroplasty, anterior approach .  Subjective:  Chief Complaint:   Left hip primary OA / pain  HPI: Nicole [name], 59 y.o. female, has a history of pain and functional disability in the left hip(s) due to arthritis and patient has failed non-surgical conservative treatments for greater than 12 weeks to include NSAID's and/or analgesics, use of assistive devices and activity modification.  Onset of symptoms was gradual starting  <1 year ago with rapidlly worsening course since that time.The patient noted no past surgery on the left hip(s).  Patient currently rates pain in the left hip at 10 out of 10 with activity. Patient has night pain, worsening of pain with activity and weight bearing, trendelenberg gait, pain that interfers with activities of daily living and pain with passive range of motion. Patient has evidence of periarticular osteophytes and joint space narrowing by imaging studies. This condition presents safety issues increasing the risk of falls.  There is no current active infection.  Risks, benefits and expectations were discussed with the patient.  Risks including but not limited to the risk of anesthesia, blood clots, nerve damage, blood vessel damage, failure of the prosthesis, infection and up to and including death.  Patient understand the risks, benefits and expectations and wishes to proceed with surgery.  PCP: Simona Huh, MD  D/C Plans:      Home with HHPT  Post-op Meds:       No Rx given  Tranexamic Acid:      To be given - IV  topically  Decadron:      Is to be given  FYI:     ASA post-op  Norco post-op    Patient Active Problem List   Diagnosis Date Noted  . Dizziness and giddiness 02/07/2013  . Cough 09/23/2011  . Hypertension 08/11/2011  . Obesity 08/11/2011   Past Medical History  Diagnosis Date  . Complication of anesthesia   . PONV (postoperative nausea and vomiting)   . Family  history of anesthesia complication     N&V- Mother   . Heart murmur     since birth  . Hypertension     followed by Dr. Marisue Humble   . H/O hiatal hernia     small hiatal hernia, has had endoscopy & colondoscopy  . GERD (gastroesophageal reflux disease)     Chron's disease, not medically treating currently   . Arthritis     lumbar- HNP, /w myelopathy  . Bronchitis     used albuterol , Sept. 2013, all clear now   . Asthma     /w reflux , consult /w Dr. Wert-2013 / USES INHALER PRN INFREQUENTLY  . Numbness in left leg   . Stress incontinence   . Difficulty sleeping     DUE TO PAIN  . Anxiety   . H/O vertigo     Past Surgical History  Procedure Laterality Date  . Cholecystectomy  03    laparoscopic  . Lumbar laminectomy/decompression microdiscectomy  09/10/2012    Procedure: LUMBAR LAMINECTOMY/DECOMPRESSION MICRODISCECTOMY 1 LEVEL;  Surgeon: Kristeen Miss, MD;  Location: [facility];  Service: Neurosurgery;  Laterality: Left;  Left Lumbar Five-Sacral One Microdiscectomy  . Back surgery  2013    No prescriptions prior to admission   Allergies  Allergen Reactions  . Metoclopramide Hcl Other (See Comments)    Cramping, anxiety and depression; "makes me crazy"  . Hctz [Hydrochlorothiazide] Other (See Comments)  MUSCLE CRAMPING  . Voltaren [Diclofenac Sodium]     Swelling in leg and became parched.   . Avelox [Moxifloxacin Hcl In Nacl] Nausea And Vomiting  . Neosporin [Neomycin-Bacitracin Zn-Polymyx] Other (See Comments)    Blisters, rash    History  Substance Use Topics  . Smoking status: Former Smoker -- 1.50 packs/day for 27 years    Types: Cigarettes    Quit date: 11/15/1999  . Smokeless tobacco: Never Used  . Alcohol Use: Yes     Comment: Seldom drinks, but occasional wine.    No family history on file.   Review of Systems  Constitutional: Negative.   HENT: Negative.   Eyes: Negative.   Respiratory: Negative.   Cardiovascular: Negative.   Gastrointestinal:  Positive for heartburn.  Genitourinary: Negative.   Musculoskeletal: Positive for joint pain.  Skin: Negative.   Neurological: Negative.   Endo/Heme/Allergies: Negative.   Psychiatric/Behavioral: The patient is nervous/anxious.     Objective:  Physical Exam  Constitutional: She is oriented to person, place, and time. She appears well-developed and well-nourished.  HENT:  Head: Normocephalic and atraumatic.  Eyes: Pupils are equal, round, and reactive to light.  Neck: Neck supple. No JVD present. No tracheal deviation present. No thyromegaly present.  Cardiovascular: Normal rate, regular rhythm and intact distal pulses.   Murmur heard. Respiratory: Effort normal and breath sounds normal. No respiratory distress. She has no wheezes.  GI: Soft. There is no tenderness. There is no guarding.  Musculoskeletal:       Left hip: She exhibits decreased range of motion, decreased strength, tenderness and bony tenderness. She exhibits no swelling, no deformity and no laceration.  Lymphadenopathy:    She has no cervical adenopathy.  Neurological: She is alert and oriented to person, place, and time. A sensory deficit (decrease sensation in left leg) is present.  Skin: Skin is warm and dry.  Psychiatric: She has a normal mood and affect.      Labs:  Estimated body mass index is 30.36 kg/(m^2) as calculated from the following:   Height as of 09/23/14: 5\' 6"  (1.676 m).   Weight as of 09/23/14: 85.276 kg (188 lb).   Imaging Review Plain radiographs demonstrate severe degenerative joint disease of the left hip(s). The bone quality appears to be good for age and reported activity level.  Assessment/Plan:  End stage arthritis, left hip(s)  The patient history, physical examination, clinical judgement of the provider and imaging studies are consistent with end stage degenerative joint disease of the left hip(s) and total hip arthroplasty is deemed medically necessary. The treatment options  including medical management, injection therapy, arthroscopy and arthroplasty were discussed at length. The risks and benefits of total hip arthroplasty were presented and reviewed. The risks due to aseptic loosening, infection, stiffness, dislocation/subluxation,  thromboembolic complications and other imponderables were discussed.  The patient acknowledged the explanation, agreed to proceed with the plan and consent was signed. Patient is being admitted for inpatient treatment for surgery, pain control, PT, OT, prophylactic antibiotics, VTE prophylaxis, progressive ambulation and ADL's and discharge planning.The patient is planning to be discharged home with home health services.     [place] Pugh Jamie Hafford   PA-C  12/23/2014, 8:33 PM

## 2014-12-24 ENCOUNTER — Inpatient Hospital Stay (HOSPITAL_COMMUNITY): Payer: 59

## 2014-12-24 ENCOUNTER — Encounter (HOSPITAL_COMMUNITY)
Admission: RE | Disposition: A | Discharge status: Home Health | Payer: Self-pay | Source: Ambulatory Visit | Attending: Orthopedic Surgery

## 2014-12-24 ENCOUNTER — Encounter (HOSPITAL_COMMUNITY): Payer: Self-pay | Admitting: *Deleted

## 2014-12-24 ENCOUNTER — Inpatient Hospital Stay (HOSPITAL_COMMUNITY): Payer: 59 | Admitting: Certified Registered"

## 2014-12-24 ENCOUNTER — Inpatient Hospital Stay (HOSPITAL_COMMUNITY)
Admission: RE | Admit: 2014-12-24 | Discharge: 2014-12-26 | DRG: 470 | Disposition: A | Discharge status: Home Health | Payer: 59 | Source: Ambulatory Visit | Attending: Orthopedic Surgery | Admitting: Orthopedic Surgery

## 2014-12-24 DIAGNOSIS — K219 Gastro-esophageal reflux disease without esophagitis: Secondary | ICD-10-CM | POA: Diagnosis present

## 2014-12-24 DIAGNOSIS — Q6589 Other specified congenital deformities of hip: Secondary | ICD-10-CM

## 2014-12-24 DIAGNOSIS — Z888 Allergy status to other drugs, medicaments and biological substances status: Secondary | ICD-10-CM

## 2014-12-24 DIAGNOSIS — Z881 Allergy status to other antibiotic agents status: Secondary | ICD-10-CM

## 2014-12-24 DIAGNOSIS — Z96649 Presence of unspecified artificial hip joint: Secondary | ICD-10-CM

## 2014-12-24 DIAGNOSIS — J45909 Unspecified asthma, uncomplicated: Secondary | ICD-10-CM | POA: Diagnosis present

## 2014-12-24 DIAGNOSIS — M1612 Unilateral primary osteoarthritis, left hip: Principal | ICD-10-CM | POA: Diagnosis present

## 2014-12-24 DIAGNOSIS — Z87891 Personal history of nicotine dependence: Secondary | ICD-10-CM

## 2014-12-24 DIAGNOSIS — F419 Anxiety disorder, unspecified: Secondary | ICD-10-CM | POA: Diagnosis present

## 2014-12-24 DIAGNOSIS — I10 Essential (primary) hypertension: Secondary | ICD-10-CM | POA: Diagnosis present

## 2014-12-24 DIAGNOSIS — K5090 Crohn's disease, unspecified, without complications: Secondary | ICD-10-CM | POA: Diagnosis present

## 2014-12-24 DIAGNOSIS — Z01812 Encounter for preprocedural laboratory examination: Secondary | ICD-10-CM | POA: Diagnosis not present

## 2014-12-24 DIAGNOSIS — M25552 Pain in left hip: Secondary | ICD-10-CM | POA: Diagnosis present

## 2014-12-24 DIAGNOSIS — Z01810 Encounter for preprocedural cardiovascular examination: Secondary | ICD-10-CM

## 2014-12-24 HISTORY — PX: TOTAL HIP ARTHROPLASTY: SHX124

## 2014-12-24 LAB — TYPE AND SCREEN
ABO/RH(D): O POS
Antibody Screen: NEGATIVE

## 2014-12-24 LAB — ABO/RH: ABO/RH(D): O POS

## 2014-12-24 SURGERY — ARTHROPLASTY, HIP, TOTAL, ANTERIOR APPROACH
Anesthesia: Spinal | Site: Hip | Laterality: Left

## 2014-12-24 MED ORDER — LACTATED RINGERS IV SOLN
INTRAVENOUS | Status: AC
  Administered 2014-12-24: 1000 mL via INTRAVENOUS
  Administered 2014-12-24 (×2): via INTRAVENOUS

## 2014-12-24 MED ORDER — FERROUS SULFATE 325 (65 FE) MG PO TABS
325.0000 mg | ORAL_TABLET | Freq: Three times a day (TID) | ORAL | Status: DC
  Administered 2014-12-25 – 2014-12-26 (×3): 325 mg via ORAL
  Filled 2014-12-24 (×7): qty 1

## 2014-12-24 MED ORDER — PROPOFOL 10 MG/ML IV BOLUS
INTRAVENOUS | Status: AC
  Filled 2014-12-24: qty 20

## 2014-12-24 MED ORDER — BUPIVACAINE HCL (PF) 0.5 % IJ SOLN
INTRAMUSCULAR | Status: DC | PRN
  Administered 2014-12-24: 3 mL

## 2014-12-24 MED ORDER — CEFAZOLIN SODIUM-DEXTROSE 2-3 GM-% IV SOLR
INTRAVENOUS | Status: AC
  Filled 2014-12-24: qty 50

## 2014-12-24 MED ORDER — MEPERIDINE HCL 50 MG/ML IJ SOLN
INTRAMUSCULAR | Status: AC
  Filled 2014-12-24: qty 1

## 2014-12-24 MED ORDER — PROPOFOL INFUSION 10 MG/ML OPTIME
INTRAVENOUS | Status: DC | PRN
  Administered 2014-12-24: 100 ug/kg/min via INTRAVENOUS

## 2014-12-24 MED ORDER — ACETAMINOPHEN 10 MG/ML IV SOLN
1000.0000 mg | Freq: Once | INTRAVENOUS | Status: AC
  Administered 2014-12-24: 1000 mg via INTRAVENOUS
  Filled 2014-12-24: qty 100

## 2014-12-24 MED ORDER — SODIUM CHLORIDE 0.9 % IR SOLN
Status: DC | PRN
  Administered 2014-12-24: 1000 mL

## 2014-12-24 MED ORDER — NON FORMULARY: 40.0000 mg | Freq: Two times a day (BID) | Status: DC

## 2014-12-24 MED ORDER — CEFAZOLIN SODIUM-DEXTROSE 2-3 GM-% IV SOLR
2.0000 g | INTRAVENOUS | Status: AC
  Administered 2014-12-24: 2 g via INTRAVENOUS

## 2014-12-24 MED ORDER — HYDROMORPHONE HCL 1 MG/ML IJ SOLN
0.5000 mg | INTRAMUSCULAR | Status: DC | PRN
  Administered 2014-12-24: 0.5 mg via INTRAVENOUS
  Administered 2014-12-25 (×3): 1 mg via INTRAVENOUS
  Filled 2014-12-24: qty 1
  Filled 2014-12-24: qty 2
  Filled 2014-12-24 (×2): qty 1

## 2014-12-24 MED ORDER — DEXAMETHASONE SODIUM PHOSPHATE 10 MG/ML IJ SOLN
INTRAMUSCULAR | Status: DC | PRN
  Administered 2014-12-24: 10 mg via INTRAVENOUS

## 2014-12-24 MED ORDER — PROMETHAZINE HCL 25 MG PO TABS: 12.5000 mg | ORAL_TABLET | Freq: Four times a day (QID) | ORAL | Status: DC | PRN

## 2014-12-24 MED ORDER — MAGNESIUM CITRATE PO SOLN: 1.0000 | Freq: Once | ORAL | Status: AC | PRN

## 2014-12-24 MED ORDER — LACTATED RINGERS IV SOLN: INTRAVENOUS | Status: DC

## 2014-12-24 MED ORDER — MEPERIDINE HCL 50 MG/ML IJ SOLN
6.2500 mg | INTRAMUSCULAR | Status: DC | PRN
  Administered 2014-12-24: 12.5 mg via INTRAVENOUS

## 2014-12-24 MED ORDER — PROMETHAZINE HCL 25 MG/ML IJ SOLN: 6.2500 mg | Freq: Four times a day (QID) | INTRAMUSCULAR | Status: DC | PRN

## 2014-12-24 MED ORDER — MIDAZOLAM HCL 2 MG/2ML IJ SOLN
INTRAMUSCULAR | Status: AC
  Filled 2014-12-24: qty 2

## 2014-12-24 MED ORDER — FENTANYL CITRATE 0.05 MG/ML IJ SOLN
INTRAMUSCULAR | Status: DC | PRN
Start: 2014-12-24 — End: 2014-12-24
  Administered 2014-12-24: 100 ug via INTRAVENOUS

## 2014-12-24 MED ORDER — METHOCARBAMOL 500 MG PO TABS
500.0000 mg | ORAL_TABLET | Freq: Four times a day (QID) | ORAL | Status: DC | PRN
  Administered 2014-12-24 – 2014-12-25 (×3): 500 mg via ORAL
  Filled 2014-12-24 (×3): qty 1

## 2014-12-24 MED ORDER — CEFAZOLIN SODIUM-DEXTROSE 2-3 GM-% IV SOLR
2.0000 g | Freq: Four times a day (QID) | INTRAVENOUS | Status: AC
  Administered 2014-12-24 – 2014-12-25 (×2): 2 g via INTRAVENOUS
  Filled 2014-12-24 (×2): qty 50

## 2014-12-24 MED ORDER — CHLORHEXIDINE GLUCONATE 4 % EX LIQD: 60.0000 mL | Freq: Once | CUTANEOUS | Status: DC

## 2014-12-24 MED ORDER — ONDANSETRON HCL 4 MG/2ML IJ SOLN: 4.0000 mg | Freq: Four times a day (QID) | INTRAMUSCULAR | Status: DC | PRN

## 2014-12-24 MED ORDER — ONDANSETRON HCL 4 MG/2ML IJ SOLN
INTRAMUSCULAR | Status: AC
  Filled 2014-12-24: qty 2

## 2014-12-24 MED ORDER — PHENYLEPHRINE 40 MCG/ML (10ML) SYRINGE FOR IV PUSH (FOR BLOOD PRESSURE SUPPORT)
PREFILLED_SYRINGE | INTRAVENOUS | Status: AC
  Filled 2014-12-24: qty 10

## 2014-12-24 MED ORDER — PROMETHAZINE HCL 25 MG/ML IJ SOLN: 6.2500 mg | INTRAMUSCULAR | Status: DC | PRN

## 2014-12-24 MED ORDER — ALPRAZOLAM 0.25 MG PO TABS
0.2500 mg | ORAL_TABLET | Freq: Every evening | ORAL | Status: DC | PRN
  Administered 2014-12-24 – 2014-12-25 (×2): 0.25 mg via ORAL
  Filled 2014-12-24 (×2): qty 1

## 2014-12-24 MED ORDER — DIPHENHYDRAMINE HCL 25 MG PO CAPS: 25.0000 mg | ORAL_CAPSULE | Freq: Four times a day (QID) | ORAL | Status: DC | PRN

## 2014-12-24 MED ORDER — MENTHOL 3 MG MT LOZG
1.0000 | LOZENGE | OROMUCOSAL | Status: DC | PRN
  Filled 2014-12-24: qty 9

## 2014-12-24 MED ORDER — BISACODYL 10 MG RE SUPP: 10.0000 mg | Freq: Every day | RECTAL | Status: DC | PRN

## 2014-12-24 MED ORDER — ONDANSETRON HCL 4 MG/2ML IJ SOLN
INTRAMUSCULAR | Status: DC | PRN
  Administered 2014-12-24: 4 mg via INTRAVENOUS

## 2014-12-24 MED ORDER — POLYETHYLENE GLYCOL 3350 17 G PO PACK
17.0000 g | PACK | Freq: Two times a day (BID) | ORAL | Status: DC
  Administered 2014-12-24 – 2014-12-26 (×4): 17 g via ORAL

## 2014-12-24 MED ORDER — AMLODIPINE BESYLATE 2.5 MG PO TABS
2.5000 mg | ORAL_TABLET | Freq: Every day | ORAL | Status: DC
  Administered 2014-12-25 – 2014-12-26 (×2): 2.5 mg via ORAL
  Filled 2014-12-24 (×2): qty 1

## 2014-12-24 MED ORDER — DEXTROSE 5 % IV SOLN
500.0000 mg | Freq: Four times a day (QID) | INTRAVENOUS | Status: DC | PRN
  Administered 2014-12-24: 500 mg via INTRAVENOUS
  Filled 2014-12-24 (×2): qty 5

## 2014-12-24 MED ORDER — LOSARTAN POTASSIUM 50 MG PO TABS
100.0000 mg | ORAL_TABLET | Freq: Every day | ORAL | Status: DC
  Administered 2014-12-24 – 2014-12-26 (×3): 100 mg via ORAL
  Filled 2014-12-24 (×3): qty 2

## 2014-12-24 MED ORDER — MIDAZOLAM HCL 5 MG/5ML IJ SOLN
INTRAMUSCULAR | Status: DC | PRN
  Administered 2014-12-24: 2 mg via INTRAVENOUS
  Administered 2014-12-24 (×2): 1 mg via INTRAVENOUS

## 2014-12-24 MED ORDER — HYDROCODONE-ACETAMINOPHEN 7.5-325 MG PO TABS
1.0000 | ORAL_TABLET | ORAL | Status: DC
  Administered 2014-12-24 (×2): 2 via ORAL
  Administered 2014-12-25 (×3): 1 via ORAL
  Administered 2014-12-25: 2 via ORAL
  Administered 2014-12-25 (×2): 1 via ORAL
  Administered 2014-12-26: 2 via ORAL
  Administered 2014-12-26: 1 via ORAL
  Filled 2014-12-24: qty 2
  Filled 2014-12-24: qty 1
  Filled 2014-12-24: qty 2
  Filled 2014-12-24 (×3): qty 1
  Filled 2014-12-24 (×2): qty 2
  Filled 2014-12-24 (×2): qty 1

## 2014-12-24 MED ORDER — BUPIVACAINE HCL (PF) 0.5 % IJ SOLN
INTRAMUSCULAR | Status: AC
  Filled 2014-12-24: qty 30

## 2014-12-24 MED ORDER — ALUM & MAG HYDROXIDE-SIMETH 200-200-20 MG/5ML PO SUSP: 30.0000 mL | ORAL | Status: DC | PRN

## 2014-12-24 MED ORDER — DEXAMETHASONE SODIUM PHOSPHATE 10 MG/ML IJ SOLN
10.0000 mg | Freq: Once | INTRAMUSCULAR | Status: AC
  Administered 2014-12-25: 10 mg via INTRAVENOUS
  Filled 2014-12-24: qty 1

## 2014-12-24 MED ORDER — ESOMEPRAZOLE MAGNESIUM 40 MG PO CPDR
40.0000 mg | DELAYED_RELEASE_CAPSULE | Freq: Two times a day (BID) | ORAL | Status: DC
  Administered 2014-12-24 – 2014-12-26 (×4): 40 mg via ORAL
  Filled 2014-12-24 (×6): qty 1

## 2014-12-24 MED ORDER — ONDANSETRON HCL 4 MG PO TABS: 4.0000 mg | ORAL_TABLET | Freq: Four times a day (QID) | ORAL | Status: DC | PRN

## 2014-12-24 MED ORDER — SODIUM CHLORIDE 0.9 % IV SOLN
100.0000 mL/h | INTRAVENOUS | Status: DC
  Administered 2014-12-24: 100 mL/h via INTRAVENOUS
  Filled 2014-12-24 (×5): qty 1000

## 2014-12-24 MED ORDER — PHENYLEPHRINE HCL 10 MG/ML IJ SOLN
INTRAMUSCULAR | Status: AC
  Filled 2014-12-24: qty 1

## 2014-12-24 MED ORDER — STERILE WATER FOR IRRIGATION IR SOLN
Status: DC | PRN
  Administered 2014-12-24: 1500 mL

## 2014-12-24 MED ORDER — PROPOFOL 10 MG/ML IV BOLUS
INTRAVENOUS | Status: DC | PRN
  Administered 2014-12-24 (×3): 20 mg via INTRAVENOUS

## 2014-12-24 MED ORDER — CELECOXIB 200 MG PO CAPS
200.0000 mg | ORAL_CAPSULE | Freq: Two times a day (BID) | ORAL | Status: DC
  Administered 2014-12-24 – 2014-12-26 (×4): 200 mg via ORAL
  Filled 2014-12-24 (×5): qty 1

## 2014-12-24 MED ORDER — FENTANYL CITRATE 0.05 MG/ML IJ SOLN
INTRAMUSCULAR | Status: AC
Start: 2014-12-24 — End: 2014-12-24
  Filled 2014-12-24: qty 2

## 2014-12-24 MED ORDER — HYDROMORPHONE HCL 1 MG/ML IJ SOLN
0.2500 mg | INTRAMUSCULAR | Status: DC | PRN
  Administered 2014-12-24 (×2): 0.5 mg via INTRAVENOUS

## 2014-12-24 MED ORDER — PHENYLEPHRINE HCL 10 MG/ML IJ SOLN
10.0000 mg | INTRAVENOUS | Status: DC | PRN
  Administered 2014-12-24: 10 ug/min via INTRAVENOUS

## 2014-12-24 MED ORDER — TRANEXAMIC ACID 100 MG/ML IV SOLN
1000.0000 mg | INTRAVENOUS | Status: AC
  Administered 2014-12-24: 1000 mg via INTRAVENOUS
  Filled 2014-12-24: qty 10

## 2014-12-24 MED ORDER — PHENYLEPHRINE HCL 10 MG/ML IJ SOLN
INTRAMUSCULAR | Status: DC | PRN
  Administered 2014-12-24 (×3): 80 ug via INTRAVENOUS

## 2014-12-24 MED ORDER — DEXAMETHASONE SODIUM PHOSPHATE 10 MG/ML IJ SOLN
INTRAMUSCULAR | Status: AC
  Filled 2014-12-24: qty 1

## 2014-12-24 MED ORDER — HYDROMORPHONE HCL 1 MG/ML IJ SOLN
INTRAMUSCULAR | Status: AC
  Filled 2014-12-24: qty 1

## 2014-12-24 MED ORDER — ASPIRIN EC 325 MG PO TBEC
325.0000 mg | DELAYED_RELEASE_TABLET | Freq: Two times a day (BID) | ORAL | Status: DC
  Administered 2014-12-25 – 2014-12-26 (×3): 325 mg via ORAL
  Filled 2014-12-24 (×5): qty 1

## 2014-12-24 MED ORDER — MIRTAZAPINE 45 MG PO TABS
45.0000 mg | ORAL_TABLET | Freq: Every day | ORAL | Status: DC
  Administered 2014-12-24: 15 mg via ORAL
  Administered 2014-12-25: 45 mg via ORAL
  Filled 2014-12-24 (×3): qty 1

## 2014-12-24 MED ORDER — DOCUSATE SODIUM 100 MG PO CAPS
100.0000 mg | ORAL_CAPSULE | Freq: Two times a day (BID) | ORAL | Status: DC
  Administered 2014-12-24 – 2014-12-26 (×4): 100 mg via ORAL

## 2014-12-24 MED ORDER — PHENOL 1.4 % MT LIQD: 1.0000 | OROMUCOSAL | Status: DC | PRN

## 2014-12-24 SURGICAL SUPPLY — 45 items
ADH SKN CLS APL DERMABOND .7 (GAUZE/BANDAGES/DRESSINGS) ×1
BAG DECANTER FOR FLEXI CONT (MISCELLANEOUS) IMPLANT
BAG SPEC THK2 15X12 ZIP CLS (MISCELLANEOUS)
BAG ZIPLOCK 12X15 (MISCELLANEOUS) IMPLANT
CAPT HIP TOTAL 2 ×2 IMPLANT
COVER PERINEAL POST (MISCELLANEOUS) ×2 IMPLANT
DERMABOND ADVANCED (GAUZE/BANDAGES/DRESSINGS) ×1
DERMABOND ADVANCED .7 DNX12 (GAUZE/BANDAGES/DRESSINGS) IMPLANT
DRAPE C-ARM 42X120 X-RAY (DRAPES) ×2 IMPLANT
DRAPE STERI IOBAN 125X83 (DRAPES) ×2 IMPLANT
DRAPE U-SHAPE 47X51 STRL (DRAPES) ×6 IMPLANT
DRSG AQUACEL AG ADV 3.5X10 (GAUZE/BANDAGES/DRESSINGS) ×2 IMPLANT
DURAPREP 26ML APPLICATOR (WOUND CARE) ×2 IMPLANT
ELECT BLADE TIP CTD 4 INCH (ELECTRODE) ×2 IMPLANT
ELECT PENCIL ROCKER SW 15FT (MISCELLANEOUS) IMPLANT
ELECT REM PT RETURN 15FT ADLT (MISCELLANEOUS) IMPLANT
ELECT REM PT RETURN 9FT ADLT (ELECTROSURGICAL) ×2
ELECTRODE REM PT RTRN 9FT ADLT (ELECTROSURGICAL) ×1 IMPLANT
FACESHIELD WRAPAROUND (MASK) ×8 IMPLANT
FACESHIELD WRAPAROUND OR TEAM (MASK) ×4 IMPLANT
GLOVE BIOGEL PI IND STRL 7.5 (GLOVE) ×1 IMPLANT
GLOVE BIOGEL PI IND STRL 8.5 (GLOVE) ×1 IMPLANT
GLOVE BIOGEL PI INDICATOR 7.5 (GLOVE) ×1
GLOVE BIOGEL PI INDICATOR 8.5 (GLOVE) ×1
GLOVE ECLIPSE 8.0 STRL XLNG CF (GLOVE) ×4 IMPLANT
GLOVE ORTHO TXT STRL SZ7.5 (GLOVE) ×2 IMPLANT
GOWN SPEC L3 XXLG W/TWL (GOWN DISPOSABLE) ×2 IMPLANT
GOWN STRL REUS W/TWL LRG LVL3 (GOWN DISPOSABLE) ×2 IMPLANT
HOLDER FOLEY CATH W/STRAP (MISCELLANEOUS) ×2 IMPLANT
KIT BASIN OR (CUSTOM PROCEDURE TRAY) ×2 IMPLANT
LIQUID BAND (GAUZE/BANDAGES/DRESSINGS) ×2 IMPLANT
NDL SAFETY ECLIPSE 18X1.5 (NEEDLE) IMPLANT
NEEDLE HYPO 18GX1.5 SHARP (NEEDLE)
PACK TOTAL JOINT (CUSTOM PROCEDURE TRAY) ×2 IMPLANT
SAW OSC TIP CART 19.5X105X1.3 (SAW) ×2 IMPLANT
SUT MNCRL AB 4-0 PS2 18 (SUTURE) ×2 IMPLANT
SUT VIC AB 1 CT1 36 (SUTURE) ×6 IMPLANT
SUT VIC AB 2-0 CT1 27 (SUTURE) ×4
SUT VIC AB 2-0 CT1 TAPERPNT 27 (SUTURE) ×2 IMPLANT
SUT VLOC 180 0 24IN GS25 (SUTURE) ×2 IMPLANT
SYR 50ML LL SCALE MARK (SYRINGE) IMPLANT
TOWEL OR 17X26 10 PK STRL BLUE (TOWEL DISPOSABLE) ×2 IMPLANT
TOWEL OR NON WOVEN STRL DISP B (DISPOSABLE) IMPLANT
TRAY FOLEY CATH 14FRSI W/METER (CATHETERS) ×2 IMPLANT
WATER STERILE IRR 1500ML POUR (IV SOLUTION) ×2 IMPLANT

## 2014-12-24 NOTE — Anesthesia Postprocedure Evaluation (Signed)
Anesthesia Post Note  Patient: Nicole [name]  Procedure(s) Performed: Procedure(s) (LRB): LEFT TOTAL HIP ARTHROPLASTY ANTERIOR APPROACH (Left)  Anesthesia type: MAC/SAB  Patient location: PACU  Post pain: Pain level controlled  Post assessment: Patient's Cardiovascular Status Stable  Last Vitals:  Filed Vitals:   12/24/14 1630  BP: 127/67  Pulse: 76  Temp:   Resp: 19    Post vital signs: Reviewed and stable  Level of consciousness: sedated  Complications: No apparent anesthesia complications

## 2014-12-24 NOTE — Plan of Care (Signed)
Problem: Consults Goal: Diagnosis- Total Joint Replacement Left anterior hip     

## 2014-12-24 NOTE — Op Note (Signed)
NAME:  Nicole [name]                ACCOUNT NO.: [id number]      MEDICAL RECORD NO.: [id number]      FACILITY:  [facility]      PHYSICIAN:  Paralee Cancel D  DATE OF BIRTH:  [birth year]     DATE OF PROCEDURE:  12/24/2014                                 OPERATIVE REPORT         PREOPERATIVE DIAGNOSIS: Left  hip osteoarthritis.      POSTOPERATIVE DIAGNOSIS:  Left hip osteoarthritis, secondary to hip dysplasia     PROCEDURE:  Left total hip replacement through an anterior approach   utilizing DePuy THR system, component size 34mm pinnacle cup, a size 32+4 neutral   Altrex liner, a size 6Hi Tri Lock stem with a 32+1 delta ceramic   ball.      SURGEON:  Pietro Cassis. Alvan Dame, M.D.      ASSISTANT:  Danae Orleans, PA-C     ANESTHESIA:  Spinal.      SPECIMENS:  None.      COMPLICATIONS:  None.      BLOOD LOSS:  300 cc     DRAINS:  None.      INDICATION OF THE PROCEDURE:  Nicole [name] is a 59 y.o. female who had   presented to office for evaluation of left hip pain.  Radiographs revealed   progressive degenerative changes with bone-on-bone   articulation to the  hip joint.  The patient had painful limited range of   motion significantly affecting their overall quality of life.  The patient was failing to    respond to conservative measures, and at this point was ready   to proceed with more definitive measures.  The patient has noted progressive   degenerative changes in his hip, progressive problems and dysfunction   with regarding the hip prior to surgery.  Consent was obtained for   benefit of pain relief.  Specific risk of infection, DVT, component   failure, dislocation, need for revision surgery, as well discussion of   the anterior versus posterior approach were reviewed.  Consent was   obtained for benefit of anterior pain relief through an anterior   approach.      PROCEDURE IN DETAIL:  The patient was brought to operative theater.   Once adequate  anesthesia, preoperative antibiotics, 2gm of Ancef administered.   The patient was positioned supine on the OSI Hanna table.  Once adequate   padding of boney process was carried out, we had predraped out the hip, and  used fluoroscopy to confirm orientation of the pelvis and position.      The left hip was then prepped and draped from proximal iliac crest to   mid thigh with shower curtain technique.      Time-out was performed identifying the patient, planned procedure, and   extremity.     An incision was then made 2 cm distal and lateral to the   anterior superior iliac spine extending over the orientation of the   tensor fascia lata muscle and sharp dissection was carried down to the   fascia of the muscle and protractor placed in the soft tissues.      The fascia was then incised.  The muscle belly was identified  and swept   laterally and retractor placed along the superior neck.  Following   cauterization of the circumflex vessels and removing some pericapsular   fat, a second cobra retractor was placed on the inferior neck.  A third   retractor was placed on the anterior acetabulum after elevating the   anterior rectus.  A L-capsulotomy was along the line of the   superior neck to the trochanteric fossa, then extended proximally and   distally.  Tag sutures were placed and the retractors were then placed   intracapsular.  We then identified the trochanteric fossa and   orientation of my neck cut, confirmed this radiographically   and then made a neck osteotomy with the femur on traction.  The femoral   head was removed without difficulty or complication.  Traction was let   off and retractors were placed posterior and anterior around the   acetabulum.      The labrum and foveal tissue were debrided.  I began reaming with a 37mm   reamer and reamed up to 13mm reamer with good bony bed preparation and a 56mm   cup was chosen.  The final 7mm Pinnacle cup was then impacted under  fluoroscopy  to confirm the depth of penetration and orientation with respect to   abduction.  A screw was placed followed by the hole eliminator.  The final   32+4 neutral Altrex liner was impacted with good visualized rim fit.  The cup was positioned anatomically within the acetabular portion of the pelvis.      At this point, the femur was rolled at 80 degrees.  Further capsule was   released off the inferior aspect of the femoral neck.  I then   released the superior capsule proximally.  The hook was placed laterally   along the femur and elevated manually and held in position with the bed   hook.  The leg was then extended and adducted with the leg rolled to 100   degrees of external rotation.  Once the proximal femur was fully   exposed, I used a box osteotome to set orientation.  I then began   broaching with the starting chili pepper broach and passed this by hand and then broached up to 6.  With the 6 broach in place I chose a high offset neck and did a trial reduction.  The offset was appropriate, leg lengths   appeared to be equal, confirmed radiographically with X-ray positioned correctly over the pelvis.   Given these findings, I went ahead and dislocated the hip, repositioned all   retractors and positioned the right hip in the extended and abducted position.  The final 6 Hi Tri Lock stem was   chosen and it was impacted down to the level of neck cut.  Based on this   and the trial reduction, a 32+1 delta ceramic ball was chosen and   impacted onto a clean and dry trunnion, and the hip was reduced.  The   hip had been irrigated throughout the case again at this point.  I did   reapproximate the superior capsular leaflet to the anterior leaflet   using #1 Vicryl.  The fascia of the   tensor fascia lata muscle was then reapproximated using #1 Vicryl and #0 V-lock sutures.  The   remaining wound was closed with 2-0 Vicryl and running 4-0 Monocryl.   The hip was cleaned, dried, and  dressed sterilely using Dermabond and   Aquacel  dressing.  She was then brought   to recovery room in stable condition tolerating the procedure well.    Danae Orleans, PA-C was present for the entirety of the case involved from   preoperative positioning, perioperative retractor management, general   facilitation of the case, as well as primary wound closure as assistant.            Pietro Cassis Alvan Dame, M.D.        12/24/2014 3:22 PM

## 2014-12-24 NOTE — Anesthesia Preprocedure Evaluation (Signed)
Anesthesia Evaluation  Patient identified by MRN, date of birth, ID band Patient awake    Reviewed: Allergy & Precautions, NPO status , Patient's Chart, lab work & pertinent test results  History of Anesthesia Complications (+) PONV  Airway Mallampati: II  TM Distance: >3 FB Neck ROM: Full    Dental no notable dental hx.    Pulmonary asthma , former smoker,  breath sounds clear to auscultation  Pulmonary exam normal       Cardiovascular hypertension, Pt. on medications Rhythm:Regular Rate:Normal     Neuro/Psych negative neurological ROS  negative psych ROS   GI/Hepatic Neg liver ROS, GERD-  Medicated and Controlled,  Endo/Other  negative endocrine ROS  Renal/GU negative Renal ROS  negative genitourinary   Musculoskeletal negative musculoskeletal ROS (+)   Abdominal   Peds negative pediatric ROS (+)  Hematology negative hematology ROS (+)   Anesthesia Other Findings   Reproductive/Obstetrics negative OB ROS                             Anesthesia Physical Anesthesia Plan  ASA: II  Anesthesia Plan: Spinal   Post-op Pain Management:    Induction: Intravenous  Airway Management Planned: Simple Face Mask  Additional Equipment:   Intra-op Plan:   Post-operative Plan:   Informed Consent: I have reviewed the patients History and Physical, chart, labs and discussed the procedure including the risks, benefits and alternatives for the proposed anesthesia with the patient or authorized representative who has indicated his/her understanding and acceptance.   Dental advisory given  Plan Discussed with: CRNA  Anesthesia Plan Comments:         Anesthesia Quick Evaluation

## 2014-12-24 NOTE — Anesthesia Procedure Notes (Signed)
Spinal Patient location during procedure: OR Staffing Anesthesiologist: Bijan Ridgley Performed by: anesthesiologist  Preanesthetic Checklist Completed: patient identified, site marked, surgical consent, pre-op evaluation, timeout performed, IV checked, risks and benefits discussed and monitors and equipment checked Spinal Block Patient position: sitting Prep: Betadine Patient monitoring: heart rate, continuous pulse ox and blood pressure Approach: right paramedian Location: L3-4 Injection technique: single-shot Needle Needle type: Spinocan  Needle gauge: 22 G Needle length: 9 cm Additional Notes Expiration date of kit checked and confirmed. Patient tolerated procedure well, without complications.     

## 2014-12-24 NOTE — Transfer of Care (Signed)
Immediate Anesthesia Transfer of Care Note  Patient: Nicole [name]  Procedure(s) Performed: Procedure(s): LEFT TOTAL HIP ARTHROPLASTY ANTERIOR APPROACH (Left)  Patient Location: PACU  Anesthesia Type:Regional  Level of Consciousness: awake, alert  and oriented  Airway & Oxygen Therapy: Patient Spontanous Breathing and Patient connected to face mask oxygen  Post-op Assessment: Report given to RN and Post -op Vital signs reviewed and stable  Post vital signs: Reviewed and stable  Last Vitals:  Filed Vitals:   12/24/14 1121  BP: 132/96  Pulse: 115  Temp: 36.6 C  Resp: 20    Complications: No apparent anesthesia complications

## 2014-12-24 NOTE — Interval H&P Note (Signed)
History and Physical Interval Note:  12/24/2014 1:21 PM  Nicole [name]  has presented today for surgery, with the diagnosis of LEFT HIP OA   The various methods of treatment have been discussed with the patient and family. After consideration of risks, benefits and other options for treatment, the patient has consented to  Procedure(s): LEFT TOTAL HIP ARTHROPLASTY ANTERIOR APPROACH (Left) as a surgical intervention .  The patient's history has been reviewed, patient examined, no change in status, stable for surgery.  I have reviewed the patient's chart and labs.  Questions were answered to the patient's satisfaction.     Mauri Pole

## 2014-12-25 ENCOUNTER — Encounter (HOSPITAL_COMMUNITY): Payer: Self-pay | Admitting: Orthopedic Surgery

## 2014-12-25 LAB — BASIC METABOLIC PANEL
Anion gap: 7 (ref 5–15)
BUN: 11 mg/dL (ref 6–23)
CHLORIDE: 107 mmol/L (ref 96–112)
CO2: 25 mmol/L (ref 19–32)
Calcium: 8.8 mg/dL (ref 8.4–10.5)
Creatinine, Ser: 0.74 mg/dL (ref 0.50–1.10)
GFR calc non Af Amer: >90 mL/min (ref >90)
Glucose, Bld: 160 mg/dL — ABNORMAL HIGH (ref 70–99)
POTASSIUM: 4.1 mmol/L (ref 3.5–5.1)
Sodium: 139 mmol/L (ref 135–145)

## 2014-12-25 LAB — CBC
HCT: 35 % — ABNORMAL LOW (ref 36.0–46.0)
Hemoglobin: 11.4 g/dL — ABNORMAL LOW (ref 12.0–15.0)
MCH: 30.1 pg (ref 26.0–34.0)
MCHC: 32.6 g/dL (ref 30.0–36.0)
MCV: 92.3 fL (ref 78.0–100.0)
Platelets: 256 10*3/uL (ref 150–400)
RBC: 3.79 MIL/uL — ABNORMAL LOW (ref 3.87–5.11)
RDW: 12.9 % (ref 11.5–15.5)
WBC: 9.7 10*3/uL (ref 4.0–10.5)

## 2014-12-25 NOTE — Care Management Note (Signed)
    Page 1 of 1   12/25/2014     1:21:47 PM CARE MANAGEMENT NOTE 12/25/2014  Patient:  LAKETHIA, COPPESS   Account Number:  [id number]  Date Initiated:  12/25/2014  Documentation initiated by:  [facility]  Subjective/Objective Assessment:   adm: LEFT TOTAL HIP ARTHROPLASTY ANTERIOR APPROACH (Left)     Action/Plan:   discharge planning   Anticipated DC Date:  12/25/2014   Anticipated DC Plan:  [place]  CM consult      [facility]  HOME HEALTH   Choice offered to / List presented to:  C-1 Patient           [place]   Status of service:  Completed, signed off Medicare Important Message given?   (If response is "NO", the following Medicare IM given date fields will be blank) Date Medicare IM given:   Medicare IM given by:   Date Additional Medicare IM given:   Additional Medicare IM given by:    Discharge Disposition:  [place]  Per UR Regulation:  Reviewed for med. necessity/level of care/duration of stay  If discussed at [place] of Stay Meetings, dates discussed:    Comments:  12/25/14 13:10 Cm met with pt in room to offer choice of home health agency.  Pt chooses [facility] HHPT. address and contact information verified by pt.  Referral emailed to [employer], Tim.  Pt states she has a rolling walker and does not need a 3n1.  No other Cm needs were communicated.  Mariane Masters, BSN, CM [phone].

## 2014-12-25 NOTE — Evaluation (Signed)
Physical Therapy Evaluation Patient Details Name: Nicole [name] MRN: [id number] DOB: June 23, 1956 Today's Date: 12/25/2014   History of Present Illness  L THR  Clinical Impression  Pt s/p L THR presents with decreased L LE strength/ROM and post op pain limiting functional mobility.  Pt should progress well to d.c home with family assist and HHPT follow up.    Follow Up Recommendations Home health PT    Equipment Recommendations  None recommended by PT    Recommendations for Other Services OT consult     Precautions / Restrictions Precautions Precautions: Fall Restrictions Weight Bearing Restrictions: No Other Position/Activity Restrictions: WBAT      Mobility  Bed Mobility Overal bed mobility: Needs Assistance Bed Mobility: Supine to Sit;Sit to Supine     Supine to sit: Min assist Sit to supine: Min assist   General bed mobility comments: cues for sequence and use of R LE to self assist  Transfers Overall transfer level: Needs assistance Equipment used: Rolling walker (2 wheeled) Transfers: Sit to/from Stand Sit to Stand: Min assist         General transfer comment: cues for LE management and use of UEs to self assist  Ambulation/Gait Ambulation/Gait assistance: Min assist Ambulation Distance (Feet): 110 Feet Assistive device: Rolling walker (2 wheeled) Gait Pattern/deviations: Step-to pattern;Decreased step length - right;Decreased step length - left;Shuffle;Trunk flexed Gait velocity: decr Gait velocity interpretation: Below normal speed for age/gender General Gait Details: cues for posture, position from RW, ER on L and initial sequence  Stairs            Wheelchair Mobility    Modified Rankin (Stroke Patients Only)       Balance                                             Pertinent Vitals/Pain Pain Assessment: 0-10 Pain Score: 3  Pain Location: L hip/thigh Pain Descriptors / Indicators: Aching;Sore Pain  Intervention(s): Limited activity within patient's tolerance;Monitored during session;Premedicated before session;Ice applied    Home Living Family/patient expects to be discharged to:: Private residence Living Arrangements: Parent Available Help at Discharge: Family Type of Home: House Home Access: Stairs to enter Entrance Stairs-Rails: None Entrance Stairs-Number of Steps: 2 Home Layout: One level Home Equipment: Environmental consultant - 2 wheels;Cane - single point      Prior Function Level of Independence: Independent;Independent with assistive device(s)               Hand Dominance   Dominant Hand: Right    Extremity/Trunk Assessment   Upper Extremity Assessment: Overall WFL for tasks assessed           Lower Extremity Assessment: LLE deficits/detail   LLE Deficits / Details: 3-/5 hip strength with AAROM at hip to 90 flex and 20 abd  Cervical / Trunk Assessment: Normal  Communication   Communication: No difficulties  Cognition Arousal/Alertness: Awake/alert Behavior During Therapy: WFL for tasks assessed/performed Overall Cognitive Status: Within Functional Limits for tasks assessed                      General Comments      Exercises Total Joint Exercises Ankle Circles/Pumps: AROM;Both;15 reps;Supine Quad Sets: AROM;Both;10 reps;Supine Heel Slides: AAROM;Left;20 reps;Supine Hip ABduction/ADduction: AAROM;Left;15 reps;Supine      Assessment/Plan    PT Assessment Patient needs continued PT services  PT Diagnosis  Difficulty walking   PT Problem List Decreased strength;Decreased range of motion;Decreased activity tolerance;Decreased mobility;Decreased knowledge of use of DME;Pain  PT Treatment Interventions DME instruction;Gait training;Stair training;Functional mobility training;Therapeutic activities;Therapeutic exercise;Patient/family education   PT Goals (Current goals can be found in the Care Plan section) Acute Rehab PT Goals Patient Stated Goal:  Resume previous lifestyle with decreased pain PT Goal Formulation: With patient Time For Goal Achievement: 01/01/15 Potential to Achieve Goals: Good    Frequency 7X/week   Barriers to discharge        Co-evaluation               End of Session Equipment Utilized During Treatment: Gait belt Activity Tolerance: Patient tolerated treatment well Patient left: in bed;with call bell/phone within reach;with family/visitor present Nurse Communication: Mobility status         Time: 4784-1282 PT Time Calculation (min) (ACUTE ONLY): 38 min   Charges:   PT Evaluation $Initial PT Evaluation Tier I: 1 Procedure PT Treatments $Gait Training: 8-22 mins $Therapeutic Exercise: 8-22 mins   PT G Codes:        Merina Behrendt 2014/12/27, 12:44 PM

## 2014-12-25 NOTE — Progress Notes (Signed)
Patient ID: TERYN BOEREMA, female   DOB: 06-28-1956, 59 y.o.   MRN: [id number] Subjective: 1 Day Post-Op Procedure(s) (LRB): LEFT TOTAL HIP ARTHROPLASTY ANTERIOR APPROACH (Left)    Patient reports pain as moderate.  Sore, not much sleep last night.  Objective:   VITALS:   Filed Vitals:   12/25/14 0634  BP: 116/61  Pulse: 84  Temp: 98.2 F (36.8 C)  Resp: 16    Neurovascular intact Incision: dressing C/D/I  LABS  Recent Labs  12/25/14 0433  HGB 11.4*  HCT 35.0*  WBC 9.7  PLT 256     Recent Labs  12/25/14 0433  NA 139  K 4.1  BUN 11  CREATININE 0.74  GLUCOSE 160*    No results for input(s): LABPT, INR in the last 72 hours.   Assessment/Plan: 1 Day Post-Op Procedure(s) (LRB): LEFT TOTAL HIP ARTHROPLASTY ANTERIOR APPROACH (Left)   Advance diet Up with therapy Plan for discharge tomorrow, to home with HHPT  Reviewed OR findings and pictures as well as X-rays showing component position, etc

## 2014-12-25 NOTE — Progress Notes (Signed)
CSW consulted for SNF placement. PN reviewed. Pt plans to return home following  hospital d/c. PT recommends HHPT. RNCM assisting with d/c planning.  Werner Lean LCSW [phone]

## 2014-12-25 NOTE — Progress Notes (Signed)
Physical Therapy Treatment Patient Details Name: KHARIS LAPENNA MRN: [id number] DOB: May 29, 1956 Today's Date: 10-Jan-2015    History of Present Illness L THR    PT Comments    Steady progress with pt pain ltd but motivated.  Follow Up Recommendations  Home health PT     Equipment Recommendations  None recommended by PT    Recommendations for Other Services OT consult     Precautions / Restrictions Precautions Precautions: Fall Restrictions Weight Bearing Restrictions: No Other Position/Activity Restrictions: WBAT    Mobility  Bed Mobility Overal bed mobility: Needs Assistance Bed Mobility: Supine to Sit;Sit to Supine     Supine to sit: Min guard Sit to supine: Min guard   General bed mobility comments: cues for sequence and use of R LE to self assist  Transfers Overall transfer level: Needs assistance Equipment used: Rolling walker (2 wheeled) Transfers: Sit to/from Stand Sit to Stand: Min assist         General transfer comment: cues for LE management and use of UEs to self assist  Ambulation/Gait Ambulation/Gait assistance: Min assist;Min guard Ambulation Distance (Feet): 123 Feet Assistive device: Rolling walker (2 wheeled) Gait Pattern/deviations: Step-to pattern;Decreased step length - right;Decreased step length - left;Shuffle;Trunk flexed Gait velocity: decr   General Gait Details: cues for posture, position from RW, ER on L and initial sequence   Stairs            Wheelchair Mobility    Modified Rankin (Stroke Patients Only)       Balance                                    Cognition Arousal/Alertness: Awake/alert Behavior During Therapy: WFL for tasks assessed/performed Overall Cognitive Status: Within Functional Limits for tasks assessed                      Exercises      General Comments        Pertinent Vitals/Pain Pain Assessment: 0-10 Pain Score: 8  Pain Location: L hip/thigh Pain  Descriptors / Indicators: Aching;Sore Pain Intervention(s): Limited activity within patient's tolerance;Monitored during session;Premedicated before session;Ice applied    Home Living                      Prior Function            PT Goals (current goals can now be found in the care plan section) Acute Rehab PT Goals Patient Stated Goal: Resume previous lifestyle with decreased pain PT Goal Formulation: With patient Time For Goal Achievement: 01/01/15 Potential to Achieve Goals: Good Progress towards PT goals: Progressing toward goals    Frequency  7X/week    PT Plan Current plan remains appropriate    Co-evaluation             End of Session Equipment Utilized During Treatment: Gait belt Activity Tolerance: Patient tolerated treatment well Patient left: in bed;with call bell/phone within reach     Time: 5462-7035 PT Time Calculation (min) (ACUTE ONLY): 19 min  Charges:  $Gait Training: 8-22 mins                    G Codes:      Kristine Tiley 01/10/2015, 4:04 PM

## 2014-12-25 NOTE — Progress Notes (Signed)
OT Cancellation Note  Patient Details Name: Nicole [name] MRN: [id number] DOB: Mar 01, 1956   Cancelled Treatment:    Reason Eval/Treat Not Completed: Fatigue/lethargy limiting ability to participate Spoke with PT. Will recheck on pt later in day or next day Kari Baars, [place]  Payton Mccallum D 12/25/2014, 11:26 AM

## 2014-12-26 LAB — CBC
HEMATOCRIT: 33.3 % — AB (ref 36.0–46.0)
HEMOGLOBIN: 10.8 g/dL — AB (ref 12.0–15.0)
MCH: 30.3 pg (ref 26.0–34.0)
MCHC: 32.4 g/dL (ref 30.0–36.0)
MCV: 93.3 fL (ref 78.0–100.0)
Platelets: 275 10*3/uL (ref 150–400)
RBC: 3.57 MIL/uL — ABNORMAL LOW (ref 3.87–5.11)
RDW: 13.3 % (ref 11.5–15.5)
WBC: 17.5 10*3/uL — AB (ref 4.0–10.5)

## 2014-12-26 LAB — BASIC METABOLIC PANEL
Anion gap: 7 (ref 5–15)
BUN: 14 mg/dL (ref 6–23)
CALCIUM: 8.8 mg/dL (ref 8.4–10.5)
CHLORIDE: 108 mmol/L (ref 96–112)
CO2: 26 mmol/L (ref 19–32)
Creatinine, Ser: 0.66 mg/dL (ref 0.50–1.10)
GFR calc Af Amer: >90 mL/min (ref >90)
GFR calc non Af Amer: >90 mL/min (ref >90)
GLUCOSE: 125 mg/dL — AB (ref 70–99)
Potassium: 3.8 mmol/L (ref 3.5–5.1)
Sodium: 141 mmol/L (ref 135–145)

## 2014-12-26 MED ORDER — HYDROCODONE-ACETAMINOPHEN 7.5-325 MG PO TABS
1.0000 | ORAL_TABLET | ORAL | Status: DC | PRN
Start: 2014-12-26 — End: 2015-02-03

## 2014-12-26 MED ORDER — POLYETHYLENE GLYCOL 3350 17 G PO PACK: 17.0000 g | PACK | Freq: Two times a day (BID) | ORAL | Status: DC

## 2014-12-26 MED ORDER — ASPIRIN 325 MG PO TBEC: 325.0000 mg | DELAYED_RELEASE_TABLET | Freq: Two times a day (BID) | ORAL | Status: DC

## 2014-12-26 MED ORDER — DOCUSATE SODIUM 100 MG PO CAPS: 100.0000 mg | ORAL_CAPSULE | Freq: Two times a day (BID) | ORAL | Status: DC

## 2014-12-26 MED ORDER — FERROUS SULFATE 325 (65 FE) MG PO TABS: 325.0000 mg | ORAL_TABLET | Freq: Three times a day (TID) | ORAL | Status: DC

## 2014-12-26 MED ORDER — METHOCARBAMOL 500 MG PO TABS: 500.0000 mg | ORAL_TABLET | Freq: Four times a day (QID) | ORAL | Status: DC | PRN

## 2014-12-26 NOTE — Progress Notes (Signed)
RN reviewed discharge instructions with patient. All questions answered.   Paperwork and prescriptions given.   NT rolled patient down in wheelchair to family car.

## 2014-12-26 NOTE — Progress Notes (Signed)
Physical Therapy Treatment Patient Details Name: Nicole [name] MRN: [id number] DOB: Oct 28, 1956 Today's Date: 12/26/2014    History of Present Illness L THR    PT Comments    POD # 2 am session pt eager to D/C to home.  Amb in hallway, performed stairs then returned to room to perform THR TE's following handout HEP.  Pt instructed on proper tech and freq as well as use of ICE after.  Pt instructed on proper positioning of pillows during sidelying.  Pt instructed on proper tech to get in/out car.    Follow Up Recommendations  Home health PT     Equipment Recommendations  None recommended by PT    Recommendations for Other Services       Precautions / Restrictions Precautions Precautions: Fall Restrictions Weight Bearing Restrictions: No Other Position/Activity Restrictions: WBAT    Mobility  Bed Mobility Overal bed mobility: Modified Independent             General bed mobility comments: extra time; used UEs to assist LLE  Transfers Overall transfer level: Modified independent Equipment used: Rolling walker (2 wheeled) Transfers: Sit to/from Stand           General transfer comment: good safety cognition  Ambulation/Gait Ambulation/Gait assistance: Modified independent (Device/Increase time);Supervision Ambulation Distance (Feet): 185 Feet Assistive device: Rolling walker (2 wheeled) Gait Pattern/deviations: Step-through pattern;Decreased stride length Gait velocity: decreased but functional   General Gait Details: one VC safety with turns   Stairs Stairs: Yes Stairs assistance: Supervision Stair Management: No rails;Step to pattern;Forwards;With cane Number of Stairs: 2 General stair comments: performed twice.  First time up backward with RW.  Second time up foward with cane.  Pt much preferred using the cane and performed safely.    Wheelchair Mobility    Modified Rankin (Stroke Patients Only)       Balance                                     Cognition Arousal/Alertness: Awake/alert Behavior During Therapy: WFL for tasks assessed/performed Overall Cognitive Status: Within Functional Limits for tasks assessed                      Exercises   Total Hip Replacement TE's 10 reps ankle pumps 10 reps knee presses 10 reps heel slides 10 reps SAQ's 10 reps ABD Followed by ICE    General Comments        Pertinent Vitals/Pain Pain Assessment: 0-10 Pain Score: 3  Pain Location: l hip Pain Descriptors / Indicators: Sore Pain Intervention(s): Monitored during session;Premedicated before session;Repositioned;Ice applied    Home Living                      Prior Function            PT Goals (current goals can now be found in the care plan section) Progress towards PT goals: Progressing toward goals    Frequency  7X/week    PT Plan      Co-evaluation             End of Session Equipment Utilized During Treatment: Gait belt Activity Tolerance: Patient tolerated treatment well       Time: 0945-1010 PT Time Calculation (min) (ACUTE ONLY): 25 min  Charges:  $Gait Training: 8-22 mins $Therapeutic Exercise: 8-22 mins  G Codes:      Rica Koyanagi  PTA [facility]  Acute  Rehab Pager      [phone]

## 2014-12-26 NOTE — Evaluation (Signed)
Occupational Therapy Evaluation Patient Details Name: Nicole [name] MRN: [id number] DOB: Oct 19, 1956 Today's Date: 12/26/2014    History of Present Illness L THR   Clinical Impression   This 59 year old female was admitted for the above surgery.  All education was completed.  No further OT is needed at this time.      Follow Up Recommendations  No OT follow up    Equipment Recommendations  None recommended by OT (pt will get an elevated toilet seat if she needs it.  She feels she will be OK with standard commode)    Recommendations for Other Services       Precautions / Restrictions Precautions Precautions: Fall Restrictions Weight Bearing Restrictions: No Other Position/Activity Restrictions: WBAT      Mobility Bed Mobility         Supine to sit: Modified independent (Device/Increase time) Sit to supine: Modified independent (Device/Increase time)   General bed mobility comments: extra time; used UEs to assist LLE  Transfers   Equipment used: Rolling walker (2 wheeled) Transfers: Sit to/from Stand Sit to Stand: Supervision         General transfer comment: cues for LE placement    Balance                                            ADL Overall ADL's : Needs assistance/impaired             Lower Body Bathing: Supervison/ safety;With adaptive equipment;Sit to/from stand       Lower Body Dressing: Supervision/safety;Sit to/from stand;With adaptive equipment   Toilet Transfer: Supervision/safety;Ambulation;Comfort height toilet   Toileting- Clothing Manipulation and Hygiene: Supervision/safety;Sit to/from stand   Tub/ Shower Transfer: Supervision/safety;Walk-in shower;Ambulation     General ADL Comments: pt is able to perform UB adls with set up.  She has a long sponge and can use Dad's reacher.  Educated on sock aide, which she tried and long shoehorn. She is wearing slip on shoes with a back right now.  Pt will get  herself an elevated toilet seat, if her toilet is difficult:  she feels that she can manage.  Educated on using cabinet and walker.  Pt tends to move quickly     Vision     Perception     Praxis      Pertinent Vitals/Pain Pain Score: 5  Pain Location: L hip/thigh Pain Descriptors / Indicators: Sore Pain Intervention(s): Limited activity within patient's tolerance;Monitored during session;Premedicated before session;Repositioned;Ice applied     Hand Dominance     Extremity/Trunk Assessment Upper Extremity Assessment Upper Extremity Assessment: Overall WFL for tasks assessed           Communication Communication Communication: No difficulties   Cognition Arousal/Alertness: Awake/alert Behavior During Therapy: WFL for tasks assessed/performed Overall Cognitive Status: Within Functional Limits for tasks assessed                     General Comments       Exercises       Shoulder Instructions      Home Living Family/patient expects to be discharged to:: Private residence Living Arrangements: Parent Available Help at Discharge: Family               Bathroom Shower/Tub: Walk-in Psychologist, prison and probation services: Standard     Home Equipment: Civil engineer, contracting  Prior Functioning/Environment Level of Independence: Independent;Independent with assistive device(s)             OT Diagnosis: Generalized weakness;Acute pain   OT Problem List:     OT Treatment/Interventions:      OT Goals(Current goals can be found in the care plan section) Acute Rehab OT Goals Patient Stated Goal: heal and get back to work  OT Frequency:     Barriers to D/C:            Co-evaluation              End of Session    Activity Tolerance: Patient tolerated treatment well Patient left: in bed;with call bell/phone within reach   Time: 0810-0831 OT Time Calculation (min): 21 min Charges:  OT General Charges $OT Visit: 1 Procedure OT Evaluation $Initial  OT Evaluation Tier I: 1 Procedure G-Codes:    Angelicia Lessner 01-22-15, 8:44 AM Lesle Chris, OTR/L [phone] 22-Jan-2015

## 2014-12-26 NOTE — Progress Notes (Signed)
     Subjective: 2 Days Post-Op Procedure(s) (LRB): LEFT TOTAL HIP ARTHROPLASTY ANTERIOR APPROACH (Left)   Patient reports pain as mild, pain controlled. No events throughout the night. Working well with PT.  Ready to be discharged home.  Objective:   VITALS:   Filed Vitals:   12/26/14 0633  BP: 140/83  Pulse: 90  Temp: 97.8 F (36.6 C)  Resp: 18    Dorsiflexion/Plantar flexion intact Incision: dressing C/D/I No cellulitis present Compartment soft  LABS  Recent Labs  12/25/14 0433 12/26/14 0530  HGB 11.4* 10.8*  HCT 35.0* 33.3*  WBC 9.7 17.5*  PLT 256 275     Recent Labs  12/25/14 0433 12/26/14 0530  NA 139 141  K 4.1 3.8  BUN 11 14  CREATININE 0.74 0.66  GLUCOSE 160* 125*     Assessment/Plan: 2 Days Post-Op Procedure(s) (LRB): LEFT TOTAL HIP ARTHROPLASTY ANTERIOR APPROACH (Left) Up with therapy Discharge home with home health  Follow up in 2 weeks at [facility]. Follow up with OLIN,Jesselee Poth D in 2 weeks.  Contact information:  [facility] [address] [place] Emad Brechtel   PAC  12/26/2014, 7:13 AM

## 2014-12-29 NOTE — Discharge Summary (Signed)
Physician Discharge Summary  Patient ID: BRENTLEY HORRELL MRN: [id number] DOB/AGE: Jan 21, 1956 59 y.o.  Admit date: 12/24/2014 Discharge date: 12/26/2014   Procedures:  Procedure(s) (LRB): LEFT TOTAL HIP ARTHROPLASTY ANTERIOR APPROACH (Left)  Attending Physician:  Dr. Paralee Cancel   Admission Diagnoses:   Left hip primary OA / pain  Discharge Diagnoses:  Principal Problem:   S/P left THA, AA  Past Medical History  Diagnosis Date  . Complication of anesthesia   . PONV (postoperative nausea and vomiting)   . Family history of anesthesia complication     N&V- Mother   . Heart murmur     since birth  . Hypertension     followed by Dr. Marisue Humble   . H/O hiatal hernia     small hiatal hernia, has had endoscopy & colondoscopy  . GERD (gastroesophageal reflux disease)     Chron's disease, not medically treating currently   . Arthritis     lumbar- HNP, /w myelopathy  . Bronchitis     used albuterol , Sept. 2013, all clear now   . Asthma     /w reflux , consult /w Dr. Wert-2013 / USES INHALER PRN INFREQUENTLY  . Numbness in left leg   . Stress incontinence   . Difficulty sleeping     DUE TO PAIN  . Anxiety   . H/O vertigo     HPI:    ALANNA STORTI, 59 y.o. female, has a history of pain and functional disability in the left hip(s) due to arthritis and patient has failed non-surgical conservative treatments for greater than 12 weeks to include NSAID's and/or analgesics, use of assistive devices and activity modification. Onset of symptoms was gradual starting <1 year ago with rapidlly worsening course since that time.The patient noted no past surgery on the left hip(s). Patient currently rates pain in the left hip at 10 out of 10 with activity. Patient has night pain, worsening of pain with activity and weight bearing, trendelenberg gait, pain that interfers with activities of daily living and pain with passive range of motion. Patient has evidence of periarticular osteophytes  and joint space narrowing by imaging studies. This condition presents safety issues increasing the risk of falls. There is no current active infection. Risks, benefits and expectations were discussed with the patient. Risks including but not limited to the risk of anesthesia, blood clots, nerve damage, blood vessel damage, failure of the prosthesis, infection and up to and including death. Patient understand the risks, benefits and expectations and wishes to proceed with surgery.  PCP: Simona Huh, MD   Discharged Condition: good  Hospital Course:  Patient underwent the above stated procedure on 12/24/2014. Patient tolerated the procedure well and brought to the recovery room in good condition and subsequently to the floor.  POD #1 BP: 116/61 ; Pulse: 84 ; Temp: 98.2 F (36.8 C) ; Resp: 16 Patient reports pain as moderate. Sore, not much sleep last night. Dorsiflexion/plantar flexion intact, incision: dressing C/D/I, no cellulitis present and compartment soft.   LABS  Basename    HGB  11.4  HCT  35.0   POD #2  BP: 140/83 ; Pulse: 90 ; Temp: 97.8 F (36.6 C) ; Resp: 18 Patient reports pain as mild, pain controlled. No events throughout the night. Working well with PT. Ready to be discharged home. Dorsiflexion/plantar flexion intact, incision: dressing C/D/I, no cellulitis present and compartment soft.   LABS  Basename    HGB  10.8  HCT  33.3  Discharge Exam: General appearance: alert, cooperative and no distress Extremities: Homans sign is negative, no sign of DVT, no edema, redness or tenderness in the calves or thighs and no ulcers, gangrene or trophic changes  Disposition: Home with follow up in 2 weeks   Follow-up Information    Follow up with [facility].   Why:  home health physical therapy   Contact information:   [address] [place] [address] [phone]       Follow up with Mauri Pole, MD. Schedule an appointment as soon  as possible for a visit in 2 weeks.   Specialty:  Orthopedic Surgery   Contact information:   [address] [place] [address] [phone]       Discharge Instructions    Call MD / Call 911    Complete by:  As directed   If you experience chest pain or shortness of breath, CALL 911 and be transported to the hospital emergency room.  If you develope a fever above 101 F, pus (white drainage) or increased drainage or redness at the wound, or calf pain, call your surgeon's office.     Change dressing    Complete by:  As directed   Maintain surgical dressing until follow up in the clinic. If the edges start to pull up, may reinforce with tape. If the dressing is no longer working, may remove and cover with gauze and tape, but must keep the area dry and clean.  Call with any questions or concerns.     Constipation Prevention    Complete by:  As directed   Drink plenty of fluids.  Prune juice may be helpful.  You may use a stool softener, such as Colace (over the counter) 100 mg twice a day.  Use MiraLax (over the counter) for constipation as needed.     Diet - low sodium heart healthy    Complete by:  As directed      Discharge instructions    Complete by:  As directed   Maintain surgical dressing until follow up in the clinic. If the edges start to pull up, may reinforce with tape. If the dressing is no longer working, may remove and cover with gauze and tape, but must keep the area dry and clean.  Follow up in 2 weeks at [facility]. Call with any questions or concerns.     Increase activity slowly as tolerated    Complete by:  As directed      TED hose    Complete by:  As directed   Use stockings (TED hose) for 2 weeks on both leg(s).  You may remove them at night for sleeping.     Weight bearing as tolerated    Complete by:  As directed   Laterality:  left  Extremity:  Lower             Medication List    STOP taking these medications         acetaminophen 500 MG tablet  Commonly known as:  TYLENOL     aspirin 81 MG tablet  Replaced by:  aspirin 325 MG EC tablet     traMADol 50 MG tablet  Commonly known as:  ULTRAM      TAKE these medications        ALPRAZolam 0.25 MG tablet  Commonly known as:  XANAX  Take 0.25 mg by mouth at bedtime as needed for sleep.  amLODipine 2.5 MG tablet  Commonly known as:  NORVASC  Take 2.5 mg by mouth daily.     aspirin 325 MG EC tablet  Take 1 tablet (325 mg total) by mouth 2 (two) times daily.     docusate sodium 100 MG capsule  Commonly known as:  COLACE  Take 1 capsule (100 mg total) by mouth 2 (two) times daily.     esomeprazole 40 MG capsule  Commonly known as:  NEXIUM  Take 40 mg by mouth 2 (two) times daily. Pt can not take generic     estradiol 0.075 mg/24hr patch  Commonly known as:  CLIMARA - Dosed in mg/24 hr  Place 0.075 mg onto the skin 2 (two) times a week. Pt does not change on any particular day     ferrous sulfate 325 (65 FE) MG tablet  Take 1 tablet (325 mg total) by mouth 3 (three) times daily after meals.     FISH OIL PO  Take 1 tablet by mouth daily.     HYDROcodone-acetaminophen 7.5-325 MG per tablet  Commonly known as:  NORCO  Take 1-2 tablets by mouth every 4 (four) hours as needed for moderate pain.     losartan 100 MG tablet  Commonly known as:  COZAAR  Take 100 mg by mouth daily.     MAGNESIUM PO  Take 1 tablet by mouth daily.     methocarbamol 500 MG tablet  Commonly known as:  ROBAXIN  Take 1 tablet (500 mg total) by mouth every 6 (six) hours as needed for muscle spasms.     mirtazapine 45 MG tablet  Commonly known as:  REMERON  Take 45 mg by mouth at bedtime.     polyethylene glycol packet  Commonly known as:  MIRALAX / GLYCOLAX  Take 17 g by mouth 2 (two) times daily.     VITAMIN B-12 PO  Take 1 tablet by mouth daily.     VITAMIN D PO  Take 1 tablet by mouth daily.         Signed: West Pugh. Demitrius Crass    PA-C  12/29/2014, 12:39 PM

## 2015-02-02 ENCOUNTER — Encounter: Payer: Self-pay | Admitting: *Deleted

## 2015-02-03 ENCOUNTER — Ambulatory Visit (INDEPENDENT_AMBULATORY_CARE_PROVIDER_SITE_OTHER): Payer: 59 | Admitting: Interventional Cardiology

## 2015-02-03 ENCOUNTER — Encounter: Payer: Self-pay | Admitting: Interventional Cardiology

## 2015-02-03 VITALS — BP 102/82 | HR 91 | Ht 66.0 in | Wt 197.8 lb

## 2015-02-03 DIAGNOSIS — R42 Dizziness and giddiness: Secondary | ICD-10-CM

## 2015-02-03 DIAGNOSIS — I10 Essential (primary) hypertension: Secondary | ICD-10-CM

## 2015-02-03 MED ORDER — DILTIAZEM HCL ER COATED BEADS 180 MG PO CP24: 180.0000 mg | ORAL_CAPSULE | Freq: Every day | ORAL | Status: DC

## 2015-02-03 NOTE — Progress Notes (Signed)
Cardiology Office Note   Date:  02/03/2015   ID:  Nicole [name], DOB Jan 22, 1956, MRN [id number]  PCP:  Simona Huh, MD  Cardiologist:   Sinclair Grooms, MD   Chief Complaint  Patient presents with  . Hypertension      History of Present Illness: Nicole [name] is a 59 y.o. female who presents for  Referred by Dr. Lavella Hammock for hypertension. She is asymptomatic but does not precipitate events. She  Had a recent total hip. She does not salt in her diet. There is a long-standing history of hypertension.    Past Medical History  Diagnosis Date  . Complication of anesthesia   . PONV (postoperative nausea and vomiting)   . Family history of anesthesia complication     N&V- Mother   . Heart murmur     since birth  . Hypertension     followed by Dr. Marisue Humble   . H/O hiatal hernia     small hiatal hernia, has had endoscopy & colondoscopy  . GERD (gastroesophageal reflux disease)     Chron's disease, not medically treating currently   . Arthritis     lumbar- HNP, /w myelopathy  . Bronchitis     used albuterol , Sept. 2013, all clear now   . Asthma     /w reflux , consult /w Dr. Wert-2013 / USES INHALER PRN INFREQUENTLY  . Numbness in left leg   . Stress incontinence   . Difficulty sleeping     DUE TO PAIN  . Anxiety   . H/O vertigo     Past Surgical History  Procedure Laterality Date  . Cholecystectomy  03    laparoscopic  . Lumbar laminectomy/decompression microdiscectomy  09/10/2012    Procedure: LUMBAR LAMINECTOMY/DECOMPRESSION MICRODISCECTOMY 1 LEVEL;  Surgeon: Kristeen Miss, MD;  Location: [facility];  Service: Neurosurgery;  Laterality: Left;  Left Lumbar Five-Sacral One Microdiscectomy  . Back surgery  2013  . Total hip arthroplasty Left 12/24/2014    Procedure: LEFT TOTAL HIP ARTHROPLASTY ANTERIOR APPROACH;  Surgeon: Mauri Pole, MD;  Location: [facility];  Service: Orthopedics;  Laterality: Left;     Current Outpatient Prescriptions  Medication  Sig Dispense Refill  . ALPRAZolam (XANAX) 0.25 MG tablet Take 0.25 mg by mouth at bedtime as needed for sleep.    Marland Kitchen amLODipine (NORVASC) 2.5 MG tablet Take 2.5 mg by mouth daily.    Marland Kitchen aspirin EC 325 MG EC tablet Take 1 tablet (325 mg total) by mouth 2 (two) times daily. 60 tablet 0  . Cholecalciferol (VITAMIN D3) 1000 UNITS CAPS Take 1 capsule by mouth daily.    Marland Kitchen esomeprazole (NEXIUM) 40 MG capsule Take 40 mg by mouth 2 (two) times daily. Pt can not take generic    . estradiol (CLIMARA - DOSED IN MG/24 HR) 0.075 mg/24hr patch Place 0.075 mg onto the skin 2 (two) times a week. Pt does not change on any particular day    . Investigational omega-3-fatty acid/placebo capsule S0927 Take 3 capsules by mouth 2 (two) times daily. Take with food.    Marland Kitchen losartan (COZAAR) 100 MG tablet Take 100 mg by mouth daily.     Marland Kitchen MAGNESIUM PO Take 1 tablet by mouth daily.    . methocarbamol (ROBAXIN) 500 MG tablet Take 1 tablet (500 mg total) by mouth every 6 (six) hours as needed for muscle spasms. 30 tablet 0  . mirtazapine (REMERON) 45 MG tablet Take 45 mg by mouth  at bedtime.    . Omega-3 Fatty Acids (FISH OIL) 1000 MG CAPS Take 1 capsule by mouth daily.    . polyethylene glycol (MIRALAX / GLYCOLAX) packet Take 17 g by mouth 2 (two) times daily. 14 each 0  . vitamin B-12 (CYANOCOBALAMIN) 500 MCG tablet Take 500 mcg by mouth daily.     No current facility-administered medications for this visit.    Allergies:   Hctz; Metoclopramide hcl; Voltaren; Avelox; and Neosporin    Social History:  The patient  reports that she quit smoking about 15 years ago. Her smoking use included Cigarettes. She has a 40.5 pack-year smoking history. She has never used smokeless tobacco. She reports that she drinks alcohol. She reports that she does not use illicit drugs.   Family History:  The patient's family history includes Asthma in her mother; Diabetes in her father; Glaucoma in her mother; Hypertension in her father and sister;  Kidney disease in her father and mother; Stroke in her father; Thyroid disease in her mother.    ROS:  Please see the history of present illness.   Otherwise, review of systems are positive for  Depression with a surgery. Left hip surgery. History of chronic Bactrim. History of anxiety..   All other systems are reviewed and negative.    PHYSICAL EXAM: VS:  BP 102/82 mmHg  Pulse 91  Ht 5\' 6"  (1.676 m)  Wt 197 lb 12.8 oz (89.721 kg)  BMI 31.94 kg/m2  SpO2 97% , BMI Body mass index is 31.94 kg/(m^2). GEN: Well nourished, well developed, in no acute distress HEENT: normal Neck: no JVD, carotid bruits, or masses Cardiac: RRR; no murmurs, rubs, or gallops,no edema  Respiratory:  clear to auscultation bilaterally, normal work of breathing GI: soft, nontender, nondistended, + BS MS: no deformity or atrophy Skin: warm and dry, no rash Neuro:  Strength and sensation are intact Psych: euthymic mood, full affect   EKG:  EKG is not ordered today.    Recent Labs: 12/26/2014: BUN 14; Creatinine 0.66; Hemoglobin 10.8*; Platelets 275; Potassium 3.8; Sodium 141    Lipid Panel No results found for: CHOL, TRIG, HDL, CHOLHDL, VLDL, LDLCALC, LDLDIRECT    Wt Readings from Last 3 Encounters:  02/03/15 197 lb 12.8 oz (89.721 kg)  12/24/14 185 lb (83.915 kg)  09/23/14 188 lb (85.276 kg)      Other studies Reviewed: Additional studies/ records that were reviewed today include:  none.    ASSESSMENT AND PLAN:  Essential hypertension:  Previously on hydrochlorothiazide for very short time frame but discontinued because of cramping. The simplest thing at this time will be to switch from amlodipine to diltiazem CD 180 mg daily and titrate.  Dizziness and giddiness : Recurrent problem     Current medicines are reviewed at length with the patient today.  The patient has concerns regarding medicines.  The following changes have been made:   Diltiazem CD 180 mg daily. Discontinue amlodipine.  We'll continue to titrate the diltiazem. I wanted to use a beta blocker but she currently is feeling depressed related to  recent surgery. We will see her back in a month for the changes as needed.  Low-salt diet recommended. When she is over the depression related to surgery, beta blocker therapy tablet losartan may be a better option.  Labs/ tests ordered today include:  No orders of the defined types were placed in this encounter.     Disposition:   FU with  Linard Millers in 1 month  Signed, Sinclair Grooms, MD  02/03/2015 8:55 AM    [facility] [place], [place], [place]  [address] Phone: [phone]; Fax: [phone]

## 2015-02-03 NOTE — Patient Instructions (Addendum)
Your physician has recommended you make the following change in your medication:  1) STOP Amlodipine 2) START Diltiazem 180mg  daily. An Rx has been sent to your pharmacy  Monitor your sodium intake  Your physician recommends that you schedule a follow-up appointment in: 1 month with Dr.Smith/or a NP/PA

## 2015-03-17 ENCOUNTER — Ambulatory Visit (INDEPENDENT_AMBULATORY_CARE_PROVIDER_SITE_OTHER): Payer: 59 | Admitting: Interventional Cardiology

## 2015-03-17 ENCOUNTER — Encounter: Payer: Self-pay | Admitting: Interventional Cardiology

## 2015-03-17 VITALS — BP 118/62 | HR 84 | Ht 66.0 in | Wt 199.4 lb

## 2015-03-17 DIAGNOSIS — I10 Essential (primary) hypertension: Secondary | ICD-10-CM

## 2015-03-17 MED ORDER — DILTIAZEM HCL ER COATED BEADS 180 MG PO CP24: 180.0000 mg | ORAL_CAPSULE | Freq: Every day | ORAL | Status: DC

## 2015-03-17 NOTE — Progress Notes (Signed)
Cardiology Office Note   Date:  03/17/2015   ID:  Nicole [name], DOB 20-Sep-1956, MRN [id number]  PCP:  Simona Huh, MD  Cardiologist:   Sinclair Grooms, MD   Chief Complaint  Patient presents with  . Hypertension      History of Present Illness: Nicole [name] is a 59 y.o. female who presents for hypertension.  Patient has no cardiovascular complaints. 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 The addition of diltiazem has not caused side effects. She has not been back to follow-up with Dr. Marisue Humble.    Past Medical History  Diagnosis Date  . Complication of anesthesia   . PONV (postoperative nausea and vomiting)   . Family history of anesthesia complication     N&V- Mother   . Heart murmur     since birth  . Hypertension     followed by Dr. Marisue Humble   . H/O hiatal hernia     small hiatal hernia, has had endoscopy & colondoscopy  . GERD (gastroesophageal reflux disease)     Chron's disease, not medically treating currently   . Arthritis     lumbar- HNP, /w myelopathy  . Bronchitis     used albuterol , Sept. 2013, all clear now   . Asthma     /w reflux , consult /w Dr. Wert-2013 / USES INHALER PRN INFREQUENTLY  . Numbness in left leg   . Stress incontinence   . Difficulty sleeping     DUE TO PAIN  . Anxiety   . H/O vertigo     Past Surgical History  Procedure Laterality Date  . Cholecystectomy  03    laparoscopic  . Lumbar laminectomy/decompression microdiscectomy  09/10/2012    Procedure: LUMBAR LAMINECTOMY/DECOMPRESSION MICRODISCECTOMY 1 LEVEL;  Surgeon: Kristeen Miss, MD;  Location: [facility];  Service: Neurosurgery;  Laterality: Left;  Left Lumbar Five-Sacral One Microdiscectomy  . Back surgery  2013  . Total hip arthroplasty Left 12/24/2014    Procedure: LEFT TOTAL HIP ARTHROPLASTY ANTERIOR APPROACH;  Surgeon: Mauri Pole, MD;  Location: [facility];  Service: Orthopedics;  Laterality: Left;      Current Outpatient Prescriptions  Medication Sig Dispense Refill  . ALPRAZolam (XANAX) 0.25 MG tablet Take 0.25 mg by mouth at bedtime as needed for sleep.    Marland Kitchen aspirin EC 325 MG EC tablet Take 1 tablet (325 mg total) by mouth 2 (two) times daily. 60 tablet 0  . Cholecalciferol (VITAMIN D3) 1000 UNITS CAPS Take 1 capsule by mouth daily.    Marland Kitchen diltiazem (CARDIZEM CD) 180 MG 24 hr capsule Take 1 capsule (180 mg total) by mouth daily. 30 capsule 11  . esomeprazole (NEXIUM) 40 MG capsule Take 40 mg by mouth 2 (two) times daily. Pt can not take generic    . estradiol (CLIMARA - DOSED IN MG/24 HR) 0.075 mg/24hr patch Place 0.075 mg onto the skin 2 (two) times a week. Pt does not change on any particular day    . losartan (COZAAR) 100 MG tablet Take 100 mg by mouth daily.     Marland Kitchen MAGNESIUM PO Take 1 tablet by mouth daily.    . polyethylene glycol (MIRALAX / GLYCOLAX) packet Take 17 g by mouth 2 (two) times daily. (Patient taking differently: Take 17 g by mouth daily as needed for mild constipation. ) 14 each 0  . vitamin B-12 (CYANOCOBALAMIN) 500 MCG tablet Take 500 mcg by mouth  daily.     No current facility-administered medications for this visit.    Allergies:   Hctz; Metoclopramide hcl; Voltaren; Avelox; Flagyl; and Neosporin    Social History:  The patient  reports that she quit smoking about 15 years ago. Her smoking use included Cigarettes. She has a 40.5 pack-year smoking history. She has never used smokeless tobacco. She reports that she drinks alcohol. She reports that she does not use illicit drugs.   Family History:  The patient's family history includes Asthma in her mother; Diabetes in her father; Glaucoma in her mother; Hypertension in her father and sister; Kidney disease in her father and mother; Stroke in her father; Thyroid disease in her mother.    ROS:  Please see the history of present illness.   Otherwise, review of systems are positive for none.   All other systems are  reviewed and negative.    PHYSICAL EXAM: VS:  BP 118/62 mmHg  Pulse 84  Ht 5\' 6"  (1.676 m)  Wt 199 lb 6.4 oz (90.447 kg)  BMI 32.20 kg/m2 , BMI Body mass index is 32.2 kg/(m^2). GEN: Well nourished, well developed, in no acute distress HEENT: normal Neck: no JVD, carotid bruits, or masses Cardiac: RRR; no murmurs, rubs, or gallops,no edema  Respiratory:  clear to auscultation bilaterally, normal work of breathing GI: soft, nontender, nondistended, + BS MS: no deformity or atrophy Skin: warm and dry, no rash Neuro:  Strength and sensation are intact Psych: euthymic mood, full affect   EKG:  EKG is not ordered today.   Recent Labs: 12/26/2014: BUN 14; Creatinine 0.66; Hemoglobin 10.8*; Platelets 275; Potassium 3.8; Sodium 141    Lipid Panel No results found for: CHOL, TRIG, HDL, CHOLHDL, VLDL, LDLCALC, LDLDIRECT    Wt Readings from Last 3 Encounters:  03/17/15 199 lb 6.4 oz (90.447 kg)  02/03/15 197 lb 12.8 oz (89.721 kg)  12/24/14 185 lb (83.915 kg)      Other studies Reviewed: Additional studies/ records that were reviewed today include: .   ASSESSMENT AND PLAN:  Essential hypertension: Controlled on the current medical regimen without side effects  Systolic murmur but without echocardiographic evidence of structural abnormality.   Current medicines are reviewed at length with the patient today.  The patient does not have concerns regarding medicines.  The following changes have been made:  no change  Labs/ tests ordered today include:  No orders of the defined types were placed in this encounter.     Disposition:   FU with HS in 1 year  Signed, Sinclair Grooms, MD  03/17/2015 10:09 AM    [facility], [place], [place]  [address] Phone: [phone]; Fax: [phone]

## 2015-03-17 NOTE — Patient Instructions (Signed)

## 2015-06-01 ENCOUNTER — Other Ambulatory Visit: Payer: Self-pay | Admitting: Obstetrics and Gynecology

## 2015-06-03 LAB — CYTOLOGY - PAP

## 2015-06-08 ENCOUNTER — Other Ambulatory Visit: Payer: Self-pay | Admitting: Family Medicine

## 2015-06-08 ENCOUNTER — Ambulatory Visit
Admission: RE | Admit: 2015-06-08 | Discharge: 2015-06-08 | Disposition: A | Discharge status: Home / Self Care | Payer: 59 | Source: Ambulatory Visit | Attending: Family Medicine | Admitting: Family Medicine

## 2015-06-08 DIAGNOSIS — R05 Cough: Secondary | ICD-10-CM

## 2015-06-08 DIAGNOSIS — Z87891 Personal history of nicotine dependence: Secondary | ICD-10-CM

## 2015-06-08 DIAGNOSIS — R059 Cough, unspecified: Secondary | ICD-10-CM

## 2015-06-08 DIAGNOSIS — R062 Wheezing: Secondary | ICD-10-CM

## 2015-06-09 ENCOUNTER — Other Ambulatory Visit (HOSPITAL_COMMUNITY): Payer: Self-pay | Admitting: Respiratory Therapy

## 2015-06-09 DIAGNOSIS — R05 Cough: Secondary | ICD-10-CM

## 2015-06-09 DIAGNOSIS — R059 Cough, unspecified: Secondary | ICD-10-CM

## 2015-06-10 ENCOUNTER — Encounter: Payer: Self-pay | Admitting: Physical Therapy

## 2015-06-10 ENCOUNTER — Ambulatory Visit: Payer: PRIVATE HEALTH INSURANCE | Attending: Neurological Surgery | Admitting: Physical Therapy

## 2015-06-10 DIAGNOSIS — M5417 Radiculopathy, lumbosacral region: Secondary | ICD-10-CM | POA: Insufficient documentation

## 2015-06-10 DIAGNOSIS — M5440 Lumbago with sciatica, unspecified side: Secondary | ICD-10-CM | POA: Diagnosis not present

## 2015-06-10 DIAGNOSIS — R208 Other disturbances of skin sensation: Secondary | ICD-10-CM | POA: Insufficient documentation

## 2015-06-10 DIAGNOSIS — M5416 Radiculopathy, lumbar region: Secondary | ICD-10-CM

## 2015-06-10 DIAGNOSIS — R269 Unspecified abnormalities of gait and mobility: Secondary | ICD-10-CM | POA: Diagnosis present

## 2015-06-10 DIAGNOSIS — R29898 Other symptoms and signs involving the musculoskeletal system: Secondary | ICD-10-CM | POA: Diagnosis present

## 2015-06-10 DIAGNOSIS — R209 Unspecified disturbances of skin sensation: Secondary | ICD-10-CM

## 2015-06-10 NOTE — Therapy (Addendum)
[place], [place], [address] Phone: [phone]   Fax:  [phone]  Physical Therapy Evaluation  Patient Details  Name: Nicole [name] MRN: [id number] Date of Birth: 06-23-1956 Referring Provider:  Kristeen Miss, [name]  Encounter Date: 06/10/2015      PT End of Session - 06/10/15 1136    Visit Number 1   Number of Visits 12   Date for PT Re-Evaluation 07/22/15   PT Start Time 1020   PT Stop Time 1120   PT Time Calculation (min) 60 min   Activity Tolerance Patient tolerated treatment well;Other (comment)  pt stated her toes on l begin to involuntary curl during estim/heat following eval   Behavior During Therapy [name] for tasks assessed/performed      Past Medical History  Diagnosis Date  . Complication of anesthesia   . PONV (postoperative nausea and vomiting)   . Family history of anesthesia complication     N&V- Mother   . Heart murmur     since birth  . Hypertension     followed by Dr. Marisue Humble   . H/O hiatal hernia     small hiatal hernia, has had endoscopy & colondoscopy  . GERD (gastroesophageal reflux disease)     Chron's disease, not medically treating currently   . Arthritis     lumbar- HNP, /w myelopathy  . Bronchitis     used albuterol , Sept. 2013, all clear now   . Asthma     /w reflux , consult /w Dr. Wert-2013 / USES INHALER PRN INFREQUENTLY  . Numbness in left leg   . Stress incontinence   . Difficulty sleeping     DUE TO PAIN  . Anxiety   . H/O vertigo     Past Surgical History  Procedure Laterality Date  . Cholecystectomy  03    laparoscopic  . Lumbar laminectomy/decompression microdiscectomy  09/10/2012    Procedure: LUMBAR LAMINECTOMY/DECOMPRESSION MICRODISCECTOMY 1 LEVEL;  Surgeon: Nicole [name];  Location: [facility];  Service: Neurosurgery;  Laterality: Left;  Left Lumbar Five-Sacral One Microdiscectomy  . Back surgery  2013  . Total hip arthroplasty Left  12/24/2014    Procedure: LEFT TOTAL HIP ARTHROPLASTY ANTERIOR APPROACH;  Surgeon: Nicole [name];  Location: [facility];  Service: Orthopedics;  Laterality: Left;    There were no vitals filed for this visit.  Visit Diagnosis:  Midline low back pain with sciatica, sciatica laterality unspecified  Lumbar back pain with radiculopathy affecting left lower extremity  Abnormality of gait  Weakness of left hip  Sensory disturbance      Subjective Assessment - 06/10/15 1028    Subjective Patient with new referral for chronic LBP and L hip weakness.  Her inital injury in 2011 was due to pulling racks needed for her job. Her symptoms, sensory in LLE (lower leg and foot) with involuntary contractions.  She has weakness of LLE, including ankle DF and walks with a limp.    [name] is currently working with a Physiological scientist doing weight lifting exercises.   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    Pertinent History L THR (ant approach) 12/24/14, lumbar microsdiscectomy 08/2012   Limitations Sitting;Walking   How long can you sit comfortably? 15 min pt. states her foot will turn blue and cold when sitting longer than this amount of time, constant tingle/numbness in L foot and lateral leg   How long can you  stand comfortably? standing is better for her but when she starts walking the symptoms occur   Diagnostic tests none recently   Patient Stated Goals get my leg stronger, decrease likelihood of foot drop   Currently in Pain? Yes   Pain Score 4   Pt states she works 3 to 11pm and her pain substantially increases by 11pm   Pain Location Back   Pain Orientation Left   Pain Descriptors / Indicators Burning;Constant;Tingling;Tightness;Numbness   Pain Radiating Towards sciatic pain down the leg after standing in Operating room for long periods   Pain Onset More than a month ago   Pain Frequency Constant   Aggravating Factors  sitting long periods, walking, work activites,  pushing and pulling   Pain Relieving Factors heating pad and resting   Effect of Pain on Daily Activities Unable to drive for more than an hour without taking breaks            Nicole [name] PT Assessment - 06/10/15 1041    Assessment   Prior Therapy yes   Balance Screen   Has the patient fallen in the past 6 months No   Has the patient had a decrease in activity level because of a fear of falling?  No   Is the patient reluctant to leave their home because of a fear of falling?  No   Prior Function   Level of Independence Independent   Cognition   Overall Cognitive Status Within Functional Limits for tasks assessed   Sensation   Light Touch Impaired by gross assessment   Additional Comments numbness and tinlling, loss of sensation in left foot   AROM   Lumbar Flexion 70 no pain    Lumbar Extension 30 with pain   Lumbar - Right Side Bend 25% no pain   Lumbar - Left Side Bend 25% no pain    Lumbar - Right Rotation 35%   Lumbar - Left Rotation 35%   Strength   Right Hip Flexion 4+/5   Right Hip Extension 3+/5   Right Hip ABduction 5/5   Left Hip Flexion 3+/5   Left Hip Extension 2+/5   Left Hip ABduction 3+/5   Right Knee Flexion 5/5   Right Knee Extension 5/5   Left Knee Flexion 3+/5   Left Knee Extension 3+/5   Special Tests    Special Tests Hip Special Tests   Hip Special Tests  Trendelenberg Test   Trendelenburg Test   Findings Positive   Side Left   Ambulation/Gait   Gait Pattern Step-to pattern;Decreased hip/knee flexion - left;Decreased weight shift to left;Wide base of support      MHP with IFC for 15 min in prone to tolerance to lumbar spine.         PT Education - 06/10/15 1134    Education provided Yes   Education Details being aware of her form when working with personal trainer, importance of proper shoe wear, a cause of her foot turning in (weakness)   Person(s) Educated Patient   Methods Explanation   Comprehension Verbalized understanding           PT Short Term Goals - 06/10/15 1150    PT SHORT TERM GOAL #1   Title Pt will increase hamstring flexibility on the L LE to match the R to assist in normalizing gait pattern   Time 4   Period Weeks   Status New   PT SHORT TERM GOAL #2   Title PT will report ability  to sit for 103minutes without an increase in pain and numbness in order to enjoy her lunch break.   Baseline able to sit for 26min   Time 4   PT SHORT TERM GOAL #3   Title I  with initial HEP   Time 2   Period Weeks   Status New           PT Long Term Goals - 06/10/15 1511    PT LONG TERM GOAL #1   Title pt will be I with advanced HEP   Time 8   Period Weeks   Status New   PT LONG TERM GOAL #2   Title Pt will report decreased back pain to 3/10 with normal daily activities (non-work)   Time 8   Period Weeks   Status New   PT LONG TERM GOAL #3   Title Pt will report improved ability to walk for extended periods (up an hour) with a min  increase in pain in order to perform work duties   Time 8   Period Weeks   Status New   PT LONG TERM GOAL #4   Title Pt will demo normalized gait pattern indicating decreased pain and increased strength in L LE.   Time 8   Period Weeks   Status New   PT LONG TERM GOAL #5   Title Pt will demonstrate appropriate body mechanics when pushing, pulling, and lifting heavy objects in order to prevent injury in the future   Time 8   Period Weeks   Status New               Plan - 06/10/15 1140    Clinical Impression Statement Julieanne had an L anterior hip replacement in Feb and is now experiencing L sided back pain. Pt presents with back pain, decreased strength, ROM, loss of sensation in L LE. Pt is limited at work due to her gait, she is also limited in sitting for long periods, walking, pushing, pulling, and lifting. Deficits are due to pain, weakness, loss of sensation in L LE. Pt would benefit form skilled PT services in order to address the specified deficits and assist  her in increasing her strength, ROM, body mechanics and  normalizing her gait pattern in order to prevent an increase in R hip pain due to compensation.   Pt will benefit from skilled therapeutic intervention in order to improve on the following deficits Abnormal gait;Decreased range of motion;Difficulty walking;Pain;Decreased activity tolerance;Decreased balance;Improper body mechanics;Impaired flexibility;Decreased mobility;Decreased strength;Impaired sensation;Postural dysfunction   Rehab Potential Good   PT Frequency 2x / week   PT Duration 6 weeks   PT Treatment/Interventions Iontophoresis 4mg /ml Dexamethasone;Functional mobility training;Patient/family education;Passive range of motion;Therapeutic activities;Moist Heat;Therapeutic exercise;Dry needling;Taping;Balance training;Electrical Stimulation;Neuromuscular re-education;Manual techniques;Gait training   PT Next Visit Plan core exercises, hamstring stretching, e stim, hot pack, nustep or bike   PT Home Exercise Plan none given but it is a priority   Consulted and Agree with Plan of Care Patient         Problem List Patient Active Problem List   Diagnosis Date Noted  . 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 Dizziness and giddiness 02/07/2013  . Cough 09/23/2011  . Hypertension 08/11/2011  . Obesity 08/11/2011    PAA,JENNIFER 06/10/2015, 3:44 PM  [facility] [name] [address] [place], [place], [address] Phone: [phone]   Fax:  [phone]   Raeford Razor, PT 06/10/2015 3:44 PM Phone: [phone] Fax: [phone]

## 2015-06-18 ENCOUNTER — Ambulatory Visit (HOSPITAL_COMMUNITY)
Admission: RE | Admit: 2015-06-18 | Discharge: 2015-06-18 | Disposition: A | Discharge status: Home / Self Care | Payer: 59 | Source: Ambulatory Visit | Attending: Family Medicine | Admitting: Family Medicine

## 2015-06-18 DIAGNOSIS — R05 Cough: Secondary | ICD-10-CM | POA: Insufficient documentation

## 2015-06-18 LAB — PULMONARY FUNCTION TEST
DL/VA % pred: 82 %
DL/VA: 4.17 ml/min/mmHg/L
DLCO UNC % PRED: 91 %
DLCO unc: 24.53 ml/min/mmHg
FEF 25-75 POST: 1.59 L/s
FEF 25-75 PRE: 1.2 L/s
FEF2575-%Change-Post: 32 %
FEF2575-%PRED-POST: 64 %
FEF2575-%Pred-Pre: 48 %
FEV1-%Change-Post: 9 %
FEV1-%Pred-Post: 78 %
FEV1-%Pred-Pre: 71 %
FEV1-POST: 2.15 L
FEV1-PRE: 1.96 L
FEV1FVC-%Change-Post: -1 %
FEV1FVC-%Pred-Pre: 87 %
FEV6-%Change-Post: 10 %
FEV6-%Pred-Post: 91 %
FEV6-%Pred-Pre: 82 %
FEV6-POST: 3.14 L
FEV6-Pre: 2.85 L
FEV6FVC-%Change-Post: -1 %
FEV6FVC-%PRED-POST: 101 %
FEV6FVC-%Pred-Pre: 102 %
FVC-%Change-Post: 11 %
FVC-%Pred-Post: 90 %
FVC-%Pred-Pre: 80 %
FVC-PRE: 2.87 L
FVC-Post: 3.21 L
PRE FEV1/FVC RATIO: 68 %
Post FEV1/FVC ratio: 67 %
Post FEV6/FVC ratio: 98 %
Pre FEV6/FVC Ratio: 99 %
RV % pred: 100 %
RV: 2.1 L
TLC % PRED: 91 %
TLC: 4.91 L

## 2015-06-18 MED ORDER — ALBUTEROL SULFATE (2.5 MG/3ML) 0.083% IN NEBU
2.5000 mg | INHALATION_SOLUTION | Freq: Once | RESPIRATORY_TRACT | Status: AC
  Administered 2015-06-18: 2.5 mg via RESPIRATORY_TRACT

## 2015-06-22 ENCOUNTER — Ambulatory Visit: Payer: PRIVATE HEALTH INSURANCE | Attending: Family Medicine | Admitting: Physical Therapy

## 2015-06-22 ENCOUNTER — Ambulatory Visit: Payer: 59 | Admitting: Physical Therapy

## 2015-06-22 DIAGNOSIS — M25552 Pain in left hip: Secondary | ICD-10-CM | POA: Diagnosis present

## 2015-06-22 DIAGNOSIS — M5416 Radiculopathy, lumbar region: Secondary | ICD-10-CM | POA: Insufficient documentation

## 2015-06-22 DIAGNOSIS — M5417 Radiculopathy, lumbosacral region: Secondary | ICD-10-CM | POA: Insufficient documentation

## 2015-06-22 DIAGNOSIS — M5440 Lumbago with sciatica, unspecified side: Secondary | ICD-10-CM | POA: Insufficient documentation

## 2015-06-22 DIAGNOSIS — R208 Other disturbances of skin sensation: Secondary | ICD-10-CM | POA: Insufficient documentation

## 2015-06-22 DIAGNOSIS — R293 Abnormal posture: Secondary | ICD-10-CM | POA: Insufficient documentation

## 2015-06-22 DIAGNOSIS — R269 Unspecified abnormalities of gait and mobility: Secondary | ICD-10-CM | POA: Diagnosis present

## 2015-06-22 DIAGNOSIS — R29898 Other symptoms and signs involving the musculoskeletal system: Secondary | ICD-10-CM | POA: Insufficient documentation

## 2015-06-24 ENCOUNTER — Ambulatory Visit: Payer: PRIVATE HEALTH INSURANCE | Admitting: Physical Therapy

## 2015-06-24 DIAGNOSIS — M5417 Radiculopathy, lumbosacral region: Secondary | ICD-10-CM | POA: Diagnosis not present

## 2015-06-24 DIAGNOSIS — M5416 Radiculopathy, lumbar region: Secondary | ICD-10-CM

## 2015-06-24 DIAGNOSIS — R209 Unspecified disturbances of skin sensation: Secondary | ICD-10-CM

## 2015-06-24 DIAGNOSIS — M25552 Pain in left hip: Secondary | ICD-10-CM

## 2015-06-24 DIAGNOSIS — R29898 Other symptoms and signs involving the musculoskeletal system: Secondary | ICD-10-CM

## 2015-06-24 DIAGNOSIS — R293 Abnormal posture: Secondary | ICD-10-CM

## 2015-06-24 DIAGNOSIS — M5440 Lumbago with sciatica, unspecified side: Secondary | ICD-10-CM

## 2015-06-24 DIAGNOSIS — R269 Unspecified abnormalities of gait and mobility: Secondary | ICD-10-CM

## 2015-06-24 NOTE — Therapy (Signed)
[place], [place], [address] Phone: [phone]   Fax:  [phone]  Physical Therapy Treatment  Patient Details  Name: Nicole [name] MRN: [id number] Date of Birth: 1956/06/08 Referring Provider:  Gaynelle Arabian, MD  Encounter Date: 06/22/2015  Visit#2 of 12 Renewal needed 07/22/2015 Time In: 7989 Time Out:1103  Past Medical History  Diagnosis Date  . Complication of anesthesia   . PONV (postoperative nausea and vomiting)   . Family history of anesthesia complication     N&V- Mother   . Heart murmur     since birth  . Hypertension     followed by Dr. Marisue Humble   . H/O hiatal hernia     small hiatal hernia, has had endoscopy & colondoscopy  . GERD (gastroesophageal reflux disease)     Chron's disease, not medically treating currently   . Arthritis     lumbar- HNP, /w myelopathy  . Bronchitis     used albuterol , Sept. 2013, all clear now   . Asthma     /w reflux , consult /w Dr. Wert-2013 / USES INHALER PRN INFREQUENTLY  . Numbness in left leg   . Stress incontinence   . Difficulty sleeping     DUE TO PAIN  . Anxiety   . H/O vertigo     Past Surgical History  Procedure Laterality Date  . Cholecystectomy  03    laparoscopic  . Lumbar laminectomy/decompression microdiscectomy  09/10/2012    Procedure: LUMBAR LAMINECTOMY/DECOMPRESSION MICRODISCECTOMY 1 LEVEL;  Surgeon: Kristeen Miss, MD;  Location: [facility];  Service: Neurosurgery;  Laterality: Left;  Left Lumbar Five-Sacral One Microdiscectomy  . Back surgery  2013  . Total hip arthroplasty Left 12/24/2014    Procedure: LEFT TOTAL HIP ARTHROPLASTY ANTERIOR APPROACH;  Surgeon: Mauri Pole, MD;  Location: [facility];  Service: Orthopedics;  Laterality: Left;    There were no vitals filed for this visit.  Visit Diagnosis:  No diagnosis found.    Treatment:Exercise Hip add  Adductor stretch 3 reps 30 second holds Hamstring stretch 3 reps   X 3 Bridges 10 reps with cues.  Checked leg length.   Manual sot tissue work lateral Thigh.  Tissue softened.  Also Lymph system activation with deep breaths,  Lymph nodes compression with open hand sweep at groin to stomach 5 reps, then retrograde soft tissue work  with leg elevated. Supine abdominal bracing with bent knee march and clamshells. Alternating with instruction .  Hip ER stretch with feet together knees apart, tightness initially eased to more ROM. Previous exercises reviewed with minimal cues.                           PT Short Term Goals - 06/10/15 1150    PT SHORT TERM GOAL #1   Title Pt will increase hamstring flexibility on the L LE to match the R to assist in normalizing gait pattern   Time 4   Period Weeks   Status New   PT SHORT TERM GOAL #2   Title PT will report ability to sit for 25minutes without an increase in pain and numbness in order to enjoy her lunch break.   Baseline able to sit for 8min   Time 4   PT SHORT TERM GOAL #3   Title I  with initial HEP   Time 2   Period Weeks   Status New  PT Long Term Goals - 06/10/15 1511    PT LONG TERM GOAL #1   Title pt will be I with advanced HEP   Time 8   Period Weeks   Status New   PT LONG TERM GOAL #2   Title Pt will report decreased back pain to 3/10 with normal daily activities (non-work)   Time 8   Period Weeks   Status New   PT LONG TERM GOAL #3   Title Pt will report improved ability to walk for extended periods (up an hour) with a min  increase in pain in order to perform work duties   Time 8   Period Weeks   Status New   PT LONG TERM GOAL #4   Title Pt will demo normalized gait pattern indicating decreased pain and increased strength in L LE.   Time 8   Period Weeks   Status New   PT LONG TERM GOAL #5   Title Pt will demonstrate appropriate body mechanics when pushing, pulling, and lifting heavy objects in order to prevent injury in the future   Time 8    Period Weeks   Status New       Clinical Impression:Focus on stretching and edema, and stabilization.  No pain increased with session.  Beginning exercises tolerated well.  Plan: Continue strengthening core, hip,  Review home exercise        Problem List Patient Active Problem List   Diagnosis Date Noted  . 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 Dizziness and giddiness 02/07/2013  . Cough 09/23/2011  . Hypertension 08/11/2011  . Obesity 08/11/2011    Nicole [name] 06/24/2015, 4:40 PM  [facility] [address] [place], [place], [address] Phone: [phone]   Fax:  [phone]    Melvenia Needles, PTA 06/24/2015 4:40 PM Phone: [phone] Fax: [phone]

## 2015-06-24 NOTE — Therapy (Signed)
[place], [place], [address] Phone: [phone]   Fax:  [phone]  Physical Therapy Treatment  Patient Details  Name: Nicole [name] MRN: [id number] Date of Birth: [birth year] Referring Provider:  Gaynelle Arabian, MD  Encounter Date: 06/24/2015      PT End of Session - 06/24/15 1202    Visit Number 2   Number of Visits 12   Date for PT Re-Evaluation 07/22/15   PT Start Time 6073   PT Stop Time 1245   PT Time Calculation (min) 60 min      Past Medical History  Diagnosis Date  . Complication of anesthesia   . PONV (postoperative nausea and vomiting)   . Family history of anesthesia complication     N&V- Mother   . Heart murmur     since birth  . Hypertension     followed by Dr. Marisue Humble   . H/O hiatal hernia     small hiatal hernia, has had endoscopy & colondoscopy  . GERD (gastroesophageal reflux disease)     Chron's disease, not medically treating currently   . Arthritis     lumbar- HNP, /w myelopathy  . Bronchitis     used albuterol , Sept. 2013, all clear now   . Asthma     /w reflux , consult /w Dr. Wert-2013 / USES INHALER PRN INFREQUENTLY  . Numbness in left leg   . Stress incontinence   . Difficulty sleeping     DUE TO PAIN  . Anxiety   . H/O vertigo     Past Surgical History  Procedure Laterality Date  . Cholecystectomy  03    laparoscopic  . Lumbar laminectomy/decompression microdiscectomy  09/10/2012    Procedure: LUMBAR LAMINECTOMY/DECOMPRESSION MICRODISCECTOMY 1 LEVEL;  Surgeon: Kristeen Miss, MD;  Location: [facility];  Service: Neurosurgery;  Laterality: Left;  Left Lumbar Five-Sacral One Microdiscectomy  . Back surgery  2013  . Total hip arthroplasty Left 12/24/2014    Procedure: LEFT TOTAL HIP ARTHROPLASTY ANTERIOR APPROACH;  Surgeon: Mauri Pole, MD;  Location: [facility];  Service: Orthopedics;  Laterality: Left;    There were no vitals filed for this  visit.  Visit Diagnosis:  Midline low back pain with sciatica, sciatica laterality unspecified  Lumbar back pain with radiculopathy affecting left lower extremity  Abnormality of gait  Weakness of left hip  Sensory disturbance  Lumbar radiculopathy  Weakness of left lower extremity  Pain in joint, pelvic region and thigh, left  Posture abnormality      Subjective Assessment - 06/24/15 1200    Subjective My back is hurting a little bit. After work my feet and left shin were tingling.    Currently in Pain? Yes   Pain Score 4    Pain Location Back   Pain Orientation Mid;Lower   Pain Descriptors / Indicators Tightness                         [facility] Adult PT Treatment/Exercise - 06/24/15 1206    Self-Care   Self-Care ADL's   ADL's Log Roll    Lumbar Exercises: Supine   Ab Set 5 reps   AB Set Limitations to find neutral   Clam 10 reps   Clam Limitations cues fro neutral   Heel Slides 10 reps   Heel Slides Limitations bilateral   Bent Knee Raise 20 reps   Bridge 10 reps   Bridge Limitations  added 10 clams   Straight Leg Raise 10 reps   Straight Leg Raises Limitations bilateral   Lumbar Exercises: Sidelying   Clam 20 reps   Lumbar Exercises: Prone   Other Prone Lumbar Exercises hamstring curls AROM, then 2# 10 x 2 each with multifidus engagement.    Knee/Hip Exercises: Seated   Long Arc Quad Strengthening;Left;20 reps;Weights   Long Arc Quad Weight 2 lbs.   Modalities   Modalities Moist Heat   Moist Heat Therapy   Number Minutes Moist Heat 15 Minutes   Moist Heat Location Lumbar Spine                  PT Short Term Goals - 06/10/15 1150    PT SHORT TERM GOAL #1   Title Pt will increase hamstring flexibility on the L LE to match the R to assist in normalizing gait pattern   Time 4   Period Weeks   Status New   PT SHORT TERM GOAL #2   Title PT will report ability to sit for 38minutes without an increase in pain and numbness in order  to enjoy her lunch break.   Baseline able to sit for 46min   Time 4   PT SHORT TERM GOAL #3   Title I  with initial HEP   Time 2   Period Weeks   Status New           PT Long Term Goals - 06/10/15 1511    PT LONG TERM GOAL #1   Title pt will be I with advanced HEP   Time 8   Period Weeks   Status New   PT LONG TERM GOAL #2   Title Pt will report decreased back pain to 3/10 with normal daily activities (non-work)   Time 8   Period Weeks   Status New   PT LONG TERM GOAL #3   Title Pt will report improved ability to walk for extended periods (up an hour) with a min  increase in pain in order to perform work duties   Time 8   Period Weeks   Status New   PT LONG TERM GOAL #4   Title Pt will demo normalized gait pattern indicating decreased pain and increased strength in L LE.   Time 8   Period Weeks   Status New   PT LONG TERM GOAL #5   Title Pt will demonstrate appropriate body mechanics when pushing, pulling, and lifting heavy objects in order to prevent injury in the future   Time 8   Period Weeks   Status New               Plan - 06/24/15 1237    Clinical Impression Statement Addresses pts deficits found on eval and worked on hip, knee and core strengthening.  Pt is currently working with personal trainer 3 x pr week.   PT Next Visit Plan core exercises, hamstring stretching, e stim, hot pack, nustep or bike        Problem List Patient Active Problem List   Diagnosis Date Noted  . 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 Dizziness and giddiness 02/07/2013  . Cough 09/23/2011  . Hypertension 08/11/2011  . Obesity 08/11/2011    Dorene Ar, PTA 06/24/2015, 1:00 PM  [facility] [address] [place], [place], [address] Phone: [phone]   Fax:  [phone]

## 2015-06-29 ENCOUNTER — Institutional Professional Consult (permissible substitution): Payer: Self-pay | Admitting: Internal Medicine

## 2015-06-29 ENCOUNTER — Ambulatory Visit: Payer: PRIVATE HEALTH INSURANCE | Admitting: Physical Therapy

## 2015-06-29 DIAGNOSIS — R269 Unspecified abnormalities of gait and mobility: Secondary | ICD-10-CM

## 2015-06-29 DIAGNOSIS — M25552 Pain in left hip: Secondary | ICD-10-CM

## 2015-06-29 DIAGNOSIS — M5417 Radiculopathy, lumbosacral region: Secondary | ICD-10-CM | POA: Diagnosis not present

## 2015-06-29 DIAGNOSIS — M5416 Radiculopathy, lumbar region: Secondary | ICD-10-CM

## 2015-06-29 DIAGNOSIS — R29898 Other symptoms and signs involving the musculoskeletal system: Secondary | ICD-10-CM

## 2015-06-29 DIAGNOSIS — M5440 Lumbago with sciatica, unspecified side: Secondary | ICD-10-CM

## 2015-06-29 DIAGNOSIS — R293 Abnormal posture: Secondary | ICD-10-CM

## 2015-06-29 DIAGNOSIS — R209 Unspecified disturbances of skin sensation: Secondary | ICD-10-CM

## 2015-06-29 NOTE — Patient Instructions (Signed)
   Bridge with Clam See pic    Lower Trunk Rotation Stretch   Keeping back flat and feet together, rotate knees to left side. Hold _10-15___ seconds. Repeat _10___ times per set. Do ___1-2_ sets per session. Do __2__ sessions per day.  http://orth.exer.us/123   Copyright  VHI. All rights reserved.

## 2015-06-29 NOTE — Therapy (Signed)
[name], [place], [address] Phone: [phone]   Fax:  [phone]  Physical Therapy Treatment  Patient Details  Name: Nicole [name] MRN: [id number] Date of Birth: 09/02/1956 Referring Provider:  Gaynelle Arabian, MD  Encounter Date: 06/29/2015      PT End of Session - 06/29/15 1213    Visit Number 4   Number of Visits 12   PT Start Time 2951   PT Stop Time 1150   PT Time Calculation (min) 45 min   Activity Tolerance Patient tolerated treatment well   Behavior During Therapy [facility] for tasks assessed/performed      Past Medical History  Diagnosis Date  . Complication of anesthesia   . PONV (postoperative nausea and vomiting)   . Family history of anesthesia complication     N&V- Mother   . Heart murmur     since birth  . Hypertension     followed by Dr. Marisue [name]   . H/O hiatal hernia     small hiatal hernia, has had endoscopy & colondoscopy  . GERD (gastroesophageal reflux disease)     Chron's disease, not medically treating currently   . Arthritis     lumbar- HNP, /w myelopathy  . Bronchitis     used albuterol , Sept. 2013, all clear now   . Asthma     /w reflux , consult /w Dr. Wert-2013 / USES INHALER PRN INFREQUENTLY  . Numbness in left leg   . Stress incontinence   . Difficulty sleeping     DUE TO PAIN  . Anxiety   . H/O vertigo     Past Surgical History  Procedure Laterality Date  . Cholecystectomy  03    laparoscopic  . Lumbar laminectomy/decompression microdiscectomy  09/10/2012    Procedure: LUMBAR LAMINECTOMY/DECOMPRESSION MICRODISCECTOMY 1 LEVEL;  Surgeon: Kristeen Miss, MD;  Location: [facility];  Service: Neurosurgery;  Laterality: Left;  Left Lumbar Five-Sacral One Microdiscectomy  . Back surgery  2013  . Total hip arthroplasty Left 12/24/2014    Procedure: LEFT TOTAL HIP ARTHROPLASTY ANTERIOR APPROACH;  Surgeon: Nicole Pole, MD;  Location: [facility];  Service:  Orthopedics;  Laterality: Left;    There were no vitals filed for this visit.  Visit Diagnosis:  Midline low back pain with sciatica, sciatica laterality unspecified  Lumbar back pain with radiculopathy affecting left lower extremity  Abnormality of gait  Weakness of left hip  Sensory disturbance  Lumbar radiculopathy  Weakness of left lower extremity  Pain in joint, pelvic region and thigh, left  Posture abnormality      Subjective Assessment - 06/29/15 1201    Subjective no pain today. Saw Doctor last Thursday instructed in no lifting over 25 lbs, no rotation, no pulling   Currently in Pain? No/denies             [facility] Adult PT Treatment/Exercise - 06/29/15 1118    Exercises   Exercises Lumbar;Knee/Hip   Lumbar Exercises: Stretches   Active Hamstring Stretch 3 reps;30 seconds   Double Knee to Chest Stretch 1 rep;20 seconds   Lower Trunk Rotation 2 reps;20 seconds   Pelvic Tilt 3 reps;10 seconds   ITB Stretch 3 reps;30 seconds   Lumbar Exercises: Supine   Clam 15 reps   Clam Limitations tactile cues for hips not to roll backward   Bridge 10 reps   Other Supine Lumbar Exercises foam rollare stabilization exercises alternating arm raise, alternating leg raise, opposite  arm/leg raise x 10/each   Lumbar Exercises: Sidelying   Clam 15 reps   Manual Therapy   Manual Therapy Soft tissue mobilization   Soft tissue mobilization low back, 63mn                PT Education - 06/29/15 1210    Education provided Yes   Education Details body mechanics at work, strengthening anterior tib   Person(s) Educated Patient   Methods Explanation;Handout   Comprehension Verbalized understanding;Returned demonstration          PT Short Term Goals - 06/29/15 1111    PT SHORT TERM GOAL #1   Title Pt will increase hamstring flexibility on the L LE to match the R to assist in normalizing gait pattern   Time 4   Period Weeks   Status Partially Met  L LE matches the  right in ROM, gait has not normalized   PT SHORT TERM GOAL #2   Title PT will report ability to sit for 368mutes without an increase in pain and numbness in order to enjoy her lunch break.   Baseline instructed by doctor to not sit long periods so she moves around   Time 4   Period Weeks   Status On-going   PT SHORT TERM GOAL #3   Title I with initial HEP   Time 2   Period Weeks   Status On-going   PT SHORT TERM GOAL #4   Title She will report tingling in lower leg and foot decr 25%    Period Weeks   Status On-going           PT Long Term Goals - 06/29/15 1113    PT LONG TERM GOAL #1   Title pt will be I with advanced HEP   Time 8   Period Weeks   Status On-going   PT LONG TERM GOAL #2   Title Pt will report decreased back pain to 3/10 with normal daily activities (non-work)   Baseline In back but not thigh/hip   Period Weeks   Status On-going   PT LONG TERM GOAL #3   Title Pt will report improved ability to walk for extended periods (up an hour) with a min  increase in pain in order to perform work duties   Time 8   Period Weeks   Status On-going   PT LONG TERM GOAL #4   Title Pt will demo normalized gait pattern indicating decreased pain and increased strength in L LE.   Time 8   Status On-going   PT LONG TERM GOAL #5   Title Pt will demonstrate appropriate body mechanics when pushing, pulling, and lifting heavy objects in order to prevent injury in the future   Time 8   Period Weeks   Status On-going               Plan - 06/29/15 1214    Clinical Impression Statement Pt. is progressing toward goals. Session focus was on stretching hamstrings, ITB, and low back today. Session concluded with manual massage. Pt. also saw doctor last week and was given lifting precautions, rotation and pulling as well as perscribed motrin and flexiril.    PT Next Visit Plan stretching hamstrings and lumbar, body mechanics education, manual massage if needed, HEP:  ADD  HAMSTRING, PIRIFORMIS, Transverse abd (give printout of ALL)   PT Home Exercise Plan anterior tib (ankle DF) with band she has at home (green), bridge with clams, trunk rotation  stretch   Consulted and Agree with Plan of Care Patient     All goals in progress, patient has not had a clear HEP to this point.  Will remedy next week.  Time spent today in reviewing posture body mechanics.    Problem List Patient Active Problem List   Diagnosis Date Noted  . 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 Dizziness and giddiness 02/07/2013  . Cough 09/23/2011  . Hypertension 08/11/2011  . Obesity 08/11/2011   Radonna Ricker, SPT  PAA,JENNIFER 06/29/2015, 1:00 PM  [facility] [address] [place], [place], [address] Phone: [phone]   Fax:  [phone]   Raeford Razor, PT 06/29/2015 1:01 PM Phone: [phone] Fax: [phone]

## 2015-07-01 ENCOUNTER — Ambulatory Visit: Payer: PRIVATE HEALTH INSURANCE | Admitting: Physical Therapy

## 2015-07-01 DIAGNOSIS — R29898 Other symptoms and signs involving the musculoskeletal system: Secondary | ICD-10-CM

## 2015-07-01 DIAGNOSIS — R209 Unspecified disturbances of skin sensation: Secondary | ICD-10-CM

## 2015-07-01 DIAGNOSIS — M5417 Radiculopathy, lumbosacral region: Secondary | ICD-10-CM | POA: Diagnosis not present

## 2015-07-01 DIAGNOSIS — M5416 Radiculopathy, lumbar region: Secondary | ICD-10-CM

## 2015-07-01 DIAGNOSIS — R269 Unspecified abnormalities of gait and mobility: Secondary | ICD-10-CM

## 2015-07-01 DIAGNOSIS — M25552 Pain in left hip: Secondary | ICD-10-CM

## 2015-07-01 DIAGNOSIS — R293 Abnormal posture: Secondary | ICD-10-CM

## 2015-07-01 DIAGNOSIS — M5440 Lumbago with sciatica, unspecified side: Secondary | ICD-10-CM

## 2015-07-01 NOTE — Therapy (Signed)
[facility] [address] [place], [place], [address] Phone: [phone]   Fax:  [phone]  Physical Therapy Treatment  Patient Details  Name: Nicole [name] MRN: [id number] Date of Birth: 09/05/1956 Referring Provider:  Ehinger, Robert, MD  Encounter Date: 07/01/2015      PT End of Session - 07/01/15 1258    Visit Number 5   Number of Visits 12   Date for PT Re-Evaluation 07/22/15   PT Start Time 1150   PT Stop Time 1245   PT Time Calculation (min) 55 min   Activity Tolerance Patient tolerated treatment well   Behavior During Therapy WFL for tasks assessed/performed      Past Medical History  Diagnosis Date  . Complication of anesthesia   . PONV (postoperative nausea and vomiting)   . Family history of anesthesia complication     N&V- Mother   . Heart murmur     since birth  . Hypertension     followed by Dr. Ehinger   . H/O hiatal hernia     small hiatal hernia, has had endoscopy & colondoscopy  . GERD (gastroesophageal reflux disease)     Chron's disease, not medically treating currently   . Arthritis     lumbar- HNP, /w myelopathy  . Bronchitis     used albuterol , Sept. 2013, all clear now   . Asthma     /w reflux , consult /w Dr. Wert-2013 / USES INHALER PRN INFREQUENTLY  . Numbness in left leg   . Stress incontinence   . Difficulty sleeping     DUE TO PAIN  . Anxiety   . H/O vertigo     Past Surgical History  Procedure Laterality Date  . Cholecystectomy  03    laparoscopic  . Lumbar laminectomy/decompression microdiscectomy  09/10/2012    Procedure: LUMBAR LAMINECTOMY/DECOMPRESSION MICRODISCECTOMY 1 LEVEL;  Surgeon: Henry Elsner, MD;  Location: [facility];  Service: Neurosurgery;  Laterality: Left;  Left Lumbar Five-Sacral One Microdiscectomy  . Back surgery  2013  . Total hip arthroplasty Left 12/24/2014    Procedure: LEFT TOTAL HIP ARTHROPLASTY ANTERIOR APPROACH;  Surgeon: Matthew D Olin,  MD;  Location: [facility];  Service: Orthopedics;  Laterality: Left;    There were no vitals filed for this visit.  Visit Diagnosis:  Midline low back pain with sciatica, sciatica laterality unspecified  Lumbar back pain with radiculopathy affecting left lower extremity  Abnormality of gait  Weakness of left hip  Sensory disturbance  Lumbar radiculopathy  Weakness of left lower extremity  Pain in joint, pelvic region and thigh, left  Posture abnormality      Subjective Assessment - 07/01/15 1155    Subjective Pt states after work she felt very bad, increased pain in back and legs, unable to feel her heel on L foot. She took her meds and put heat on it when she got home. Pain is keeping her awake at night. Pt explains her work conditions each visit and is having to do alot of bending to low shelves at ankle height, standing for 3 or more hours at a time, lifting and pushing items for surgeries and is doing the best she can with the stretches she was told to do to alleviate her pain.    Currently in Pain? Yes   Pain Score 5    Pain Location Back   Aggravating Factors  working long hours   Pain Relieving Factors heat             [facility] Adult PT Treatment/Exercise - 07/01/15 1200    Lumbar Exercises: Stretches   Active Hamstring Stretch 3 reps;30 seconds  supine and seated   Single Knee to Chest Stretch 3 reps;30 seconds   Lower Trunk Rotation 2 reps;30 seconds   Piriformis Stretch 3 reps;30 seconds   Piriformis Stretch Limitations seated and laying options for work and home   Lumbar Exercises: Quadruped   Madcat/Old Horse 5 reps;Other (comment)  emphasis on the cat stretch   Opposite Arm/Leg Raise Right arm/Left leg;Left arm/Right leg;5 reps;Other (comment)  10 sec hold   Opposite Arm/Leg Raise Limitations weaker on lifting the left leg   Knee/Hip Exercises: Aerobic   Nustep 5 in warmup 4 resistance   Manual Therapy   Manual Therapy Soft tissue mobilization;Myofascial  release   Manual therapy comments palpable mass on L hip pt stated "its been there since my surgery doctor said it will eventually go away in about a year" however with manual massage techniques this should be resolved sooner   Soft tissue mobilization L hip, in sidelying  Pt stated area felt softer afterwards   Myofascial Release Rock tool used                  PT Short Term Goals - 06/29/15 1111    PT SHORT TERM GOAL #1   Title Pt will increase hamstring flexibility on the L LE to match the R to assist in normalizing gait pattern   Time 4   Period Weeks   Status Partially Met  L LE matches the right in ROM, gait has not normalized   PT SHORT TERM GOAL #2   Title PT will report ability to sit for 30minutes without an increase in pain and numbness in order to enjoy her lunch break.   Baseline instructed by doctor to not sit long periods so she moves around   Time 4   Period Weeks   Status On-going   PT SHORT TERM GOAL #3   Title I with initial HEP   Time 2   Period Weeks   Status On-going   PT SHORT TERM GOAL #4   Title She will report tingling in lower leg and foot decr 25%    Period Weeks   Status On-going           PT Long Term Goals - 06/29/15 1113    PT LONG TERM GOAL #1   Title pt will be I with advanced HEP   Time 8   Period Weeks   Status On-going   PT LONG TERM GOAL #2   Title Pt will report decreased back pain to 3/10 with normal daily activities (non-work)   Baseline In back but not thigh/hip   Period Weeks   Status On-going   PT LONG TERM GOAL #3   Title Pt will report improved ability to walk for extended periods (up an hour) with a min  increase in pain in order to perform work duties   Time 8   Period Weeks   Status On-going   PT LONG TERM GOAL #4   Title Pt will demo normalized gait pattern indicating decreased pain and increased strength in L LE.   Time 8   Status On-going   PT LONG TERM GOAL #5   Title Pt will demonstrate  appropriate body mechanics when pushing, pulling, and lifting heavy objects in order to prevent injury in the future   Time 8   Period Weeks     Status On-going               Plan - 07/01/15 1306    Clinical Impression Statement Sessions continue to focus on stretching [name]'s back and LE. Throughout the session her work ergonomics have been discussed. She was give variations of exercises to perform while at work standing/sitting for hours at a time to assist in alleviating her pain. She will  benefit from skillled PT services to address her back pain, strengthen hip/core and,soften palpable knot in lateral L hip., and to continue educating her on proper body mechanics and pain alleviating acitivites for work.    Clinical Impairments Affecting Rehab Potential Pt. progress may be limited by the patients long hours of sitting and standing without breaks and with less than optimal working conditions.   PT Next Visit Plan focus on back and LE stretches, work ergonomics, manual to back if needed,massage to hip,    PT Home Exercise Plan piriformis variations. hamstring. cat/cow. childs pose   Consulted and Agree with Plan of Care Patient        Problem List Patient Active Problem List   Diagnosis Date Noted  . 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 Dizziness and giddiness 02/07/2013  . Cough 09/23/2011  . Hypertension 08/11/2011  . Obesity 08/11/2011   Radonna Ricker, SPT  PAA,JENNIFER 07/01/2015, 1:40 PM  [place], [place], [address] Phone: [phone]   Fax:  [phone]  Raeford Razor, PT 07/01/2015 1:41 PM Phone: [phone] Fax: [phone]

## 2015-07-08 ENCOUNTER — Ambulatory Visit: Payer: PRIVATE HEALTH INSURANCE | Admitting: Physical Therapy

## 2015-07-08 DIAGNOSIS — M25552 Pain in left hip: Secondary | ICD-10-CM

## 2015-07-08 DIAGNOSIS — M5416 Radiculopathy, lumbar region: Secondary | ICD-10-CM

## 2015-07-08 DIAGNOSIS — M5417 Radiculopathy, lumbosacral region: Secondary | ICD-10-CM | POA: Diagnosis not present

## 2015-07-08 DIAGNOSIS — R269 Unspecified abnormalities of gait and mobility: Secondary | ICD-10-CM

## 2015-07-08 DIAGNOSIS — R29898 Other symptoms and signs involving the musculoskeletal system: Secondary | ICD-10-CM

## 2015-07-08 NOTE — Therapy (Signed)
[place], [place], [address] Phone: [phone]   Fax:  [phone]  Physical Therapy Treatment  Patient Details  Name: Nicole [name] MRN: [id number] Date of Birth: 01-20-1956 Referring Provider:  Gaynelle Arabian, MD  Encounter Date: 07/08/2015      PT End of Session - 07/08/15 1023    Visit Number 6   Number of Visits 12   Date for PT Re-Evaluation 07/22/15   PT Start Time 1019   PT Stop Time 1059   PT Time Calculation (min) 40 min   Activity Tolerance Patient tolerated treatment well   Behavior During Therapy [facility] for tasks assessed/performed      Past Medical History  Diagnosis Date  . Complication of anesthesia   . PONV (postoperative nausea and vomiting)   . Family history of anesthesia complication     N&V- Mother   . Heart murmur     since birth  . Hypertension     followed by Dr. Marisue Humble   . H/O hiatal hernia     small hiatal hernia, has had endoscopy & colondoscopy  . GERD (gastroesophageal reflux disease)     Chron's disease, not medically treating currently   . Arthritis     lumbar- HNP, /w myelopathy  . Bronchitis     used albuterol , Sept. 2013, all clear now   . Asthma     /w reflux , consult /w Dr. Wert-2013 / USES INHALER PRN INFREQUENTLY  . Numbness in left leg   . Stress incontinence   . Difficulty sleeping     DUE TO PAIN  . Anxiety   . H/O vertigo     Past Surgical History  Procedure Laterality Date  . Cholecystectomy  03    laparoscopic  . Lumbar laminectomy/decompression microdiscectomy  09/10/2012    Procedure: LUMBAR LAMINECTOMY/DECOMPRESSION MICRODISCECTOMY 1 LEVEL;  Surgeon: Kristeen Miss, MD;  Location: [place] [facility];  Service: Neurosurgery;  Laterality: Left;  Left Lumbar Five-Sacral One Microdiscectomy  . Back surgery  2013  . Total hip arthroplasty Left 12/24/2014    Procedure: LEFT TOTAL HIP ARTHROPLASTY ANTERIOR APPROACH;  Surgeon: Mauri Pole,  MD;  Location: [facility];  Service: Orthopedics;  Laterality: Left;    There were no vitals filed for this visit.  Visit Diagnosis:  Lumbar back pain with radiculopathy affecting left lower extremity  Abnormality of gait  Weakness of left lower extremity  Pain in joint, pelvic region and thigh, left      Subjective Assessment - 07/08/15 1024    Subjective I'm feeling better today. She has been off work for six days.   How long can you sit comfortably? 15 min but must move around after 5 minutes.   Currently in Pain? No/denies                         [facility] Adult PT Treatment/Exercise - 07/08/15 0001    Lumbar Exercises: Stretches   Active Hamstring Stretch 3 reps;30 seconds   ITB Stretch 1 rep;30 seconds  with strap   Piriformis Stretch 1 rep;30 seconds   Lumbar Exercises: Quadruped   Madcat/Old Horse 5 reps;Other (comment)   Opposite Arm/Leg Raise Right arm/Left leg;Left arm/Right leg;10 reps;Other (comment)  10 sec hold   Knee/Hip Exercises: Stretches   Gastroc Stretch Left;1 rep;30 seconds   Soleus Stretch Left;1 rep;30 seconds   Other Knee/Hip Stretches plantar fascia stretch x 30 sec  squatting  down on toes   Knee/Hip Exercises: Aerobic   Nustep 7 min 4 resistance   Manual Therapy   Manual Therapy Soft tissue mobilization;Myofascial release   Soft tissue mobilization L hip, in sidelying   Myofascial Release used roller to L hip, gluts and hamstrings                  PT Short Term Goals - 07/08/15 1036    PT SHORT TERM GOAL #1   Title Pt will increase hamstring flexibility on the L LE to match the R to assist in normalizing gait pattern   Time 4   Period Weeks   Status Achieved   PT SHORT TERM GOAL #2   Title PT will report ability to sit for 74minutes without an increase in pain and numbness in order to enjoy her lunch break.   Baseline instructed by doctor to not sit long periods so she moves around   Time 4   Period Weeks   Status  On-going   PT SHORT TERM GOAL #3   Title I with initial HEP   Time 2   Status Achieved   PT SHORT TERM GOAL #4   Title She will report tingling in lower leg and foot decr 25%    Time 3   Period Weeks   Status On-going           PT Long Term Goals - 06/29/15 1113    PT LONG TERM GOAL #1   Title pt will be I with advanced HEP   Time 8   Period Weeks   Status On-going   PT LONG TERM GOAL #2   Title Pt will report decreased back pain to 3/10 with normal daily activities (non-work)   Baseline In back but not thigh/hip   Period Weeks   Status On-going   PT LONG TERM GOAL #3   Title Pt will report improved ability to walk for extended periods (up an hour) with a min  increase in pain in order to perform work duties   Time 8   Period Weeks   Status On-going   PT LONG TERM GOAL #4   Title Pt will demo normalized gait pattern indicating decreased pain and increased strength in L LE.   Time 8   Status On-going   PT LONG TERM GOAL #5   Title Pt will demonstrate appropriate body mechanics when pushing, pulling, and lifting heavy objects in order to prevent injury in the future   Time 8   Period Weeks   Status On-going               Plan - 07/08/15 1334    Clinical Impression Statement Patient reports no pain today, but she has been off work for 6 days. Performed runners stretch for gastroc/sol and squatting plantar fascia stretch to address cramping. She responded well to manual using roller. STG #1 achieved. Left HS equal to right in flexibility.   PT Next Visit Plan focus on back and LE stretches, work ergonomics, manual to back if needed,massage to hip,         Problem List Patient Active Problem List   Diagnosis Date Noted  . 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 Dizziness and giddiness 02/07/2013  . Cough 09/23/2011  . Hypertension 08/11/2011  . Obesity 08/11/2011    Madelyn Flavors PT  07/08/2015, 1:41 PM  [facility] [address] [place], [place], [address] Phone: [phone]  Fax:  [phone]

## 2015-07-09 ENCOUNTER — Ambulatory Visit: Payer: PRIVATE HEALTH INSURANCE | Admitting: Physical Therapy

## 2015-07-09 DIAGNOSIS — R269 Unspecified abnormalities of gait and mobility: Secondary | ICD-10-CM

## 2015-07-09 DIAGNOSIS — M5440 Lumbago with sciatica, unspecified side: Secondary | ICD-10-CM

## 2015-07-09 DIAGNOSIS — R209 Unspecified disturbances of skin sensation: Secondary | ICD-10-CM

## 2015-07-09 DIAGNOSIS — M5416 Radiculopathy, lumbar region: Secondary | ICD-10-CM

## 2015-07-09 DIAGNOSIS — M5417 Radiculopathy, lumbosacral region: Secondary | ICD-10-CM | POA: Diagnosis not present

## 2015-07-09 DIAGNOSIS — R29898 Other symptoms and signs involving the musculoskeletal system: Secondary | ICD-10-CM

## 2015-07-09 DIAGNOSIS — R293 Abnormal posture: Secondary | ICD-10-CM

## 2015-07-09 DIAGNOSIS — M25552 Pain in left hip: Secondary | ICD-10-CM

## 2015-07-09 NOTE — Therapy (Signed)
[place], [place], [address] Phone: [phone]   Fax:  [phone]  Physical Therapy Treatment  Patient Details  Name: Nicole [name] MRN: [id number] Date of Birth: [birth year] Referring Provider:  Gaynelle Arabian, MD  Encounter Date: 07/09/2015      PT End of Session - 07/09/15 1330    Visit Number 7   Number of Visits 12   Date for PT Re-Evaluation 07/22/15   PT Start Time 1330   PT Stop Time 1410   PT Time Calculation (min) 40 min   Activity Tolerance Patient tolerated treatment well   Behavior During Therapy [facility] for tasks assessed/performed      Past Medical History  Diagnosis Date  . Complication of anesthesia   . PONV (postoperative nausea and vomiting)   . Family history of anesthesia complication     N&V- Mother   . Heart murmur     since birth  . Hypertension     followed by Dr. Marisue Humble   . H/O hiatal hernia     small hiatal hernia, has had endoscopy & colondoscopy  . GERD (gastroesophageal reflux disease)     Chron's disease, not medically treating currently   . Arthritis     lumbar- HNP, /w myelopathy  . Bronchitis     used albuterol , Sept. 2013, all clear now   . Asthma     /w reflux , consult /w Dr. Wert-2013 / USES INHALER PRN INFREQUENTLY  . Numbness in left leg   . Stress incontinence   . Difficulty sleeping     DUE TO PAIN  . Anxiety   . H/O vertigo     Past Surgical History  Procedure Laterality Date  . Cholecystectomy  03    laparoscopic  . Lumbar laminectomy/decompression microdiscectomy  09/10/2012    Procedure: LUMBAR LAMINECTOMY/DECOMPRESSION MICRODISCECTOMY 1 LEVEL;  Surgeon: Kristeen Miss, MD;  Location: [place] [facility];  Service: Neurosurgery;  Laterality: Left;  Left Lumbar Five-Sacral One Microdiscectomy  . Back surgery  2013  . Total hip arthroplasty Left 12/24/2014    Procedure: LEFT TOTAL HIP ARTHROPLASTY ANTERIOR APPROACH;  Surgeon: Mauri Pole,  MD;  Location: [facility];  Service: Orthopedics;  Laterality: Left;    There were no vitals filed for this visit.  Visit Diagnosis:  Lumbar back pain with radiculopathy affecting left lower extremity  Abnormality of gait  Weakness of left lower extremity  Pain in joint, pelvic region and thigh, left  Midline low back pain with sciatica, sciatica laterality unspecified  Weakness of left hip  Sensory disturbance  Lumbar radiculopathy  Posture abnormality      Subjective Assessment - 07/09/15 1330    Subjective doing good. very busy last night and really tired today. Left LE tingling today more after standing.    Pertinent History L THR (ant approach) 12/24/14, lumbar microsdiscectomy 08/2012   Limitations Sitting;Walking   How long can you sit comfortably? 15 min but must move around after 5 minutes.   How long can you stand comfortably? standing is better for her but when she starts walking the symptoms occur   Diagnostic tests none recently   Patient Stated Goals get my leg stronger, decrease likelihood of foot drop   Pain Location Hip   Pain Orientation Left   Pain Onset More than a month ago   Multiple Pain Sites No  [place] Adult PT Treatment/Exercise - 07/09/15 0001    Lumbar Exercises: Stretches   Active Hamstring Stretch 2 reps;60 seconds   Single Knee to Chest Stretch 5 reps   Double Knee to Chest Stretch 3 reps;10 seconds   Lower Trunk Rotation 2 reps   Pelvic Tilt 3 reps;10 seconds   Lumbar Exercises: Standing   Other Standing Lumbar Exercises Cat/Cow at wall   Other Standing Lumbar Exercises eft food on wedge, step fwd with riight for DF/c   Lumbar Exercises: Quadruped   Madcat/Old Horse 10 reps   Knee/Hip Exercises: Supine   Other Supine Knee/Hip Exercises tennis ball to piriformis   Manual Therapy   Manual Therapy Soft tissue mobilization   Soft tissue mobilization L/R hip   Myofascial Release elbow with ER/IR LE                  PT Education - 07/09/15 1407    Education provided Yes   Education Details weight shifting/ tennis ball to piriformis/ lifting and activating TrAbs/ Cat/Cow standing at wall   Person(s) Educated Patient   Methods Explanation;Demonstration   Comprehension Verbalized understanding;Returned demonstration          PT Short Term Goals - 07/08/15 1036    PT SHORT TERM GOAL #1   Title Pt will increase hamstring flexibility on the L LE to match the R to assist in normalizing gait pattern   Time 4   Period Weeks   Status Achieved   PT SHORT TERM GOAL #2   Title PT will report ability to sit for 74minutes without an increase in pain and numbness in order to enjoy her lunch break.   Baseline instructed by doctor to not sit long periods so she moves around   Time 4   Period Weeks   Status On-going   PT SHORT TERM GOAL #3   Title I with initial HEP   Time 2   Status Achieved   PT SHORT TERM GOAL #4   Title She will report tingling in lower leg and foot decr 25%    Time 3   Period Weeks   Status On-going           PT Long Term Goals - 06/29/15 1113    PT LONG TERM GOAL #1   Title pt will be I with advanced HEP   Time 8   Period Weeks   Status On-going   PT LONG TERM GOAL #2   Title Pt will report decreased back pain to 3/10 with normal daily activities (non-work)   Baseline In back but not thigh/hip   Period Weeks   Status On-going   PT LONG TERM GOAL #3   Title Pt will report improved ability to walk for extended periods (up an hour) with a min  increase in pain in order to perform work duties   Time 8   Period Weeks   Status On-going   PT LONG TERM GOAL #4   Title Pt will demo normalized gait pattern indicating decreased pain and increased strength in L LE.   Time 8   Status On-going   PT LONG TERM GOAL #5   Title Pt will demonstrate appropriate body mechanics when pushing, pulling, and lifting heavy objects in order to prevent injury in the  future   Time 8   Period Weeks   Status On-going               Plan - 07/09/15 1412  Clinical Impression Statement pt did very well with therapy today. She demonstrated flexed postion for some exercises. We worked on her relaxing shoulders and breathing techniques with all exercise to [employer] and [employer]. worked on [employer] shifting at work for taking pressure off left LE.   Rehab Potential Good   Clinical Impairments Affecting Rehab Potential Pt. progress may be limited by the patients long hours of sitting and standing without breaks and with less than optimal working conditions.   PT Frequency 2x / week   PT Duration 6 weeks   PT Treatment/Interventions Iontophoresis 4mg /ml Dexamethasone;Functional mobility training;Patient/family education;Passive range of motion;Therapeutic activities;Moist Heat;Therapeutic exercise;Dry needling;Taping;Balance training;Electrical Stimulation;Neuromuscular re-education;Manual techniques;Gait training   PT Next Visit Plan Back/Hip/Hams stretches, DF in Left ankle, Core   PT Home Exercise Plan piriformis variations. hamstring. cat/cow. childs pose   Consulted and Agree with Plan of Care Patient        Problem List Patient Active Problem List   Diagnosis Date Noted  . 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 Dizziness and giddiness 02/07/2013  . Cough 09/23/2011  . Hypertension 08/11/2011  . Obesity 08/11/2011     Natividad Brood, PTA  07/09/2015, 2:14 PM  [facility] [address] [place], [place], [address] Phone: [phone]   Fax:  [phone]

## 2015-07-13 ENCOUNTER — Telehealth: Payer: Self-pay | Admitting: Interventional Cardiology

## 2015-07-13 NOTE — Telephone Encounter (Signed)
New message     Pt needing to be referred to a pulmonologist  Pt is wanting to know if Dr Tamala Julian thinks Dr. Lamonte Sakai and Dr Gwenette Greet would be good choices

## 2015-07-13 NOTE — Telephone Encounter (Signed)
Called patient, asked about reason for pulmonary referral need.  Says she is concerned she may be having COPD symptoms since she is a former smoker.  Chest x-ray and PFT is normal.  Says she was seeing Dr Melvyn Novas, doesn't want to see him any longer.  Wants recommendation and referral to another pulmonologist.

## 2015-07-14 ENCOUNTER — Ambulatory Visit: Payer: PRIVATE HEALTH INSURANCE | Admitting: Physical Therapy

## 2015-07-14 DIAGNOSIS — R269 Unspecified abnormalities of gait and mobility: Secondary | ICD-10-CM

## 2015-07-14 DIAGNOSIS — M5417 Radiculopathy, lumbosacral region: Secondary | ICD-10-CM | POA: Diagnosis not present

## 2015-07-14 DIAGNOSIS — R293 Abnormal posture: Secondary | ICD-10-CM

## 2015-07-14 DIAGNOSIS — M5416 Radiculopathy, lumbar region: Secondary | ICD-10-CM

## 2015-07-14 DIAGNOSIS — R29898 Other symptoms and signs involving the musculoskeletal system: Secondary | ICD-10-CM

## 2015-07-14 DIAGNOSIS — M5440 Lumbago with sciatica, unspecified side: Secondary | ICD-10-CM

## 2015-07-14 DIAGNOSIS — M25552 Pain in left hip: Secondary | ICD-10-CM

## 2015-07-14 NOTE — Therapy (Signed)
[place], [place], [address] Phone: [phone]   Fax:  [phone]  Physical Therapy Treatment  Patient Details  Name: Nicole [name] MRN: [id number] Date of Birth: [birth year] Referring Provider:  Gaynelle Arabian, MD  Encounter Date: 07/14/2015      PT End of Session - 07/14/15 1319    Visit Number 8   Number of Visits 12   PT Start Time 1150   PT Stop Time 1240   PT Time Calculation (min) 50 min   Activity Tolerance Patient tolerated treatment well;No increased pain   Behavior During Therapy [facility] for tasks assessed/performed      Past Medical History  Diagnosis Date  . Complication of anesthesia   . PONV (postoperative nausea and vomiting)   . Family history of anesthesia complication     N&V- Mother   . Heart murmur     since birth  . Hypertension     followed by Dr. Marisue Humble   . H/O hiatal hernia     small hiatal hernia, has had endoscopy & colondoscopy  . GERD (gastroesophageal reflux disease)     Chron's disease, not medically treating currently   . Arthritis     lumbar- HNP, /w myelopathy  . Bronchitis     used albuterol , Sept. 2013, all clear now   . Asthma     /w reflux , consult /w Dr. Wert-2013 / USES INHALER PRN INFREQUENTLY  . Numbness in left leg   . Stress incontinence   . Difficulty sleeping     DUE TO PAIN  . Anxiety   . H/O vertigo     Past Surgical History  Procedure Laterality Date  . Cholecystectomy  03    laparoscopic  . Lumbar laminectomy/decompression microdiscectomy  09/10/2012    Procedure: LUMBAR LAMINECTOMY/DECOMPRESSION MICRODISCECTOMY 1 LEVEL;  Surgeon: Kristeen Miss, MD;  Location: [place] [facility];  Service: Neurosurgery;  Laterality: Left;  Left Lumbar Five-Sacral One Microdiscectomy  . Back surgery  2013  . Total hip arthroplasty Left 12/24/2014    Procedure: LEFT TOTAL HIP ARTHROPLASTY ANTERIOR APPROACH;  Surgeon: Mauri Pole, MD;  Location: [facility];   Service: Orthopedics;  Laterality: Left;    There were no vitals filed for this visit.  Visit Diagnosis:  Lumbar back pain with radiculopathy affecting left lower extremity  Abnormality of gait  Weakness of left lower extremity  Pain in joint, pelvic region and thigh, left  Midline low back pain with sciatica, sciatica laterality unspecified  Weakness of left hip  Lumbar radiculopathy  Posture abnormality      Subjective Assessment - 07/14/15 1150    Subjective (p) cramps  with knee flexion frequently and increasing frequency and is harder to get out.  .  She is worried about foot drop.  Tinging in foot is constant.  Manual lateral hip helpful.  Toes cul under if she sits and watches tv.  Finds herself dragging her fleft foot with increased frequency.  She has been doing cat/ camel and good posture .    Pain Score (p) 0-No pain  goes up to 10/10 at the end of work shift.   Pain Location (p) Hip   Pain Orientation (p) Left   Pain Descriptors / Indicators (p) Numbness;Cramping;Tightness   Aggravating Factors  (p) long    Pain Relieving Factors (p) manual  [place] Adult PT Treatment/Exercise - 07/14/15 1155    Lumbar Exercises: Prone   Other Prone Lumbar Exercises for home exercise over 2 pillows:  Single arm raise, single Straight leg raise and opposite arm, leg raise.   Other Prone Lumbar Exercises multifitus press pelvis, press with knee flexion 5 reps   Knee/Hip Exercises: Standing   Functional Squat --  showed how to modify for low reaching , elbow  rest on thigh   SLS 1   Other Standing Knee Exercises heel walking 15 feet 2  X   Knee/Hip Exercises: Sidelying   Hip ABduction Limitations Lt hip abduction 4/5   Manual Therapy   Manual Therapy Soft tissue mobilization   Soft tissue mobilization LT hip gluteal   Myofascial Release Rock tool to hip and followed by taping toActivate multifitus                 PT Education  - 07/14/15 1319    Education provided Yes   Education Details prone   Person(s) Educated Patient   Methods Explanation;Demonstration;Tactile cues;Verbal cues;Handout   Comprehension Verbalized understanding;Returned demonstration          PT Short Term Goals - 07/14/15 1322    PT SHORT TERM GOAL #1   Title Pt will increase hamstring flexibility on the L LE to match the R to assist in normalizing gait pattern   Status Achieved   PT SHORT TERM GOAL #2   Title PT will report ability to sit for 105minutes without an increase in pain and numbness in order to enjoy her lunch break.   Baseline cannot sit well   Time 4   Period Weeks   Status On-going   PT SHORT TERM GOAL #3   Title I with initial HEP   Status Achieved   PT SHORT TERM GOAL #4   Title She will report tingling in lower leg and foot decr 25%    Baseline no change   Time 3   Period Weeks   Status On-going           PT Long Term Goals - 06/29/15 1113    PT LONG TERM GOAL #1   Title pt will be I with advanced HEP   Time 8   Period Weeks   Status On-going   PT LONG TERM GOAL #2   Title Pt will report decreased back pain to 3/10 with normal daily activities (non-work)   Baseline In back but not thigh/hip   Period Weeks   Status On-going   PT LONG TERM GOAL #3   Title Pt will report improved ability to walk for extended periods (up an hour) with a min  increase in pain in order to perform work duties   Time 8   Period Weeks   Status On-going   PT LONG TERM GOAL #4   Title Pt will demo normalized gait pattern indicating decreased pain and increased strength in L LE.   Time 8   Status On-going   PT LONG TERM GOAL #5   Title Pt will demonstrate appropriate body mechanics when pushing, pulling, and lifting heavy objects in order to prevent injury in the future   Time 8   Period Weeks   Status On-going               Plan - 07/14/15 1320    Clinical Impression Statement Manual helpful.  Pain and  limping at end of work continue.. Progress toward home exercise goals.  PT Next Visit Plan Back/Hip/Hams stretches, DF in Left ankle, Core  prone strength review   PT Home Exercise Plan prone strengthening   Consulted and Agree with Plan of Care Patient        Problem List Patient Active Problem List   Diagnosis Date Noted  . 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 Dizziness and giddiness 02/07/2013  . Cough 09/23/2011  . Hypertension 08/11/2011  . Obesity 08/11/2011    HARRIS,KAREN 07/14/2015, 1:27 PM  [facility] [address] [place], [place], [address] Phone: [phone]   Fax:  [phone]     Melvenia Needles, PTA 07/14/2015 1:27 PM Phone: [phone] Fax: [phone]

## 2015-07-14 NOTE — Patient Instructions (Addendum)
Extension Lift   Keeping knee locked, tighten buttock muscles to raise sound limb 3____ inches. Hold ___0 to 5_ seconds. Repeat 10 to 30____ times. Do __1_ sessions per day.  Copyright  VHI. All rights reserved.  Straight Arm Lift: Prone   Arm straight out diagonally. Keeping thumb up, lift arm. Keep pelvis still. Do _10-30__ times1, ___ times per day.  http://ss.exer.us/311   Copyright  VHI. All rights reserved.  Arm / Leg Lift: Opposite (Prone)   Lift right leg and opposite arm _3___ inches from floor, keeping knee locked. Repeat _10- 30___ times per set. Do ____ sets per session. Do __1__ sessions per day. 1 http://orth.exer.us/101   Copyright  VHI. All rights reserved.    Call MD about increasing foot drag, and cramps.

## 2015-07-15 NOTE — Telephone Encounter (Signed)
She should probably discuss with her primary physician

## 2015-07-16 ENCOUNTER — Ambulatory Visit: Payer: PRIVATE HEALTH INSURANCE | Attending: Family Medicine | Admitting: Physical Therapy

## 2015-07-16 DIAGNOSIS — R269 Unspecified abnormalities of gait and mobility: Secondary | ICD-10-CM | POA: Diagnosis not present

## 2015-07-16 DIAGNOSIS — R29898 Other symptoms and signs involving the musculoskeletal system: Secondary | ICD-10-CM | POA: Diagnosis present

## 2015-07-16 DIAGNOSIS — M5417 Radiculopathy, lumbosacral region: Secondary | ICD-10-CM | POA: Insufficient documentation

## 2015-07-16 DIAGNOSIS — M5440 Lumbago with sciatica, unspecified side: Secondary | ICD-10-CM | POA: Insufficient documentation

## 2015-07-16 DIAGNOSIS — M25552 Pain in left hip: Secondary | ICD-10-CM | POA: Diagnosis present

## 2015-07-16 DIAGNOSIS — R293 Abnormal posture: Secondary | ICD-10-CM | POA: Insufficient documentation

## 2015-07-16 DIAGNOSIS — M5416 Radiculopathy, lumbar region: Secondary | ICD-10-CM | POA: Diagnosis present

## 2015-07-16 DIAGNOSIS — R208 Other disturbances of skin sensation: Secondary | ICD-10-CM | POA: Diagnosis present

## 2015-07-16 NOTE — Patient Instructions (Signed)
Inversion: Resisted   Cross legs with right leg underneath, foot in tubing loop. Hold tubing around other foot to resist and turn foot in. Repeat __10- 30__ times per set. Do __1__ sets per session. Do ___1_ sessions per day. DO sitting with knee flexed.  http://orth.exer.us/13   Copyright  VHI. All rights reserved.  Eversion: Resisted   With right foot in tubing loop, hold tubing around other foot to resist and turn foot out. Repeat __10 - 30__ times per set. Do __1__ sets per session. Do __1__ sessions per day.  http://orth.exer.us/15   Copyright  VHI. All rights reserved.   Also step ups.  Lt foot on step, move rt foot up and down 10 to 30 Patients.

## 2015-07-16 NOTE — Therapy (Signed)
[place], [place], [address] Phone: [phone]   Fax:  [phone]  Physical Therapy Treatment  Patient Details  Name: TEMPRENCE RHINES MRN: [id number] Date of Birth: 1956/08/01 Referring Provider:  Gaynelle Arabian, MD  Encounter Date: 07/16/2015      PT End of Session - 07/16/15 1521    Visit Number 9   Number of Visits 12   Date for PT Re-Evaluation 07/22/15   PT Start Time 2831   PT Stop Time 1420   PT Time Calculation (min) 47 min   Activity Tolerance Patient tolerated treatment well;No increased pain   Behavior During Therapy [facility] for tasks assessed/performed      Past Medical History  Diagnosis Date  . Complication of anesthesia   . PONV (postoperative nausea and vomiting)   . Family history of anesthesia complication     N&V- Mother   . Heart murmur     since birth  . Hypertension     followed by Dr. Marisue Humble   . H/O hiatal hernia     small hiatal hernia, has had endoscopy & colondoscopy  . GERD (gastroesophageal reflux disease)     Chron's disease, not medically treating currently   . Arthritis     lumbar- HNP, /w myelopathy  . Bronchitis     used albuterol , Sept. 2013, all clear now   . Asthma     /w reflux , consult /w Dr. Wert-2013 / USES INHALER PRN INFREQUENTLY  . Numbness in left leg   . Stress incontinence   . Difficulty sleeping     DUE TO PAIN  . Anxiety   . H/O vertigo     Past Surgical History  Procedure Laterality Date  . Cholecystectomy  03    laparoscopic  . Lumbar laminectomy/decompression microdiscectomy  09/10/2012    Procedure: LUMBAR LAMINECTOMY/DECOMPRESSION MICRODISCECTOMY 1 LEVEL;  Surgeon: Kristeen Miss, MD;  Location: [facility] [place] [facility];  Service: Neurosurgery;  Laterality: Left;  Left Lumbar Five-Sacral One Microdiscectomy  . Back surgery  2013  . Total hip arthroplasty Left 12/24/2014    Procedure: LEFT TOTAL HIP ARTHROPLASTY ANTERIOR APPROACH;  Surgeon:  Mauri Pole, MD;  Location: [facility];  Service: Orthopedics;  Laterality: Left;    There were no vitals filed for this visit.  Visit Diagnosis:  Abnormality of gait  Weakness of left lower extremity  Weakness of left hip      Subjective Assessment - 07/16/15 1507    Subjective I was a 10/10 last night at 7 pm.  Heavy non stop lifting.  I want to work on walking today.  My gait is off.  My nerve damage is permanent.  I am getting more cramps.     Currently in Pain? No/denies   Pain Score 0-No pain  up to 10/10   Pain Location Back   Pain Radiating Towards leg   Aggravating Factors  working, heavy lifting.    Pain Relieving Factors days off work   Multiple Pain Sites No                         [facility] Adult PT Treatment/Exercise - 07/16/15 1333    Ambulation/Gait   Gait Comments parallel bars, steps, mirror used.  We worked on foot position, weight shift, timing arm suing , posture.     Knee/Hip Exercises: Standing   Heel Raises 10 reps   Heel Raises Limitations toe walking 25  fet   Forward Step Up Left;3 sets;Hand Hold: 2;Step Height: 6"   Forward Step Up Limitations aded o home program   Ankle Exercises: Seated   Other Seated Ankle Exercises IV/EV with red band 10 +  reps each added to home exercise.                PT Education - 07/16/15 1520    Education provided Yes   Education Details gait training, ankle bands, step ups   Person(s) Educated Patient   Methods Explanation;Demonstration;Tactile cues;Verbal cues;Handout          PT Short Term Goals - 07/14/15 1322    PT SHORT TERM GOAL #1   Title Pt will increase hamstring flexibility on the L LE to match the R to assist in normalizing gait pattern   Status Achieved   PT SHORT TERM GOAL #2   Title PT will report ability to sit for 46minutes without an increase in pain and numbness in order to enjoy her lunch break.   Baseline cannot sit well   Time 4   Period Weeks   Status On-going    PT SHORT TERM GOAL #3   Title I with initial HEP   Status Achieved   PT SHORT TERM GOAL #4   Title She will report tingling in lower leg and foot decr 25%    Baseline no change   Time 3   Period Weeks   Status On-going           PT Long Term Goals - 06/29/15 1113    PT LONG TERM GOAL #1   Title pt will be I with advanced HEP   Time 8   Period Weeks   Status On-going   PT LONG TERM GOAL #2   Title Pt will report decreased back pain to 3/10 with normal daily activities (non-work)   Baseline In back but not thigh/hip   Period Weeks   Status On-going   PT LONG TERM GOAL #3   Title Pt will report improved ability to walk for extended periods (up an hour) with a min  increase in pain in order to perform work duties   Time 8   Period Weeks   Status On-going   PT LONG TERM GOAL #4   Title Pt will demo normalized gait pattern indicating decreased pain and increased strength in L LE.   Time 8   Status On-going   PT LONG TERM GOAL #5   Title Pt will demonstrate appropriate body mechanics when pushing, pulling, and lifting heavy objects in order to prevent injury in the future   Time 8   Period Weeks   Status On-going               Plan - 07/16/15 1522    Clinical Impression Statement Progress toward home exercise goals.  Gait and the componets were main focus.  Work activities still painful.   PT Next Visit Plan FOTO  10th visit.   PT Home Exercise Plan Ankle bands, step ups   Consulted and Agree with Plan of Care Patient        Problem List Patient Active Problem List   Diagnosis Date Noted  . 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 Dizziness and giddiness 02/07/2013  . Cough 09/23/2011  . Hypertension 08/11/2011  . Obesity 08/11/2011    Alondra Sahni 07/16/2015, 3:24 PM  [place], [place], [address] Phone: [phone]  Fax:  [place], PTA 07/16/2015 3:24  PM Phone: [phone] Fax: [phone]

## 2015-07-17 NOTE — Telephone Encounter (Signed)
Pt aware of Dr.Smith's response. She should probably discuss with her primary physician

## 2015-07-17 NOTE — Telephone Encounter (Signed)
Please take care of this.  

## 2015-07-21 ENCOUNTER — Encounter: Payer: Self-pay | Admitting: Physical Therapy

## 2015-07-22 ENCOUNTER — Ambulatory Visit: Payer: PRIVATE HEALTH INSURANCE | Admitting: Physical Therapy

## 2015-07-22 DIAGNOSIS — R269 Unspecified abnormalities of gait and mobility: Secondary | ICD-10-CM

## 2015-07-22 DIAGNOSIS — R29898 Other symptoms and signs involving the musculoskeletal system: Secondary | ICD-10-CM

## 2015-07-22 DIAGNOSIS — R209 Unspecified disturbances of skin sensation: Secondary | ICD-10-CM

## 2015-07-22 NOTE — Therapy (Signed)
[place], [place], [address] Phone: [phone]   Fax:  [phone]  Physical Therapy Treatment  Patient Details  Name: Nicole [name] MRN: [id number] Date of Birth: 1956/09/03 Referring Provider:  Gaynelle Arabian, MD  Encounter Date: 07/22/2015      PT End of Session - 07/22/15 1027    Visit Number 10   Number of Visits 12   Date for PT Re-Evaluation 07/22/15   PT Start Time 1027   PT Stop Time 1103   PT Time Calculation (min) 36 min   Activity Tolerance Patient tolerated treatment well   Behavior During Therapy [facility] for tasks assessed/performed      Past Medical History  Diagnosis Date  . Complication of anesthesia   . PONV (postoperative nausea and vomiting)   . Family history of anesthesia complication     N&V- Mother   . Heart murmur     since birth  . Hypertension     followed by Dr. Marisue Humble   . H/O hiatal hernia     small hiatal hernia, has had endoscopy & colondoscopy  . GERD (gastroesophageal reflux disease)     Chron's disease, not medically treating currently   . Arthritis     lumbar- HNP, /w myelopathy  . Bronchitis     used albuterol , Sept. 2013, all clear now   . Asthma     /w reflux , consult /w Dr. Wert-2013 / USES INHALER PRN INFREQUENTLY  . Numbness in left leg   . Stress incontinence   . Difficulty sleeping     DUE TO PAIN  . Anxiety   . H/O vertigo     Past Surgical History  Procedure Laterality Date  . Cholecystectomy  03    laparoscopic  . Lumbar laminectomy/decompression microdiscectomy  09/10/2012    Procedure: LUMBAR LAMINECTOMY/DECOMPRESSION MICRODISCECTOMY 1 LEVEL;  Surgeon: Kristeen Miss, MD;  Location: [place] [facility];  Service: Neurosurgery;  Laterality: Left;  Left Lumbar Five-Sacral One Microdiscectomy  . Back surgery  2013  . Total hip arthroplasty Left 12/24/2014    Procedure: LEFT TOTAL HIP ARTHROPLASTY ANTERIOR APPROACH;  Surgeon: Mauri Pole,  MD;  Location: [facility];  Service: Orthopedics;  Laterality: Left;    There were no vitals filed for this visit.  Visit Diagnosis:  Abnormality of gait  Weakness of left lower extremity  Weakness of left hip  Sensory disturbance      Subjective Assessment - 07/22/15 1030    Subjective I could hardly walk at work after last visit. Also three laps on track started getting sciatica (slightly over a mile). Patient reports increased numbness in left lateral plantar surface of foot and in heel.   Pertinent History L THR (ant approach) 12/24/14, lumbar microsdiscectomy 08/2012   Currently in Pain? No/denies            [facility] PT Assessment - 07/22/15 0001    Observation/Other Assessments   Focus on Therapeutic Outcomes (FOTO)  50% limited (intake 58%)                     [facility] Adult PT Treatment/Exercise - 07/22/15 0001    Lumbar Exercises: Standing   Heel Raises --   Lumbar Exercises: Supine   Other Supine Lumbar Exercises attempted nerve glides seated and supine; no positive tension noted.   Knee/Hip Exercises: Standing   Heel Raises 10 reps   Lateral Step Up 2 sets;10 reps;Hand Hold: 0;Step Height:  6";Step Height: 8"   Forward Step Up 2 sets;10 reps;Hand Hold: 0;Step Height: 8"   SLS multiple trials on LLE on level surface, greeen therapad and blue foam.                PT Education - 07/22/15 1244    Education provided Yes   Education Details resume SLS activities   Person(s) Educated Patient   Methods Explanation;Demonstration   Comprehension Verbalized understanding;Returned demonstration          PT Short Term Goals - 07/14/15 1322    PT SHORT TERM GOAL #1   Title Pt will increase hamstring flexibility on the L LE to match the R to assist in normalizing gait pattern   Status Achieved   PT SHORT TERM GOAL #2   Title PT will report ability to sit for 90minutes without an increase in pain and numbness in order to enjoy her lunch break.    Baseline cannot sit well   Time 4   Period Weeks   Status On-going   PT SHORT TERM GOAL #3   Title I with initial HEP   Status Achieved   PT SHORT TERM GOAL #4   Title She will report tingling in lower leg and foot decr 25%    Baseline no change   Time 3   Period Weeks   Status On-going           PT Long Term Goals - 06/29/15 1113    PT LONG TERM GOAL #1   Title pt will be I with advanced HEP   Time 8   Period Weeks   Status On-going   PT LONG TERM GOAL #2   Title Pt will report decreased back pain to 3/10 with normal daily activities (non-work)   Baseline In back but not thigh/hip   Period Weeks   Status On-going   PT LONG TERM GOAL #3   Title Pt will report improved ability to walk for extended periods (up an hour) with a min  increase in pain in order to perform work duties   Time 8   Period Weeks   Status On-going   PT LONG TERM GOAL #4   Title Pt will demo normalized gait pattern indicating decreased pain and increased strength in L LE.   Time 8   Status On-going   PT LONG TERM GOAL #5   Title Pt will demonstrate appropriate body mechanics when pushing, pulling, and lifting heavy objects in order to prevent injury in the future   Time 8   Period Weeks   Status On-going               Plan - 07/22/15 1245    Clinical Impression Statement Patient had significant increase in pain after last treatment, but denies pain today. She is still limited with ambulation due to return of sciatica sx. She reports increased numbnes in her left foot since last week as well, but continues to demo strong ankle DF and eversion with testing and functionally,  She had negative LLTT in sitting and supine. She continues to have balance deficits likely due to sensation problems.    PT Next Visit Plan continue proprioception, balance activities. Try elliptical, assess goals.        Problem List Patient Active Problem List   Diagnosis Date Noted  . S/P left THA, AA  12/24/2014  . Dizziness and giddiness 02/07/2013  . Cough 09/23/2011  . Hypertension 08/11/2011  . Obesity 08/11/2011  Madelyn Flavors PT  07/22/2015, 12:54 PM  [place] [facility] [address] [place], [place], [address] Phone: [phone]   Fax:  [phone]

## 2015-07-23 ENCOUNTER — Encounter: Payer: Self-pay | Admitting: Physical Therapy

## 2015-07-23 ENCOUNTER — Ambulatory Visit: Payer: PRIVATE HEALTH INSURANCE | Admitting: Physical Therapy

## 2015-07-23 DIAGNOSIS — M25552 Pain in left hip: Secondary | ICD-10-CM

## 2015-07-23 DIAGNOSIS — R269 Unspecified abnormalities of gait and mobility: Secondary | ICD-10-CM | POA: Diagnosis not present

## 2015-07-23 DIAGNOSIS — M5416 Radiculopathy, lumbar region: Secondary | ICD-10-CM

## 2015-07-23 DIAGNOSIS — R29898 Other symptoms and signs involving the musculoskeletal system: Secondary | ICD-10-CM

## 2015-07-23 NOTE — Patient Instructions (Signed)
Plank  Start on elbows and toes. Support body on hands and feet. Keep hips in line with torso and arms straight under chest. Avoid locking elbows. Hold for 10 seconds. Repeat 5 times.    Copyright  VHI. All rights reserved.   Side Plank  Modification: Do side plank on elbow. Bend the bottom knee to 90 degrees and raise up on elbow and knee.  From side, press up on one arm and side of foot. Extend other arm up. Hold for 10 seconds. Repeat on other side. Do 5 times each side.  Madelyn Flavors, PT 07/23/2015 11:00 AM [place], [place], [address] Phone: [phone]   Fax:  [phone]      Copyright  VHI. All rights reserved.

## 2015-07-23 NOTE — Therapy (Signed)
[place], [place], [address] Phone: [phone]   Fax:  [phone]  Physical Therapy Treatment  Patient Details  Name: Nicole [name] MRN: [id number] Date of Birth: [birth year] Referring Provider:  Gaynelle Arabian, MD  Encounter Date: 07/23/2015      PT End of Session - 07/23/15 1018    Visit Number 11   Number of Visits 12   Date for PT Re-Evaluation 07/22/15   PT Start Time 1018   PT Stop Time 1112   PT Time Calculation (min) 54 min   Activity Tolerance Patient tolerated treatment well   Behavior During Therapy [facility] for tasks assessed/performed      Past Medical History  Diagnosis Date  . Complication of anesthesia   . PONV (postoperative nausea and vomiting)   . Family history of anesthesia complication     N&V- Mother   . Heart murmur     since birth  . Hypertension     followed by Dr. Marisue Humble   . H/O hiatal hernia     small hiatal hernia, has had endoscopy & colondoscopy  . GERD (gastroesophageal reflux disease)     Chron's disease, not medically treating currently   . Arthritis     lumbar- HNP, /w myelopathy  . Bronchitis     used albuterol , Sept. 2013, all clear now   . Asthma     /w reflux , consult /w Dr. Wert-2013 / USES INHALER PRN INFREQUENTLY  . Numbness in left leg   . Stress incontinence   . Difficulty sleeping     DUE TO PAIN  . Anxiety   . H/O vertigo     Past Surgical History  Procedure Laterality Date  . Cholecystectomy  03    laparoscopic  . Lumbar laminectomy/decompression microdiscectomy  09/10/2012    Procedure: LUMBAR LAMINECTOMY/DECOMPRESSION MICRODISCECTOMY 1 LEVEL;  Surgeon: Kristeen Miss, MD;  Location: [place] [facility];  Service: Neurosurgery;  Laterality: Left;  Left Lumbar Five-Sacral One Microdiscectomy  . Back surgery  2013  . Total hip arthroplasty Left 12/24/2014    Procedure: LEFT TOTAL HIP ARTHROPLASTY ANTERIOR APPROACH;  Surgeon: Mauri Pole,  MD;  Location: [facility];  Service: Orthopedics;  Laterality: Left;    There were no vitals filed for this visit.  Visit Diagnosis:  Weakness of left lower extremity  Weakness of left hip  Lumbar back pain with radiculopathy affecting left lower extremity  Pain in joint, pelvic region and thigh, left  Lumbar radiculopathy      Subjective Assessment - 07/23/15 1019    Subjective "My back is killihg me from work. Last night I couldn't even lift my leg into the car. Last night it was a sharp nerve pain in the left sacrum, lower than usual"   Currently in Pain? Yes   Pain Score 4    Pain Location Back   Pain Orientation Left;Right;Lower   Pain Descriptors / Indicators Tightness                         [facility] Adult PT Treatment/Exercise - 07/23/15 0001    Lumbar Exercises: Aerobic   Elliptical 5 minutes   Lumbar Exercises: Sidelying   Other Sidelying Lumbar Exercises Side plank x 5 max hold bil   Lumbar Exercises: Quadruped   Straight Leg Raise 5 reps   Opposite Arm/Leg Raise 10 reps   Plank 5 max hold (about 5 seconds)   Knee/Hip  Exercises: Standing   Functional Squat 1 set;10 reps;Other (comment)  on Bosu; squat with opp hip ABD   Knee/Hip Exercises: Seated   Other Seated Knee/Hip Exercises seated sciatic nerve flossing L x 5  patient felt increased pull in left Lumbar   Modalities   Modalities Electrical Stimulation;Moist Heat   Moist Heat Therapy   Number Minutes Moist Heat 15 Minutes   Moist Heat Location Lumbar Spine   Electrical Stimulation   Electrical Stimulation Location Lumbar/sacrum   Electrical Stimulation Action IFC   Electrical Stimulation Parameters 80-150 Hz x 15 min   Electrical Stimulation Goals Pain                PT Education - 07/23/15 1104    Education provided Yes   Education Details plank, side plank   Person(s) Educated Patient   Methods Explanation;Demonstration;Verbal cues;Handout   Comprehension Returned  demonstration;Verbalized understanding          PT Short Term Goals - 07/23/15 1022    PT SHORT TERM GOAL #2   Title PT will report ability to sit for 71mnutes without an increase in pain and numbness in order to enjoy her lunch break.  5 minutes max   Period Weeks   Status On-going   PT SHORT TERM GOAL #4   Title She will report tingling in lower leg and foot decr 25%    Baseline no change   Time 3   Period Weeks   Status On-going           PT Long Term Goals - 07/23/15 1023    PT LONG TERM GOAL #2   Title Pt will report decreased back pain to 3/10 with normal daily activities (non-work)   Baseline tightens back up with vacuuming and mopping even with modifications   Time 8   Period Weeks   Status On-going   PT LONG TERM GOAL #3   Title Pt will report improved ability to walk for extended periods (up an hour) with a min  increase in pain in order to perform work duties   Baseline limited to 25 minutes (just over one mile)   Time 8   Status On-going               Plan - 07/23/15 1104    Clinical Impression Statement Patient continues to report increased symptoms after a shift at work. She has not met any LTGs and is not progressing with sitting tolerance at this time. She has weak core strength and would benefit progressing this in last two visits.   PT Next Visit Plan Prepare for d/c today or next visit. Progress core hep.        Problem List Patient Active Problem List   Diagnosis Date Noted  . 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 Dizziness and giddiness 02/07/2013  . Cough 09/23/2011  . Hypertension 08/11/2011  . Obesity 08/11/2011    JMadelyn FlavorsPT  07/23/2015, 4:51 PM  [facility] [address]Phone: [phone]  Fax:  [phone]

## 2015-07-29 ENCOUNTER — Ambulatory Visit: Payer: PRIVATE HEALTH INSURANCE | Admitting: Physical Therapy

## 2015-07-29 DIAGNOSIS — R269 Unspecified abnormalities of gait and mobility: Secondary | ICD-10-CM

## 2015-07-29 DIAGNOSIS — R293 Abnormal posture: Secondary | ICD-10-CM

## 2015-07-29 DIAGNOSIS — M5440 Lumbago with sciatica, unspecified side: Secondary | ICD-10-CM

## 2015-07-29 DIAGNOSIS — R29898 Other symptoms and signs involving the musculoskeletal system: Secondary | ICD-10-CM

## 2015-07-29 DIAGNOSIS — M5416 Radiculopathy, lumbar region: Secondary | ICD-10-CM

## 2015-07-29 DIAGNOSIS — R209 Unspecified disturbances of skin sensation: Secondary | ICD-10-CM

## 2015-07-29 DIAGNOSIS — M25552 Pain in left hip: Secondary | ICD-10-CM

## 2015-07-29 NOTE — Therapy (Signed)
[place], [place], [address] Phone: [phone]   Fax:  [phone]  Physical Therapy Treatment  Patient Details  Name: Nicole [name] MRN: [id number] Date of Birth: 10-17-1956 Referring Provider:  Gaynelle Arabian, MD  Encounter Date: 07/29/2015      PT End of Session - 07/29/15 1145    Visit Number 12   Number of Visits 24   Date for PT Re-Evaluation 09/16/15   PT Start Time 1017   PT Stop Time 1108   PT Time Calculation (min) 51 min   Activity Tolerance Patient tolerated treatment well   Behavior During Therapy [facility] for tasks assessed/performed      Past Medical History  Diagnosis Date  . Complication of anesthesia   . PONV (postoperative nausea and vomiting)   . Family history of anesthesia complication     N&V- Mother   . Heart murmur     since birth  . Hypertension     followed by Dr. Marisue Humble   . H/O hiatal hernia     small hiatal hernia, has had endoscopy & colondoscopy  . GERD (gastroesophageal reflux disease)     Chron's disease, not medically treating currently   . Arthritis     lumbar- HNP, /w myelopathy  . Bronchitis     used albuterol , Sept. 2013, all clear now   . Asthma     /w reflux , consult /w Dr. Wert-2013 / USES INHALER PRN INFREQUENTLY  . Numbness in left leg   . Stress incontinence   . Difficulty sleeping     DUE TO PAIN  . Anxiety   . H/O vertigo     Past Surgical History  Procedure Laterality Date  . Cholecystectomy  03    laparoscopic  . Lumbar laminectomy/decompression microdiscectomy  09/10/2012    Procedure: LUMBAR LAMINECTOMY/DECOMPRESSION MICRODISCECTOMY 1 LEVEL;  Surgeon: Kristeen Miss, MD;  Location: [place] [facility];  Service: Neurosurgery;  Laterality: Left;  Left Lumbar Five-Sacral One Microdiscectomy  . Back surgery  2013  . Total hip arthroplasty Left 12/24/2014    Procedure: LEFT TOTAL HIP ARTHROPLASTY ANTERIOR APPROACH;  Surgeon: Mauri Pole,  MD;  Location: [facility];  Service: Orthopedics;  Laterality: Left;    There were no vitals filed for this visit.  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 Pt sees doctor tomorrow for follow up. By 11pm at work " I am limping out the door".   Limitations Lifting;Walking;Sitting   How long can you sit comfortably? 58min   How long can you stand comfortably? >78min   How long can you walk comfortably? 1.57miles pain increased   Diagnostic tests none recently   Patient Stated Goals get stronger, decrease painat work   Currently in Pain? No/denies   Pain Score 0-No pain   Multiple Pain Sites Yes   Pain Score 4   Pain Location Buttocks   Pain Orientation Left   Pain Descriptors / Indicators Sharp;Pins and needles   Pain Type Acute pain   Pain Radiating Towards makes  foot tingle more   Pain Onset 1 to 4 weeks ago   Pain Frequency Intermittent   Aggravating Factors  pushing and lifting    Pain Relieving Factors laying down   Effect of Pain on Daily Activities increased pain with acitivity            [facility] PT Assessment - 07/29/15 1047    AROM   Lumbar Flexion 80 no pain   Lumbar Extension 30 no pain   Lumbar - Right Side Bend 25% NO PAIN   Lumbar - Left Side Bend 25% no pain   Lumbar - Right Rotation 35%   Lumbar - Left Rotation 35% with pain   Strength   Right Hip Flexion 5/5   Right Hip Extension 5/5   Right Hip ABduction 5/5   Left Hip Flexion 4+/5   Left Hip Extension 4-/5   Left Hip ABduction 4/5   Right Knee Flexion 5/5   Right Knee Extension  5/5   Left Knee Flexion 4/5   Left Knee Extension 4/5   Trendelenburg Test   Findings Negative   Side Right;Left                     [facility] Adult PT Treatment/Exercise - 07/29/15 0001    Self-Care   Self-Care Other Self-Care Comments   Other Self-Care Comments  educated about why she is now having more fot drop and difficulty walking up stairs, educated on anterior tibialis muscle and innervation (some signs of muscle atrophy anteriorly), how to strenthen that muscle   Lumbar Exercises: Aerobic   Elliptical 22min ( 3 min incoine 10, 2 min incline 1)   Lumbar Exercises: Prone   Other Prone Lumbar Exercises Quadraped resisted leg extensons (green band) given for HEP x15/side   Lumbar Exercises: Quadruped   Plank 30 sec, modified to quadraped knee raise off mat 3 sets x10 sec hold each due to low back pain , side plank tactile cues for body alignment 30sec hold                PT Education - 07/29/15 1144    Education provided Yes   Education Details POC/PT, HEP, muscle action and innervation how to strengthen (anterior tib)   Person(s) Educated Patient   Methods Explanation;Handout;Demonstration   Comprehension Verbalized understanding;Returned demonstration          PT Short Term Goals - 07/29/15 1146    PT SHORT TERM GOAL #1   Title Pt will increase hamstring flexibility on the L LE to match the R to assist in normalizing gait pattern   Status Achieved   PT SHORT TERM GOAL #2   Title PT will report ability to sit for 32minutes without an increase in pain and numbness in order to enjoy her lunch break.   Baseline 25min   Time 4   Period Weeks   Status On-going   PT SHORT TERM GOAL #3   Title I with initial HEP   Status Achieved   PT SHORT TERM GOAL #4   Title She will report tingling in lower leg and foot decr 25%    Baseline increases with acute buttock pain   Time 4   Period Weeks   Status On-going           PT Long Term Goals - 07/29/15  1147    PT LONG TERM GOAL #1   Title pt will be I with advanced HEP   Time 8  Period Weeks   Status On-going   PT LONG TERM GOAL #2   Title Pt will report decreased back pain to 3/10 with normal daily activities (non-work)   Time 8   Period Weeks   Status On-going   PT LONG TERM GOAL #3   Title Pt will report improved ability to walk for extended periods (up an hour) with a min  increase in pain in order to perform work duties   Baseline pain still increases wtih work can hardly walk by end of her shift   Time 8   Period Weeks   Status On-going   PT LONG TERM GOAL #4   Title Pt will demo normalized gait pattern indicating decreased pain and increased strength in L LE.   Baseline no guarded, longer time on Lt. LE in stance   Time 8   Period Weeks   Status Achieved   PT LONG TERM GOAL #5   Title Pt will demonstrate appropriate body mechanics when pushing, pulling, and lifting heavy objects in order to prevent injury in the future   Time 8   Period Weeks   Status Achieved               Plan - 07/29/15 1150    Clinical Impression Statement Session focus today on re-evaluating patient. She would like to continue therapy focusing more on pilates type exercises. Pt has achieved STG #1 and 3 and LTG # 4 and 5. Her strength in LE's have improved greatly and she no longer has a positive trendelenburg. Pt presents with increased pain upon walking and performing work related duties and decreased core strength.Pt would benefit from additional therapy visits to address her current deficits and utilize pilates equipment in the clinic to improve core strength.   Pt will benefit from skilled therapeutic intervention in order to improve on the following deficits Abnormal gait;Decreased range of motion;Difficulty walking;Pain;Decreased activity tolerance;Decreased balance;Improper body mechanics;Impaired flexibility;Decreased mobility;Decreased strength;Impaired sensation;Postural dysfunction    Rehab Potential Good   PT Frequency 2x / week   PT Duration 6 weeks   PT Treatment/Interventions Iontophoresis 4mg /ml Dexamethasone;Functional mobility training;Patient/family education;Passive range of motion;Therapeutic activities;Moist Heat;Therapeutic exercise;Dry needling;Taping;Balance training;Electrical Stimulation;Neuromuscular re-education;Manual techniques;Gait training   PT Next Visit Plan progress core stabilization, use foam roller, ask about doctor visit, begin Pilates with Danise Mina and [place] quadraped resisted leg extensions (green band), ankle dorsiflexion for anterior tib   Consulted and Agree with Plan of Care Patient       Asked for 12 more visits from [facility] today, will await approval before continuing PT.  Raeford Razor, PT 07/29/2015 12:55 PM Phone: [phone] Fax: [phone]  Problem List Patient Active Problem List   Diagnosis Date Noted  . 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 Dizziness and giddiness 02/07/2013  . Cough 09/23/2011  . Hypertension 08/11/2011  . Obesity 08/11/2011   Radonna Ricker, SPT  PAA,JENNIFER 07/29/2015, 12:55 PM  [facility] [address] [place], [place], [address] Phone: [phone]   Fax:  [phone]

## 2015-07-29 NOTE — Patient Instructions (Signed)
Quadraped resisted leg extension (green band) x15/side  Ankle dorsiflexion red or green band tie on to sturdy surface pull toe up to nose. 2x/dayx15

## 2015-07-30 ENCOUNTER — Encounter: Payer: Self-pay | Admitting: Physical Therapy

## 2015-07-31 ENCOUNTER — Ambulatory Visit: Payer: PRIVATE HEALTH INSURANCE | Admitting: Physical Therapy

## 2015-08-07 ENCOUNTER — Ambulatory Visit: Payer: PRIVATE HEALTH INSURANCE | Admitting: Physical Therapy

## 2015-08-07 DIAGNOSIS — M25552 Pain in left hip: Secondary | ICD-10-CM

## 2015-08-07 DIAGNOSIS — M5440 Lumbago with sciatica, unspecified side: Secondary | ICD-10-CM

## 2015-08-07 DIAGNOSIS — R269 Unspecified abnormalities of gait and mobility: Secondary | ICD-10-CM

## 2015-08-07 DIAGNOSIS — R209 Unspecified disturbances of skin sensation: Secondary | ICD-10-CM

## 2015-08-07 DIAGNOSIS — R29898 Other symptoms and signs involving the musculoskeletal system: Secondary | ICD-10-CM

## 2015-08-07 DIAGNOSIS — R293 Abnormal posture: Secondary | ICD-10-CM

## 2015-08-07 DIAGNOSIS — M5416 Radiculopathy, lumbar region: Secondary | ICD-10-CM

## 2015-08-07 NOTE — Therapy (Signed)
[place], [place], [address] Phone: [phone]   Fax:  [phone]  Physical Therapy Treatment  Patient Details  Name: Nicole [name] MRN: [id number] Date of Birth: 11-May-1956 Referring Provider:  Gaynelle Arabian, MD  Encounter Date: 08/07/2015      PT End of Session - 08/07/15 1128    Visit Number 13   Number of Visits 24   Date for PT Re-Evaluation 09/16/15   PT Start Time 1104   PT Stop Time 1155   PT Time Calculation (min) 51 min   Activity Tolerance Patient tolerated treatment well   Behavior During Therapy [facility] for tasks assessed/performed      Past Medical History  Diagnosis Date  . Complication of anesthesia   . PONV (postoperative nausea and vomiting)   . Family history of anesthesia complication     N&V- Mother   . Heart murmur     since birth  . Hypertension     followed by Dr. Marisue Humble   . H/O hiatal hernia     small hiatal hernia, has had endoscopy & colondoscopy  . GERD (gastroesophageal reflux disease)     Chron's disease, not medically treating currently   . Arthritis     lumbar- HNP, /w myelopathy  . Bronchitis     used albuterol , Sept. 2013, all clear now   . Asthma     /w reflux , consult /w Dr. Wert-2013 / USES INHALER PRN INFREQUENTLY  . Numbness in left leg   . Stress incontinence   . Difficulty sleeping     DUE TO PAIN  . Anxiety   . H/O vertigo     Past Surgical History  Procedure Laterality Date  . Cholecystectomy  03    laparoscopic  . Lumbar laminectomy/decompression microdiscectomy  09/10/2012    Procedure: LUMBAR LAMINECTOMY/DECOMPRESSION MICRODISCECTOMY 1 LEVEL;  Surgeon: Kristeen Miss, MD;  Location: [place] [facility];  Service: Neurosurgery;  Laterality: Left;  Left Lumbar Five-Sacral One Microdiscectomy  . Back surgery  2013  . Total hip arthroplasty Left 12/24/2014    Procedure: LEFT TOTAL HIP ARTHROPLASTY ANTERIOR APPROACH;  Surgeon: Mauri Pole,  MD;  Location: [facility];  Service: Orthopedics;  Laterality: Left;    There were no vitals filed for this visit.  Visit Diagnosis:  Weakness of left lower extremity  Posture abnormality  Weakness of left hip  Lumbar back pain with radiculopathy affecting left lower extremity  Pain in joint, pelvic region and thigh, left  Lumbar radiculopathy  Abnormality of gait  Sensory disturbance  Midline low back pain with sciatica, sciatica laterality unspecified      Subjective Assessment - 08/07/15 1124    Subjective Pt states her pain is the usual today 3/10.    Currently in Pain? Yes   Pain Score 3    Pain Location Back   Pain Orientation Right;Left;Lower   Pain Descriptors / Indicators Tightness;Aching   Pain Type Chronic pain   Pain Onset More than a month ago   Pain Frequency Constant   Aggravating Factors  work           [facility] Adult PT Treatment/Exercise - 08/07/15 1126    Lumbar Exercises: Aerobic   Stationary Bike 49minutes 30sec warmup       Pilates Reformer used for LE/core strength, postural strength, lumbopelvic disassociation and core control.  Exercises included: Footwork 2 Red 1 Blue parallel on heels and forefoot x 2- each  Heel stretching  and raises x 10 with running (gentle) Bridging all springs articulating x 8, cues to keep from pushing back with feet and needs cues for full hip  extension.  Supine Arm work 1 Red 1 Yellow Arcs x 15, cues to maintain tabletop Feet in Straps 1 Red 1 yellow Arcs and circles, paralell, decr coordination with circles.  Reverse Abdominals 1 Blue UE x 6, LE x 6 does not maintain head neck and shoulder alignment yet feels she needs more of a challenge.    Increased time today explaining the principles of Pilates and how controlling the resistance takes precedence over reps.  She will need reinforcement as she currently works with a trainer whose intention seems to be overall conditioning.  Used analogy of having a aethetically  pleasing biceps muscle vs an intact rotator cuff.    Gave patient handout to read about the history of Pilates and the benefits for Low back pain.   Quadruped leg pulls with Green Theraband x 10 each side      PT Short Term Goals - 08/07/15 1217    PT SHORT TERM GOAL #1   Title Pt will increase hamstring flexibility on the L LE to match the R to assist in normalizing gait pattern   Status Achieved   PT SHORT TERM GOAL #2   Title PT will report ability to sit for 66minutes without an increase in pain and numbness in order to enjoy her lunch break.   Status On-going   PT SHORT TERM GOAL #3   Title I with initial HEP   Status Achieved   PT SHORT TERM GOAL #4   Title She will report tingling in lower leg and foot decr 25%    Status On-going           PT Long Term Goals - 08/07/15 1218    PT LONG TERM GOAL #1   Title pt will be I with advanced HEP   Status On-going   PT LONG TERM GOAL #2   Title Pt will report decreased back pain to 3/10 with normal daily activities (non-work)   Status On-going   PT LONG TERM GOAL #3   Title Pt will report improved ability to walk for extended periods (up an hour) with a min  increase in pain in order to perform work duties   Status On-going   PT LONG TERM GOAL #4   Title Pt will demo normalized gait pattern indicating decreased pain and increased strength in L LE.   Status Achieved   PT LONG TERM GOAL #5   Title Pt will demonstrate appropriate body mechanics when pushing, pulling, and lifting heavy objects in order to prevent injury in the future   Status Achieved               Plan - 08/07/15 1129    Clinical Impression Statement Began Reformer training today. Will continue to benefit from PIlates based inteventions to ensure proper recruitment of deep spinal stabilizers and form with exercises.  Patient open to learning about what Pilates can offer her.    PT Next Visit Plan Pilates Reformer Bridging, Supine arms, legs and Abs,  Reverse Abd and OR foam roller/other mat ex with props   PT Home Exercise Plan quadraped resisted leg extensions (green band), ankle dorsiflexion for anterior tib   Consulted and Agree with Plan of Care Patient        Problem List Patient Active Problem List   Diagnosis Date Noted  .  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 Dizziness and giddiness 02/07/2013  . Cough 09/23/2011  . Hypertension 08/11/2011  . Obesity 08/11/2011    Eliot Popper 08/07/2015, 12:18 PM  [facility] [address] [place], [place], [address] Phone: [phone]   Fax:  [phone]

## 2015-08-10 ENCOUNTER — Ambulatory Visit: Payer: PRIVATE HEALTH INSURANCE | Admitting: Physical Therapy

## 2015-08-10 DIAGNOSIS — R269 Unspecified abnormalities of gait and mobility: Secondary | ICD-10-CM | POA: Diagnosis not present

## 2015-08-10 DIAGNOSIS — M5440 Lumbago with sciatica, unspecified side: Secondary | ICD-10-CM

## 2015-08-10 DIAGNOSIS — R29898 Other symptoms and signs involving the musculoskeletal system: Secondary | ICD-10-CM

## 2015-08-10 DIAGNOSIS — R293 Abnormal posture: Secondary | ICD-10-CM

## 2015-08-10 DIAGNOSIS — M25552 Pain in left hip: Secondary | ICD-10-CM

## 2015-08-10 DIAGNOSIS — R209 Unspecified disturbances of skin sensation: Secondary | ICD-10-CM

## 2015-08-10 DIAGNOSIS — M5416 Radiculopathy, lumbar region: Secondary | ICD-10-CM

## 2015-08-10 NOTE — Therapy (Signed)
[place], [place], [address] Phone: [phone]   Fax:  [phone]  Physical Therapy Treatment  Patient Details  Name: Nicole [name] MRN: [id number] Date of Birth: December 27, 1955 Referring Provider:  Gaynelle Arabian, MD  Encounter Date: 08/10/2015      PT End of Session - 08/10/15 1151    Visit Number 14   Number of Visits 24   Date for PT Re-Evaluation 09/16/15   PT Start Time 4196   PT Stop Time 1235   PT Time Calculation (min) 50 min      Past Medical History  Diagnosis Date  . Complication of anesthesia   . PONV (postoperative nausea and vomiting)   . Family history of anesthesia complication     N&V- Mother   . Heart murmur     since birth  . Hypertension     followed by Dr. Marisue Humble   . H/O hiatal hernia     small hiatal hernia, has had endoscopy & colondoscopy  . GERD (gastroesophageal reflux disease)     Chron's disease, not medically treating currently   . Arthritis     lumbar- HNP, /w myelopathy  . Bronchitis     used albuterol , Sept. 2013, all clear now   . Asthma     /w reflux , consult /w Dr. Wert-2013 / USES INHALER PRN INFREQUENTLY  . Numbness in left leg   . Stress incontinence   . Difficulty sleeping     DUE TO PAIN  . Anxiety   . H/O vertigo     Past Surgical History  Procedure Laterality Date  . Cholecystectomy  03    laparoscopic  . Lumbar laminectomy/decompression microdiscectomy  09/10/2012    Procedure: LUMBAR LAMINECTOMY/DECOMPRESSION MICRODISCECTOMY 1 LEVEL;  Surgeon: Kristeen Miss, MD;  Location: [place] [facility];  Service: Neurosurgery;  Laterality: Left;  Left Lumbar Five-Sacral One Microdiscectomy  . Back surgery  2013  . Total hip arthroplasty Left 12/24/2014    Procedure: LEFT TOTAL HIP ARTHROPLASTY ANTERIOR APPROACH;  Surgeon: Mauri Pole, MD;  Location: [facility];  Service: Orthopedics;  Laterality: Left;    There were no vitals filed for this  visit.  Visit Diagnosis:  Weakness of left lower extremity  Posture abnormality  Weakness of left hip  Lumbar back pain with radiculopathy affecting left lower extremity  Pain in joint, pelvic region and thigh, left  Lumbar radiculopathy  Abnormality of gait  Sensory disturbance  Midline low back pain with sciatica, sciatica laterality unspecified      Subjective Assessment - 08/10/15 1149    Subjective I have been hurting alot since last visit. Sciatica is flared up too. Left foot N/T    Currently in Pain? Yes   Pain Score 8    Pain Location Back   Pain Orientation Right;Lower   Pain Descriptors / Indicators Tightness                         [facility] Adult PT Treatment/Exercise - 08/10/15 0001    Lumbar Exercises: Aerobic   Stationary Bike 43minutes 30sec warmup NUSTEP   Lumbar Exercises: Supine   Other Supine Lumbar Exercises Reformer see progress note   Modalities   Modalities Electrical Stimulation;Moist Heat   Moist Heat Therapy   Number Minutes Moist Heat 10 Minutes   Moist Heat Location Lumbar Spine   Electrical Stimulation   Electrical Stimulation Location Lumbar   Electrical Stimulation Action  IFC   Electrical Stimulation Goals Pain      Pilates Reformer used for LE/core strength, postural strength, lumbopelvic disassociation and core control. Exercises included: Footwork 2 Red 1 Blue parallel on heels and forefoot x 10 each Supine arms: 1 red; Lat pulls x 10           PT Education - 08/10/15 1227    Education provided Yes   Education Details Prepilates   Person(s) Educated Patient   Methods Explanation;Handout   Comprehension Verbalized understanding          PT Short Term Goals - 08/07/15 1217    PT SHORT TERM GOAL #1   Title Pt will increase hamstring flexibility on the L LE to match the R to assist in normalizing gait pattern   Status Achieved   PT SHORT TERM GOAL #2   Title PT will report ability to sit for  71minutes without an increase in pain and numbness in order to enjoy her lunch break.   Status On-going   PT SHORT TERM GOAL #3   Title I with initial HEP   Status Achieved   PT SHORT TERM GOAL #4   Title She will report tingling in lower leg and foot decr 25%    Status On-going           PT Long Term Goals - 08/07/15 1218    PT LONG TERM GOAL #1   Title pt will be I with advanced HEP   Status On-going   PT LONG TERM GOAL #2   Title Pt will report decreased back pain to 3/10 with normal daily activities (non-work)   Status On-going   PT LONG TERM GOAL #3   Title Pt will report improved ability to walk for extended periods (up an hour) with a min  increase in pain in order to perform work duties   Status On-going   PT LONG TERM GOAL #4   Title Pt will demo normalized gait pattern indicating decreased pain and increased strength in L LE.   Status Achieved   PT LONG TERM GOAL #5   Title Pt will demonstrate appropriate body mechanics when pushing, pulling, and lifting heavy objects in order to prevent injury in the future   Status Achieved               Plan - 08/10/15 1209    Clinical Impression Statement  Low level reformer foot work and supine arms in table top with emphasis on core control. Instructed pt in Prepilates mat table exercises and added to HEP. Moderate cues required to maintain neutral spine.  Pt reports no increased pain aftter therex. Estim for painl today.    PT Next Visit Plan Pilates Reformer Bridging, Supine arms, legs and Abs, Reverse Abd and OR foam roller/other mat ex with props; REVIEW PREPILATES MAT HEP        Problem List Patient Active Problem List   Diagnosis Date Noted  . 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 Dizziness and giddiness 02/07/2013  . Cough 09/23/2011  . Hypertension 08/11/2011  . Obesity 08/11/2011    Dorene Ar, PTA 08/10/2015, 12:43 PM  [facility] [address] [place], [place], [address] Phone: [phone]   Fax:  [phone]

## 2015-08-10 NOTE — Patient Instructions (Signed)

## 2015-08-12 ENCOUNTER — Ambulatory Visit: Payer: PRIVATE HEALTH INSURANCE | Admitting: Physical Therapy

## 2015-08-12 DIAGNOSIS — R269 Unspecified abnormalities of gait and mobility: Secondary | ICD-10-CM | POA: Diagnosis not present

## 2015-08-12 DIAGNOSIS — M5416 Radiculopathy, lumbar region: Secondary | ICD-10-CM

## 2015-08-12 DIAGNOSIS — M25552 Pain in left hip: Secondary | ICD-10-CM

## 2015-08-12 DIAGNOSIS — R209 Unspecified disturbances of skin sensation: Secondary | ICD-10-CM

## 2015-08-12 DIAGNOSIS — R293 Abnormal posture: Secondary | ICD-10-CM

## 2015-08-12 DIAGNOSIS — R29898 Other symptoms and signs involving the musculoskeletal system: Secondary | ICD-10-CM

## 2015-08-12 DIAGNOSIS — M5440 Lumbago with sciatica, unspecified side: Secondary | ICD-10-CM

## 2015-08-12 NOTE — Therapy (Signed)
[place], [place], [address] Phone: [phone]   Fax:  [phone]  Physical Therapy Treatment  Patient Details  Name: Nicole [name] MRN: [id number] Date of Birth: 04-May-1956 Referring Provider:  Gaynelle Arabian, MD  Encounter Date: 08/12/2015      PT End of Session - 08/12/15 1303    Visit Number 15   Number of Visits 24   Date for PT Re-Evaluation 09/16/15   PT Start Time 1100   PT Stop Time 1142   PT Time Calculation (min) 42 min      Past Medical History  Diagnosis Date  . Complication of anesthesia   . PONV (postoperative nausea and vomiting)   . Family history of anesthesia complication     N&V- Mother   . Heart murmur     since birth  . Hypertension     followed by Dr. Marisue Humble   . H/O hiatal hernia     small hiatal hernia, has had endoscopy & colondoscopy  . GERD (gastroesophageal reflux disease)     Chron's disease, not medically treating currently   . Arthritis     lumbar- HNP, /w myelopathy  . Bronchitis     used albuterol , Sept. 2013, all clear now   . Asthma     /w reflux , consult /w Dr. Wert-2013 / USES INHALER PRN INFREQUENTLY  . Numbness in left leg   . Stress incontinence   . Difficulty sleeping     DUE TO PAIN  . Anxiety   . H/O vertigo     Past Surgical History  Procedure Laterality Date  . Cholecystectomy  03    laparoscopic  . Lumbar laminectomy/decompression microdiscectomy  09/10/2012    Procedure: LUMBAR LAMINECTOMY/DECOMPRESSION MICRODISCECTOMY 1 LEVEL;  Surgeon: Kristeen Miss, MD;  Location: [facility];  Service: Neurosurgery;  Laterality: Left;  Left Lumbar Five-Sacral One Microdiscectomy  . Back surgery  2013  . Total hip arthroplasty Left 12/24/2014    Procedure: LEFT TOTAL HIP ARTHROPLASTY ANTERIOR APPROACH;  Surgeon: Mauri Pole, MD;  Location: [facility];  Service: Orthopedics;  Laterality: Left;    There were no vitals filed for this  visit.  Visit Diagnosis:  Weakness of left lower extremity  Posture abnormality  Weakness of left hip  Lumbar back pain with radiculopathy affecting left lower extremity  Pain in joint, pelvic region and thigh, left  Lumbar radiculopathy  Abnormality of gait  Sensory disturbance  Midline low back pain with sciatica, sciatica laterality unspecified      Subjective Assessment - 08/12/15 1258    Subjective I have been hurting still. We need to take really easy. I have noticed increased incontinence, I am walking with my hip internally rotated per my co workers and I keep stubbing my toe on the left side.             [facility] PT Assessment - 08/12/15 1306    ROM / Strength   AROM / PROM / Strength Strength   Strength   Strength Assessment Site Ankle   Right/Left Hip Right;Left   Left Hip External Rotation 4-/5   Left Hip Internal Rotation 5/5   Right/Left Ankle Right;Left   Right Ankle Dorsiflexion 5/5   Left Ankle Dorsiflexion 4/5                     [facility] Adult PT Treatment/Exercise - 08/12/15 0001    Exercises   Exercises --  Seated hip ER with yellow band x 20 slow    Lumbar Exercises: Supine   Bent Knee Raise 10 reps   Bent Knee Raise Limitations bilateral with ball squeeze and crunch   Lumbar Exercises: Prone   Opposite Arm/Leg Raise Right arm/Left leg;Left arm/Right leg;10 reps   Other Prone Lumbar Exercises prone hip ER heel squueze x 10 with gtlut squeeze, also ER with yellow band x 20 slow   Other Prone Lumbar Exercises plank on elbows/knees 30 sec   Manual Therapy   Soft tissue mobilization Left hip glut muscles and along sacral border with active release Hip IR/ER                  PT Short Term Goals - 08/07/15 1217    PT SHORT TERM GOAL #1   Title Pt will increase hamstring flexibility on the L LE to match the R to assist in normalizing gait pattern   Status Achieved   PT SHORT TERM GOAL #2   Title PT will report ability to  sit for 63minutes without an increase in pain and numbness in order to enjoy her lunch break.   Status On-going   PT SHORT TERM GOAL #3   Title I with initial HEP   Status Achieved   PT SHORT TERM GOAL #4   Title She will report tingling in lower leg and foot decr 25%    Status On-going           PT Long Term Goals - 08/07/15 1218    PT LONG TERM GOAL #1   Title pt will be I with advanced HEP   Status On-going   PT LONG TERM GOAL #2   Title Pt will report decreased back pain to 3/10 with normal daily activities (non-work)   Status On-going   PT LONG TERM GOAL #3   Title Pt will report improved ability to walk for extended periods (up an hour) with a min  increase in pain in order to perform work duties   Status On-going   PT LONG TERM GOAL #4   Title Pt will demo normalized gait pattern indicating decreased pain and increased strength in L LE.   Status Achieved   PT LONG TERM GOAL #5   Title Pt will demonstrate appropriate body mechanics when pushing, pulling, and lifting heavy objects in order to prevent injury in the future   Status Achieved               Plan - 08/12/15 1259    Clinical Impression Statement Pt presents with increased reports of incontinence. Her co-workers have also noticed she is ambulating with her left hip in IR. She also notices increased episodes of stubbing her left toe. Pt was encouraged to contact her MD regarding the symptoms.    PT Next Visit Plan Pilates based mat exercises        Problem List Patient Active Problem List   Diagnosis Date Noted  . 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 Dizziness and giddiness 02/07/2013  . Cough 09/23/2011  . Hypertension 08/11/2011  . Obesity 08/11/2011    Dorene Ar, PTA 08/12/2015, 1:08 PM  [place], [place], [address] Phone: [phone]   Fax:  [phone]

## 2015-08-26 ENCOUNTER — Ambulatory Visit: Payer: PRIVATE HEALTH INSURANCE | Attending: Family Medicine | Admitting: Physical Therapy

## 2015-08-26 DIAGNOSIS — R293 Abnormal posture: Secondary | ICD-10-CM | POA: Diagnosis present

## 2015-08-26 DIAGNOSIS — M5417 Radiculopathy, lumbosacral region: Secondary | ICD-10-CM | POA: Insufficient documentation

## 2015-08-26 DIAGNOSIS — R208 Other disturbances of skin sensation: Secondary | ICD-10-CM | POA: Diagnosis present

## 2015-08-26 DIAGNOSIS — M5416 Radiculopathy, lumbar region: Secondary | ICD-10-CM

## 2015-08-26 DIAGNOSIS — R269 Unspecified abnormalities of gait and mobility: Secondary | ICD-10-CM | POA: Diagnosis present

## 2015-08-26 DIAGNOSIS — M5440 Lumbago with sciatica, unspecified side: Secondary | ICD-10-CM | POA: Diagnosis present

## 2015-08-26 DIAGNOSIS — M25552 Pain in left hip: Secondary | ICD-10-CM

## 2015-08-26 DIAGNOSIS — R29898 Other symptoms and signs involving the musculoskeletal system: Secondary | ICD-10-CM

## 2015-08-26 DIAGNOSIS — R209 Unspecified disturbances of skin sensation: Secondary | ICD-10-CM

## 2015-08-26 NOTE — Therapy (Signed)
[place], [place], [address] Phone: [phone]   Fax:  [phone]  Physical Therapy Treatment  Patient Details  Name: Nicole [name] MRN: [id number] Date of Birth: December 01, 1955 Referring Provider:  Gaynelle Arabian, MD  Encounter Date: 08/26/2015      PT End of Session - 08/26/15 1030    Visit Number 16   Number of Visits 24   Date for PT Re-Evaluation 09/16/15   PT Start Time 1020   PT Stop Time 1110   PT Time Calculation (min) 50 min   Activity Tolerance Patient tolerated treatment well   Behavior During Therapy [facility] for tasks assessed/performed      Past Medical History  Diagnosis Date  . Complication of anesthesia   . PONV (postoperative nausea and vomiting)   . Family history of anesthesia complication     N&V- Mother   . Heart murmur     since birth  . Hypertension     followed by Dr. Marisue Humble   . H/O hiatal hernia     small hiatal hernia, has had endoscopy & colondoscopy  . GERD (gastroesophageal reflux disease)     Chron's disease, not medically treating currently   . Arthritis     lumbar- HNP, /w myelopathy  . Bronchitis     used albuterol , Sept. 2013, all clear now   . Asthma     /w reflux , consult /w Dr. Wert-2013 / USES INHALER PRN INFREQUENTLY  . Numbness in left leg   . Stress incontinence   . Difficulty sleeping     DUE TO PAIN  . Anxiety   . H/O vertigo     Past Surgical History  Procedure Laterality Date  . Cholecystectomy  03    laparoscopic  . Lumbar laminectomy/decompression microdiscectomy  09/10/2012    Procedure: LUMBAR LAMINECTOMY/DECOMPRESSION MICRODISCECTOMY 1 LEVEL;  Surgeon: Kristeen Miss, MD;  Location: [facility] [place] [facility];  Service: Neurosurgery;  Laterality: Left;  Left Lumbar Five-Sacral One Microdiscectomy  . Back surgery  2013  . Total hip arthroplasty Left 12/24/2014    Procedure: LEFT TOTAL HIP ARTHROPLASTY ANTERIOR APPROACH;  Surgeon: Mauri Pole,  MD;  Location: [facility];  Service: Orthopedics;  Laterality: Left;    There were no vitals filed for this visit.  Visit Diagnosis:  Weakness of left lower extremity  Posture abnormality  Weakness of left hip  Lumbar back pain with radiculopathy affecting left lower extremity  Pain in joint, pelvic region and thigh, left  Lumbar radiculopathy  Abnormality of gait  Sensory disturbance  Midline low back pain with sciatica, sciatica laterality unspecified      Subjective Assessment - 08/26/15 1020    Subjective Patient has not seen MD.  Has another injection this Friday.  Reports worsening LLE inversion. "I keep stubbing my toe" and now my Rt. foot is tingling.    Currently in Pain? Yes   Pain Score 3    Pain Location Sacrum   Pain Orientation Lower   Pain Descriptors / Indicators Tingling;Burning   Pain Type Chronic pain   Pain Radiating Towards L and Rt. LE   Pain Onset More than a month ago   Pain Frequency Constant   Aggravating Factors  work   Pain Relieving Factors rest, stretching    Multiple Pain Sites --  Tingling L calf and Rt. foot, numb L heel             [facility] PT Assessment -  08/26/15 1038    Strength   Right Ankle Dorsiflexion 5/5   Left Ankle Dorsiflexion 4/5  GR. 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   Single Knee to Chest Stretch 2 reps;30 seconds   Prone on Elbows Stretch 5 reps   Press Ups 5 reps   Piriformis Stretch 2 reps;30 seconds   Piriformis Stretch Limitations supine knee over knee with rotation    Lumbar Exercises: Prone   Straight Leg Raise 10 reps   Straight Leg Raises Limitations x 10 knee bend with Tr A each LE   Other Prone Lumbar Exercises prone Tr A activation isometric x 10    Other Prone Lumbar  Exercises prone press up x 10 2 sets   Moist Heat Therapy   Number Minutes Moist Heat 10 Minutes   Moist Heat Location Lumbar Spine   Electrical Stimulation   Electrical Stimulation Location Lumbar   Electrical Stimulation Action IFC   Electrical Stimulation Parameters to tol   Electrical Stimulation Goals Pain                PT Education - 08/26/15 1241    Education provided Yes   Education Details extension exercises   Person(s) Educated Patient   Methods Explanation   Comprehension Verbalized understanding          PT Short Term Goals - 08/26/15 1028    PT SHORT TERM GOAL #1   Title Pt will increase hamstring flexibility on the L LE to match the R to assist in normalizing gait pattern   Status Achieved   PT SHORT TERM GOAL #2   Title PT will report ability to sit for 52minutes without an increase in pain and numbness in order to enjoy her lunch break.   Status On-going   PT SHORT TERM GOAL #3   Title I with initial HEP   Status Achieved   PT SHORT TERM GOAL #4   Title She will report tingling in lower leg and foot decr 25%    Status On-going           PT Long Term Goals - 08/26/15 1029    PT LONG TERM GOAL #1   Title pt will be I with advanced HEP   Status On-going   PT LONG TERM GOAL #2   Title Pt will report decreased back pain to 3/10 with normal daily activities (non-work)   Status On-going   PT LONG TERM GOAL #3   Title Pt will report improved ability to walk for extended periods (up an hour) with a min  increase in pain in order to perform work duties   Status On-going   PT LONG TERM GOAL #4   Title Pt will demo normalized gait pattern indicating decreased pain and increased strength in L LE.   Status On-going   PT LONG TERM GOAL #5   Title Pt will demonstrate appropriate body mechanics when pushing, pulling, and lifting heavy objects in order to prevent injury in the future   Status Achieved               Plan - 08/26/15 1032     Clinical Impression Statement Pt with increasing sensory  changes and weakness in L Gr toe extension. Also reported incontinence last visit.  Encouraged a discussion with MD about taking her out of work to allow healing.  Discomfort in low back after extension exercises but less LE symptoms.    PT Next Visit Plan focus on level 1 foundational stabilization.     PT Home Exercise Plan back down to extension and cont hip, trunk flexibility but go easy.    Consulted and Agree with Plan of Care Patient        Problem List Patient Active Problem List   Diagnosis Date Noted  . 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 Dizziness and giddiness 02/07/2013  . Cough 09/23/2011  . Hypertension 08/11/2011  . Obesity 08/11/2011    Montarius Kitagawa 08/26/2015, 12:47 PM  [facility] [address] [place], [place], [address] Phone: [phone]   Fax:  [phone]  Raeford Razor, PT 08/26/2015 12:47 PM Phone: [phone] Fax: [phone]

## 2015-08-26 NOTE — Patient Instructions (Signed)
Elbow Prop (Extension)   Prop body up on elbows for ____ seconds. Slowly lower it. Repeat ____ times. Do ____ sessions per day.  http://gt2.exer.us/243   Copyright  VHI. All rights reserved.  Back Hyperextension: Using Arms   Lying face down with arms bent, inhale. Then while exhaling, straighten arms. Hold __5-10__ seconds. Slowly return to starting position. Repeat _5-10___ times per set. Do __1-2__ sets per session. Do __2__ sessions per day.  OR  Extension   Lie face down, hands close to chest. Press trunk up, arching back. Keep neck long, shoulders down. Tighten buttocks to protect lower back. Hold __5-10__ seconds. Repeat __3-5__ times. Do __2__ sessions per day.   Copyright  VHI. All rights reserved.  Backward Bend (Standing)   Arch backward to make hollow of back deeper. Hold __5-10__ seconds. Repeat __3-5__ times per set. Do __1__ sets per session. Do ___2_ sessions per day.  http://orth.exer.us/178   Copyright  VHI. All rights reserved.

## 2015-08-27 ENCOUNTER — Ambulatory Visit: Payer: PRIVATE HEALTH INSURANCE | Admitting: Physical Therapy

## 2015-08-27 DIAGNOSIS — R209 Unspecified disturbances of skin sensation: Secondary | ICD-10-CM

## 2015-08-27 DIAGNOSIS — R269 Unspecified abnormalities of gait and mobility: Secondary | ICD-10-CM

## 2015-08-27 DIAGNOSIS — R29898 Other symptoms and signs involving the musculoskeletal system: Secondary | ICD-10-CM

## 2015-08-27 DIAGNOSIS — M25552 Pain in left hip: Secondary | ICD-10-CM

## 2015-08-27 DIAGNOSIS — M5440 Lumbago with sciatica, unspecified side: Secondary | ICD-10-CM

## 2015-08-27 DIAGNOSIS — M5416 Radiculopathy, lumbar region: Secondary | ICD-10-CM

## 2015-08-27 DIAGNOSIS — R293 Abnormal posture: Secondary | ICD-10-CM

## 2015-08-27 NOTE — Therapy (Signed)
[facility] [place], [place], [address] Phone: [phone]   Fax:  [phone]  Physical Therapy Treatment  Patient Details  Name: Nicole [name] MRN: [id number] Date of Birth: 30-Aug-1956 Referring Provider:  Gaynelle Arabian, MD  Encounter Date: 08/27/2015      PT End of Session - 08/27/15 1051    Visit Number 17   Number of Visits 24   Date for PT Re-Evaluation 09/16/15   PT Start Time 1751   PT Stop Time 1109   PT Time Calculation (min) 54 min   Activity Tolerance Patient tolerated treatment well   Behavior During Therapy [facility] for tasks assessed/performed      Past Medical History  Diagnosis Date  . Complication of anesthesia   . PONV (postoperative nausea and vomiting)   . Family history of anesthesia complication     N&V- Mother   . Heart murmur     since birth  . Hypertension     followed by Dr. Marisue Humble   . H/O hiatal hernia     small hiatal hernia, has had endoscopy & colondoscopy  . GERD (gastroesophageal reflux disease)     Chron's disease, not medically treating currently   . Arthritis     lumbar- HNP, /w myelopathy  . Bronchitis     used albuterol , Sept. 2013, all clear now   . Asthma     /w reflux , consult /w Dr. Wert-2013 / USES INHALER PRN INFREQUENTLY  . Numbness in left leg   . Stress incontinence   . Difficulty sleeping     DUE TO PAIN  . Anxiety   . H/O vertigo     Past Surgical History  Procedure Laterality Date  . Cholecystectomy  03    laparoscopic  . Lumbar laminectomy/decompression microdiscectomy  09/10/2012    Procedure: LUMBAR LAMINECTOMY/DECOMPRESSION MICRODISCECTOMY 1 LEVEL;  Surgeon: Kristeen Miss, MD;  Location: [place] [facility];  Service: Neurosurgery;  Laterality: Left;  Left Lumbar Five-Sacral One Microdiscectomy  . Back surgery  2013  . Total hip arthroplasty Left 12/24/2014    Procedure: LEFT TOTAL HIP ARTHROPLASTY ANTERIOR APPROACH;  Surgeon: Mauri Pole,  MD;  Location: [facility];  Service: Orthopedics;  Laterality: Left;    There were no vitals filed for this visit.  Visit Diagnosis:  Weakness of left lower extremity  Posture abnormality  Weakness of left hip  Lumbar back pain with radiculopathy affecting left lower extremity  Pain in joint, pelvic region and thigh, left  Lumbar radiculopathy  Abnormality of gait  Sensory disturbance  Midline low back pain with sciatica, sciatica laterality unspecified      Subjective Assessment - 08/27/15 1019    Subjective Sciatic pain is worse today, cramping started last night.     Currently in Pain? Yes   Pain Score 3   last night 7/10-8/10 took a Vicodin   Pain Location Back   Pain Orientation Lower   Pain Type Chronic pain   Pain Radiating Towards LLE   Pain Onset More than a month ago   Pain Frequency Constant   Aggravating Factors  lately exercises are making it worse   Pain Relieving Factors meds, rest, TENS   Multiple Pain Sites No            [facility] PT Assessment - 08/26/15 1038    Strength   Right Ankle Dorsiflexion 5/5   Left Ankle Dorsiflexion 4/5  GR. TOE ext 3/5  [facility] Adult PT Treatment/Exercise - 08/27/15 1056    Electrical Stimulation   Electrical Stimulation Location Lumbar L hip   Electrical Stimulation Action IFC   Electrical Stimulation Parameters 13   Electrical Stimulation Goals Pain   Ultrasound   Ultrasound Location L piriformis and left of coccyx   Ultrasound Parameters 20%, 15. w/cm2 and ! Mhz   Ultrasound Goals Pain   Manual Therapy   Manual Therapy Soft tissue mobilization;Myofascial release;Passive ROM   Manual therapy comments mod to deep pressure   Soft tissue mobilization piriformis, gluteal and lumbar   Myofascial Release Lumbar parapsinals   Passive ROM L hip ER/IR with compresion to piriformis                PT Education - 08/27/15 1051    Education provided No          PT Short Term Goals -  08/26/15 1028    PT SHORT TERM GOAL #1   Title Pt will increase hamstring flexibility on the L LE to match the R to assist in normalizing gait pattern   Status Achieved   PT SHORT TERM GOAL #2   Title PT will report ability to sit for 36minutes without an increase in pain and numbness in order to enjoy her lunch break.   Status On-going   PT SHORT TERM GOAL #3   Title I with initial HEP   Status Achieved   PT SHORT TERM GOAL #4   Title She will report tingling in lower leg and foot decr 25%    Status On-going           PT Long Term Goals - 08/26/15 1029    PT LONG TERM GOAL #1   Title pt will be I with advanced HEP   Status On-going   PT LONG TERM GOAL #2   Title Pt will report decreased back pain to 3/10 with normal daily activities (non-work)   Status On-going   PT LONG TERM GOAL #3   Title Pt will report improved ability to walk for extended periods (up an hour) with a min  increase in pain in order to perform work duties   Status On-going   PT LONG TERM GOAL #4   Title Pt will demo normalized gait pattern indicating decreased pain and increased strength in L LE.   Status On-going   PT LONG TERM GOAL #5   Title Pt will demonstrate appropriate body mechanics when pushing, pulling, and lifting heavy objects in order to prevent injury in the future   Status Achieved         OK FOR TRACTION?      Plan - 08/27/15 1052    Clinical Impression Statement Patient with worsening sx since yesterday even with basic exercises.  Modalities today. Please advise if traction would be indicated for her current sx.  Also pt. wondered about resuming PT after spinal injection Friday.  She has appt Mon and Wed of next week.    PT Next Visit Plan see what MD said    PT Home Exercise Plan back down to extension and cont hip, trunk flexibility but go easy.    Consulted and Agree with Plan of Care Patient        Problem List Patient Active Problem List   Diagnosis Date Noted  . S/P  left THA, AA 12/24/2014  . Dizziness and giddiness 02/07/2013  . Cough 09/23/2011  . Hypertension 08/11/2011  . Obesity 08/11/2011  PAA,JENNIFER 08/27/2015, 10:58 AM  [facility] [address] [place], [place], [address] Phone: [phone]   Fax:  [phone]    Raeford Razor, PT 08/27/2015 10:59 AM Phone: [phone] Fax: [phone]

## 2015-08-31 ENCOUNTER — Ambulatory Visit: Payer: PRIVATE HEALTH INSURANCE | Admitting: Physical Therapy

## 2015-09-02 ENCOUNTER — Encounter: Payer: Self-pay | Admitting: Physical Therapy

## 2015-09-02 ENCOUNTER — Ambulatory Visit: Payer: PRIVATE HEALTH INSURANCE | Admitting: Physical Therapy

## 2015-09-02 DIAGNOSIS — M5416 Radiculopathy, lumbar region: Secondary | ICD-10-CM

## 2015-09-02 DIAGNOSIS — R293 Abnormal posture: Secondary | ICD-10-CM

## 2015-09-02 DIAGNOSIS — R209 Unspecified disturbances of skin sensation: Secondary | ICD-10-CM

## 2015-09-02 DIAGNOSIS — R29898 Other symptoms and signs involving the musculoskeletal system: Secondary | ICD-10-CM | POA: Diagnosis not present

## 2015-09-02 DIAGNOSIS — R269 Unspecified abnormalities of gait and mobility: Secondary | ICD-10-CM

## 2015-09-02 DIAGNOSIS — M5440 Lumbago with sciatica, unspecified side: Secondary | ICD-10-CM

## 2015-09-02 DIAGNOSIS — M25552 Pain in left hip: Secondary | ICD-10-CM

## 2015-09-02 NOTE — Therapy (Signed)
[place], [place], [address] Phone: [phone]   Fax:  [phone]  Physical Therapy Treatment  Patient Details  Name: Nicole [name] MRN: [id number] Date of Birth: 11-Aug-1956 No Data Recorded  Encounter Date: 09/02/2015      PT End of Session - 09/02/15 1223    Visit Number 18   Number of Visits 24   Date for PT Re-Evaluation 09/16/15   PT Start Time 1142   PT Stop Time 1220   PT Time Calculation (min) 38 min      Past Medical History  Diagnosis Date  . Complication of anesthesia   . PONV (postoperative nausea and vomiting)   . Family history of anesthesia complication     N&V- Mother   . Heart murmur     since birth  . Hypertension     followed by Dr. Marisue Humble   . H/O hiatal hernia     small hiatal hernia, has had endoscopy & colondoscopy  . GERD (gastroesophageal reflux disease)     Chron's disease, not medically treating currently   . Arthritis     lumbar- HNP, /w myelopathy  . Bronchitis     used albuterol , Sept. 2013, all clear now   . Asthma     /w reflux , consult /w Dr. Wert-2013 / USES INHALER PRN INFREQUENTLY  . Numbness in left leg   . Stress incontinence   . Difficulty sleeping     DUE TO PAIN  . Anxiety   . H/O vertigo     Past Surgical History  Procedure Laterality Date  . Cholecystectomy  03    laparoscopic  . Lumbar laminectomy/decompression microdiscectomy  09/10/2012    Procedure: LUMBAR LAMINECTOMY/DECOMPRESSION MICRODISCECTOMY 1 LEVEL;  Surgeon: Kristeen Miss, MD;  Location: [place] [facility];  Service: Neurosurgery;  Laterality: Left;  Left Lumbar Five-Sacral One Microdiscectomy  . Back surgery  2013  . Total hip arthroplasty Left 12/24/2014    Procedure: LEFT TOTAL HIP ARTHROPLASTY ANTERIOR APPROACH;  Surgeon: Mauri Pole, MD;  Location: [facility];  Service: Orthopedics;  Laterality: Left;    There were no vitals filed for this visit.  Visit Diagnosis:   Weakness of left lower extremity  Posture abnormality  Lumbar back pain with radiculopathy affecting left lower extremity  Pain in joint, pelvic region and thigh, left  Lumbar radiculopathy  Abnormality of gait  Sensory disturbance  Midline low back pain with sciatica, sciatica laterality unspecified      Subjective Assessment - 09/02/15 1258    Subjective Quadruped exercises aggravated my pain. The injections helped. No pain for a short period.    Currently in Pain? Yes   Pain Score --  unable to rate   Pain Location --  tail bone   Pain Descriptors / Indicators Burning   Aggravating Factors  bending over   Pain Relieving Factors meds rest                         [facility] Adult PT Treatment/Exercise - 09/02/15 0001    Self-Care   Self-Care Lifting   Lifting 10 knee to waist as well and turning verses twisting, holding close to body, long arms. lifting linen bags into cart, unloading bins- OR work sim activities   Lumbar Exercises: Stretches   Prone on Elbows Stretch 1 rep;60 seconds   Lumbar Exercises: Seated   Hip Flexion on Ball 20 reps   Hip  Flexion on Ball Limitations as well and LAQ, toe taps, seated dead bug all from narrow and wide base, pelvic rocking avoiding flexion x 10- focus on neutral spine and ab brace- performed on sit fit and 55 cm physioball   Knee/Hip Exercises: Standing   Other Standing Knee Exercises hip hinge x 5                  PT Short Term Goals - 08/26/15 1028    PT SHORT TERM GOAL #1   Title Pt will increase hamstring flexibility on the L LE to match the R to assist in normalizing gait pattern   Status Achieved   PT SHORT TERM GOAL #2   Title PT will report ability to sit for 35minutes without an increase in pain and numbness in order to enjoy her lunch break.   Status On-going   PT SHORT TERM GOAL #3   Title I with initial HEP   Status Achieved   PT SHORT TERM GOAL #4   Title She will report tingling in lower  leg and foot decr 25%    Status On-going           PT Long Term Goals - 08/26/15 1029    PT LONG TERM GOAL #1   Title pt will be I with advanced HEP   Status On-going   PT LONG TERM GOAL #2   Title Pt will report decreased back pain to 3/10 with normal daily activities (non-work)   Status On-going   PT LONG TERM GOAL #3   Title Pt will report improved ability to walk for extended periods (up an hour) with a min  increase in pain in order to perform work duties   Status On-going   PT LONG TERM GOAL #4   Title Pt will demo normalized gait pattern indicating decreased pain and increased strength in L LE.   Status On-going   PT LONG TERM GOAL #5   Title Pt will demonstrate appropriate body mechanics when pushing, pulling, and lifting heavy objects in order to prevent injury in the future   Status Achieved               Plan - 09/02/15 1224    Clinical Impression Statement Pt reports being pain free after her injection until she unloaded the dishwasher. She has less hip and buttock pain however continued burning around distal sacrum. She is concerned about her sitting endurance and notes increased pain now with driving that was not bothering her before. Instructed pt in seated sit fit and 55cm ball pelvic rocking neutral to extension as well as neutral ab brace with LE /UE movements. Body mechanics reviewed with patient  especially hip hinging, kneeling, and lifting. Pt brought in her physioball and recommended she get a 55cm ball for home use.    Rehab Potential --   PT Next Visit Plan review seated ball exercises, Go over full body mechanics handout        Problem List Patient Active Problem List   Diagnosis Date Noted  . 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 Dizziness and giddiness 02/07/2013  . Cough 09/23/2011  . Hypertension 08/11/2011  . Obesity 08/11/2011    Dorene Ar, PTA 09/02/2015, 1:07 PM  [facility] [address] [place], [place], [address] Phone: [phone]   Fax:  [phone]  Name: Nicole [name] MRN: [id number] Date of Birth: [birth year]

## 2015-09-03 ENCOUNTER — Ambulatory Visit: Payer: PRIVATE HEALTH INSURANCE | Admitting: Physical Therapy

## 2015-09-03 DIAGNOSIS — R29898 Other symptoms and signs involving the musculoskeletal system: Secondary | ICD-10-CM

## 2015-09-03 DIAGNOSIS — R269 Unspecified abnormalities of gait and mobility: Secondary | ICD-10-CM

## 2015-09-03 DIAGNOSIS — R293 Abnormal posture: Secondary | ICD-10-CM

## 2015-09-03 DIAGNOSIS — M5416 Radiculopathy, lumbar region: Secondary | ICD-10-CM

## 2015-09-03 DIAGNOSIS — M25552 Pain in left hip: Secondary | ICD-10-CM

## 2015-09-03 DIAGNOSIS — M5440 Lumbago with sciatica, unspecified side: Secondary | ICD-10-CM

## 2015-09-03 NOTE — Therapy (Signed)
[place], [place], [address] Phone: [phone]   Fax:  [phone]  Physical Therapy Treatment  Patient Details  Name: Nicole [name] MRN: [id number] Date of Birth: 06-17-1956 No Data Recorded  Encounter Date: 09/03/2015      PT End of Session - 09/03/15 1215    Visit Number 19   Number of Visits 24   Date for PT Re-Evaluation 09/16/15   PT Start Time 1105   PT Stop Time 1150   PT Time Calculation (min) 45 min   Activity Tolerance Patient tolerated treatment well;No increased pain   Behavior During Therapy [facility] for tasks assessed/performed      Past Medical History  Diagnosis Date  . Complication of anesthesia   . PONV (postoperative nausea and vomiting)   . Family history of anesthesia complication     N&V- Mother   . Heart murmur     since birth  . Hypertension     followed by Dr. Marisue Humble   . H/O hiatal hernia     small hiatal hernia, has had endoscopy & colondoscopy  . GERD (gastroesophageal reflux disease)     Chron's disease, not medically treating currently   . Arthritis     lumbar- HNP, /w myelopathy  . Bronchitis     used albuterol , Sept. 2013, all clear now   . Asthma     /w reflux , consult /w Dr. Wert-2013 / USES INHALER PRN INFREQUENTLY  . Numbness in left leg   . Stress incontinence   . Difficulty sleeping     DUE TO PAIN  . Anxiety   . H/O vertigo     Past Surgical History  Procedure Laterality Date  . Cholecystectomy  03    laparoscopic  . Lumbar laminectomy/decompression microdiscectomy  09/10/2012    Procedure: LUMBAR LAMINECTOMY/DECOMPRESSION MICRODISCECTOMY 1 LEVEL;  Surgeon: Kristeen Miss, MD;  Location: [place] [facility];  Service: Neurosurgery;  Laterality: Left;  Left Lumbar Five-Sacral One Microdiscectomy  . Back surgery  2013  . Total hip arthroplasty Left 12/24/2014    Procedure: LEFT TOTAL HIP ARTHROPLASTY ANTERIOR APPROACH;  Surgeon: Mauri Pole, MD;   Location: [facility];  Service: Orthopedics;  Laterality: Left;    There were no vitals filed for this visit.  Visit Diagnosis:  Weakness of left lower extremity  Posture abnormality  Lumbar back pain with radiculopathy affecting left lower extremity  Pain in joint, pelvic region and thigh, left  Lumbar radiculopathy  Abnormality of gait  Midline low back pain with sciatica, sciatica laterality unspecified  Weakness of left hip      Subjective Assessment - 09/03/15 1201    Pain Score --  mild   Pain Location Back   Pain Orientation Left;Right   Pain Descriptors / Indicators Tingling;Burning   Pain Radiating Towards both legs   Pain Frequency Constant   Aggravating Factors  ADL, work activities   Pain Relieving Factors meds, rest                         [facility] Adult PT Treatment/Exercise - 09/03/15 1105    Self-Care   Lifting AD   Other Self-Care Comments  multiple ADL simulated, practiced, overhead, dishwasher, vacume, mop squats , lifting good techniques observed    Lumbar Exercises: Aerobic   Elliptical 5 minutes, ramp 1 level 1   Lumbar Exercises: Standing   Other Standing Lumbar Exercises hip hinge  to  wall   Other Standing Lumbar Exercises ball lift practice from floor ,   from mat height to overhead.                  PT Education - 09/03/15 1215    Education provided Yes   Education Details exercises from drawer for [place], ADL practice   Person(s) Educated Patient   Methods Explanation;Demonstration;Tactile cues;Verbal cues;Handout   Comprehension Verbalized understanding;Returned demonstration          PT Short Term Goals - 09/03/15 1218    PT SHORT TERM GOAL #1   Title Pt will increase hamstring flexibility on the L LE to match the R to assist in normalizing gait pattern   PT SHORT TERM GOAL #2   Title PT will report ability to sit for 75mnutes without an increase in pain and numbness in order to enjoy her lunch break.   Time  4   Period Weeks   Status On-going   PT SHORT TERM GOAL #4   Title She will report tingling in lower leg and foot decr 25%    Time 4   Period Weeks   Status On-going           PT Long Term Goals - 09/03/15 1222    PT LONG TERM GOAL #2   Title Pt will report decreased back pain to 3/10 with normal daily activities (non-work)   Baseline will try new techniques today   Time 8   Period Weeks   Status On-going   PT LONG TERM GOAL #3   Title Pt will report improved ability to walk for extended periods (up an hour) with a min  increase in pain in order to perform work duties   Time 8   Period Weeks   Status On-going   PT LONG TERM GOAL #4   Title Pt will demo normalized gait pattern indicating decreased pain and increased strength in L LE.   Baseline improved short diststances in clinic   Time 8   Period Weeks   Status On-going   PT LONG TERM GOAL #5   Title Pt will demonstrate appropriate body mechanics when pushing, pulling, and lifting heavy objects in order to prevent injury in the future   Time 8   Period Weeks   Status Achieved               Plan - 09/03/15 1105    Clinical Impression Statement tingling both legs, some leg pain.  worse wit sitting.  Prone exercises irritating, made her back tighten.  Sitting exercises  helpful. Sitting in general irritating.  Tingling constant.  Shot did not help tingling.  Intensity less today.  Goal met for ADL posture and body mechanics and home  exercise progression   PT Next Visit Plan continue ball exercises   PT Home Exercise Plan ball   Consulted and Agree with Plan of Care Patient     POC continues into November.  Patient is not sure Worker Compensation has approved more visits.  E-mail [email or web address] the FOTO if this is her last visit   Problem List Patient Active Problem List   Diagnosis Date Noted  . 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 Dizziness and giddiness 02/07/2013  . Cough 09/23/2011  . Hypertension  08/11/2011  . Obesity 08/11/2011    , 09/03/2015, 12:29 PM  [facility] [address]Phone: [phone]  Fax:  [phone]  Name: Nicole [name] MRN: [id number] Date of Birth: 1956-07-09    Melvenia Needles, PTA 09/03/2015 12:29 PM Phone: [phone] Fax: [phone]

## 2015-09-09 ENCOUNTER — Ambulatory Visit: Payer: PRIVATE HEALTH INSURANCE | Admitting: Physical Therapy

## 2015-09-09 ENCOUNTER — Encounter: Payer: Self-pay | Admitting: Physical Therapy

## 2015-09-09 DIAGNOSIS — M25552 Pain in left hip: Secondary | ICD-10-CM

## 2015-09-09 DIAGNOSIS — M5440 Lumbago with sciatica, unspecified side: Secondary | ICD-10-CM

## 2015-09-09 DIAGNOSIS — R29898 Other symptoms and signs involving the musculoskeletal system: Secondary | ICD-10-CM | POA: Diagnosis not present

## 2015-09-09 DIAGNOSIS — R269 Unspecified abnormalities of gait and mobility: Secondary | ICD-10-CM

## 2015-09-09 DIAGNOSIS — M5416 Radiculopathy, lumbar region: Secondary | ICD-10-CM

## 2015-09-09 DIAGNOSIS — R293 Abnormal posture: Secondary | ICD-10-CM

## 2015-09-09 DIAGNOSIS — R209 Unspecified disturbances of skin sensation: Secondary | ICD-10-CM

## 2015-09-09 NOTE — Therapy (Signed)
[place], [place], [address] Phone: [phone]   Fax:  [phone]  Physical Therapy Treatment  Patient Details  Name: Nicole [name] MRN: [id number] Date of Birth: [birth year] No Data Recorded  Encounter Date: 09/09/2015      PT End of Session - 09/09/15 1018    Visit Number 20   Number of Visits 24   Date for PT Re-Evaluation 09/16/15   PT Start Time 0277   PT Stop Time 1103   PT Time Calculation (min) 48 min      Past Medical History  Diagnosis Date  . Complication of anesthesia   . PONV (postoperative nausea and vomiting)   . Family history of anesthesia complication     N&V- Mother   . Heart murmur     since birth  . Hypertension     followed by Dr. Marisue Humble   . H/O hiatal hernia     small hiatal hernia, has had endoscopy & colondoscopy  . GERD (gastroesophageal reflux disease)     Chron's disease, not medically treating currently   . Arthritis     lumbar- HNP, /w myelopathy  . Bronchitis     used albuterol , Sept. 2013, all clear now   . Asthma     /w reflux , consult /w Dr. Wert-2013 / USES INHALER PRN INFREQUENTLY  . Numbness in left leg   . Stress incontinence   . Difficulty sleeping     DUE TO PAIN  . Anxiety   . H/O vertigo     Past Surgical History  Procedure Laterality Date  . Cholecystectomy  03    laparoscopic  . Lumbar laminectomy/decompression microdiscectomy  09/10/2012    Procedure: LUMBAR LAMINECTOMY/DECOMPRESSION MICRODISCECTOMY 1 LEVEL;  Surgeon: Kristeen Miss, MD;  Location: [place] [facility];  Service: Neurosurgery;  Laterality: Left;  Left Lumbar Five-Sacral One Microdiscectomy  . Back surgery  2013  . Total hip arthroplasty Left 12/24/2014    Procedure: LEFT TOTAL HIP ARTHROPLASTY ANTERIOR APPROACH;  Surgeon: Mauri Pole, MD;  Location: [facility];  Service: Orthopedics;  Laterality: Left;    There were no vitals filed for this visit.  Visit Diagnosis:   Weakness of left lower extremity  Posture abnormality  Lumbar back pain with radiculopathy affecting left lower extremity  Pain in joint, pelvic region and thigh, left  Lumbar radiculopathy  Abnormality of gait  Midline low back pain with sciatica, sciatica laterality unspecified  Weakness of left hip  Sensory disturbance      Subjective Assessment - 09/09/15 1017    Subjective No pain, both feet are N/T as well as left calf.   Currently in Pain? No/denies            [facility] PT Assessment - 09/09/15 1112    Observation/Other Assessments   Focus on Therapeutic Outcomes (FOTO)  48 % limited improved from 58% limited on eval.    Strength   Left Hip External Rotation 4-/5   Left Hip Internal Rotation 4-/5   Left Ankle Dorsiflexion 4/5  GR. TOE ext 3/5                     [facility] Adult PT Treatment/Exercise - 09/09/15 1110    Self-Care   Self-Care Other Self-Care Comments   Other Self-Care Comments  Answered questions about HEP and demonstrated various positions and exercises to continue working on Hip IR/ER strength/ ROM and advice to keep working core strength  at low/moderate level and always watch form.    Lumbar Exercises: Stretches   Piriformis Stretch 2 reps;30 seconds   Piriformis Stretch Limitations supine and seated   Lumbar Exercises: Aerobic   Stationary Bike Rec Bike Level 2 x 5 min   Tread Mill 2.5 mph and 2% grade( last 5 minutes) for 20 minutes- pt reports no increase in sx or pain.                   PT Short Term Goals - 09/09/15 1106    PT SHORT TERM GOAL #1   Title Pt will increase hamstring flexibility on the L LE to match the R to assist in normalizing gait pattern   Time 4   Period Weeks   Status Achieved   PT SHORT TERM GOAL #2   Title PT will report ability to sit for 65mnutes without an increase in pain and numbness in order to enjoy her lunch break.   Baseline 177m   Time 4   Period Weeks   Status Not Met   PT  SHORT TERM GOAL #3   Title I with initial HEP   Time 2   Period Weeks   Status Achieved   PT SHORT TERM GOAL #4   Title She will report tingling in lower leg and foot decr 25%    Time 4   Period Weeks   Status Not Met           PT Long Term Goals - 09/09/15 1107    PT LONG TERM GOAL #1   Title pt will be I with advanced HEP   Time 8   Period Weeks   Status Achieved   PT LONG TERM GOAL #2   Title Pt will report decreased back pain to 3/10 with normal daily activities (non-work)   Time 8   Period Weeks   Status Achieved   PT LONG TERM GOAL #3   Title Pt will report improved ability to walk for extended periods (up an hour) with a min  increase in pain in order to perform work duties   Baseline 20 minutes today on T.M at 2.5 mph and 2 % grade for the last 5 minutes without increased pain.    Time 8   Period Weeks   Status Unable to assess   PT LONG TERM GOAL #4   Title Pt will demo normalized gait pattern indicating decreased pain and increased strength in L LE.   Baseline improved short diststances in clinic; LLE fatigues and is difficult to maintain neutral Hip with gait.    Time 8   Period Weeks   Status Partially Met   PT LONG TERM GOAL #5   Title Pt will demonstrate appropriate body mechanics when pushing, pulling, and lifting heavy objects in order to prevent injury in the future   Time 8   Period Weeks   Status Achieved               Plan - 09/09/15 1019    Clinical Impression Statement Sitting for 30 minutes causes burning lower lumbar/sacrum as well as increased N/T in bilateral feet, left caif and left foot turns cold and purple. She reports no pain today and attributes it to not lifting, pulling, pushing like she would be with her work duties. See Goals Met-  Trial of T.M. walking today to assess LTG#3.Pt ambulated 20 minutes without increased sx. She has not tried prolonged walking on her own. Pt  has HEP and is independent. 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 She reports her overall LBP has decreased by 50% since beginning PT.    PT Next Visit Plan discharge today        Problem List Patient Active Problem List   Diagnosis Date Noted  . 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 Dizziness and giddiness 02/07/2013  . Cough 09/23/2011  . Hypertension 08/11/2011  . Obesity 08/11/2011    Dorene Ar, PTA 09/09/2015, 12:38 PM  [place], [place], [address] Phone: [phone]   Fax:  [phone]  Name: Nicole [name] MRN: [id number] Date of Birth: 1956-11-07    PHYSICAL THERAPY DISCHARGE SUMMARY  Visits from Start of Care: 20  Current functional level related to goals / functional outcomes: See above, 50% better per patient overall   Remaining deficits: Pain, LE weakness and decreased ability to do work   Education / Equipment: Lifting ,posture, gait, HEP, Pilates, body mechanics, anatomy Plan: Patient agrees to discharge.  Patient goals were partially met. Patient is being discharged due to being pleased with the current functional level.  ?????   Raeford Razor, PT 09/10/2015 10:09 AM Phone: [phone] Fax: [phone]

## 2015-09-10 ENCOUNTER — Encounter: Payer: Self-pay | Admitting: Physical Therapy

## 2015-09-26 ENCOUNTER — Encounter: Payer: Self-pay | Admitting: Surgery

## 2015-09-26 DIAGNOSIS — L02214 Cutaneous abscess of groin: Secondary | ICD-10-CM | POA: Insufficient documentation

## 2015-09-26 NOTE — Progress Notes (Signed)
Patient ID: Nicole [name], female   DOB: [birth year], 59 y.o.   MRN: [id number] Patient encounter [facility] Office Saturday, Sep 26, 2015 S:  Levander Campion called about an infected cyst in her perineum.  She had initially tried to express infected material from this and it got worse.  She saw her GYN who opened it and placed her on Clindamycin and Diflucan.  She was seen back and her had broken out in hives in her upper trunk and so she was changed to Augmentin 875 which she has taken one of thus far.  She was concerned because her right mons was more pink and she was concerned about spreading infection. O:  There is a red, raised irregular rash about the trunk, arms, neck and head (scalp itching).  This is more prominent in the folds of her lower abdomen.  Separately, the pink area extends slightly up on the right mons but there is not underlying crepitus or hardness.  The .5 cm opening in the right inguinal crease was explored with a Q tip and this went cephalad for about an inch and inferiorly about a half inch.  I did not encounter any undrained pus and I irrigated with area with 5 cc of hydrogen peroxide.  I marked the area of redness with a Sharpie.   A:  Infected right inguinal cyst (drained) with overlying cellulitis.  No fever, chills, or feeling bad.  Allergic reaction probably to Clindamycin with upper body hives.   P:  Take Augmentin as directed and Sitz baths with Epsom salt;  Hair dryer to keep perineum dry and wear pad for drainage.  Assess redness by photo after marking to check for progression.  Call me for evidence of progression or if feeling worse; otherwise keep appt with GYN on Monday.   Matt B. Hassell Done, MD, [facility] [phone] beeper [phone]  09/26/2015 4:48 PM

## 2015-10-01 ENCOUNTER — Encounter: Payer: Self-pay | Admitting: Emergency Medicine

## 2015-10-01 ENCOUNTER — Ambulatory Visit (INDEPENDENT_AMBULATORY_CARE_PROVIDER_SITE_OTHER): Payer: 59 | Admitting: Emergency Medicine

## 2015-10-01 VITALS — BP 114/68 | HR 78 | Ht 66.0 in | Wt 193.0 lb

## 2015-10-01 DIAGNOSIS — J4520 Mild intermittent asthma, uncomplicated: Secondary | ICD-10-CM

## 2015-10-01 DIAGNOSIS — J45909 Unspecified asthma, uncomplicated: Secondary | ICD-10-CM | POA: Insufficient documentation

## 2015-10-01 MED ORDER — ALBUTEROL SULFATE HFA 108 (90 BASE) MCG/ACT IN AERS: 2.0000 | INHALATION_SPRAY | Freq: Four times a day (QID) | RESPIRATORY_TRACT | Status: DC | PRN

## 2015-10-01 NOTE — Progress Notes (Signed)
Subjective:    Patient ID: Nicole [name], female    DOB: Jan 19, 1956, 59 y.o.   MRN: [id number]  HPI 59 year old woman, former smoker (40-pack-year), with a history of hypertension. She been seen before for possible asthma / cough by Dr Melvyn Novas, improved when treated for GERD. May have benefited from [facility] at that time. She had repeat pulmonary function testing performed on 06/18/15 that I personally reviewed. This shows moderate obstruction with a borderline bronchodilator response, normal lung volumes and diffusion capacity.  She reports that she noticed more cough and shortness of breath, some chest discomfort, in Feb 2016 after a hip sgy. All are better but she still notices exertional SOB when she exercises, rides bike.     Review of Systems  Constitutional: Negative for fever and unexpected weight change.  HENT: Negative for congestion, dental problem, ear pain, nosebleeds, postnasal drip, rhinorrhea, sinus pressure, sneezing, sore throat and trouble swallowing.   Eyes: Negative for redness and itching.  Respiratory: Positive for shortness of breath. Negative for cough, chest tightness and wheezing.   Cardiovascular: Negative for palpitations and leg swelling.  Gastrointestinal: Negative for nausea and vomiting.  Genitourinary: Negative for dysuria.  Musculoskeletal: Negative for joint swelling.  Skin: Negative for rash.  Neurological: Negative for headaches.  Hematological: Does not bruise/bleed easily.  Psychiatric/Behavioral: Negative for dysphoric mood. The patient is nervous/anxious.    Past Medical History  Diagnosis Date  . Complication of anesthesia   . PONV (postoperative nausea and vomiting)   . Family history of anesthesia complication     N&V- Mother   . Heart murmur     since birth  . Hypertension     followed by Dr. Marisue Humble   . H/O hiatal hernia     small hiatal hernia, has had endoscopy & colondoscopy  . GERD (gastroesophageal reflux disease)     Chron's  disease, not medically treating currently   . Arthritis     lumbar- HNP, /w myelopathy  . Bronchitis     used albuterol , Sept. 2013, all clear now   . Asthma     /w reflux , consult /w Dr. Wert-2013 / USES INHALER PRN INFREQUENTLY  . Numbness in left leg   . Stress incontinence   . Difficulty sleeping     DUE TO PAIN  . Anxiety   . H/O vertigo      Family History  Problem Relation Age of Onset  . Diabetes Father   . Stroke Father   . Hypertension Father   . Kidney disease Father   . Kidney disease Mother   . Asthma Mother   . Thyroid disease Mother   . Glaucoma Mother   . Hypertension Sister      Social History   Social History  . Marital Status: Divorced    Spouse Name: N/A  . Number of Children: N/A  . Years of Education: N/A   Occupational History  . RN [employer]   Social History Main Topics  . Smoking status: Former Smoker -- 1.50 packs/day for 27 years    Types: Cigarettes    Quit date: 11/15/1999  . Smokeless tobacco: Never Used  . Alcohol Use: Yes     Comment: Seldom drinks, but occasional wine.  . Drug Use: No  . Sexual Activity: Not on file   Other Topics Concern  . Not on file   Social History Narrative     Allergies  Allergen Reactions  . Hctz [Hydrochlorothiazide] Other (See  Comments)    MUSCLE CRAMPING  . Metoclopramide Hcl Other (See Comments)    Cramping, anxiety and depression; "makes me crazy"  . Voltaren [Diclofenac Sodium] Other (See Comments)    Swelling in leg and became parched.   . Avelox [Moxifloxacin Hcl In Nacl] Nausea And Vomiting  . Clindamycin/Lincomycin Rash  . Flagyl [Metronidazole] Rash  . Neosporin [Neomycin-Bacitracin Zn-Polymyx] Other (See Comments)    Blisters, rash     Outpatient Prescriptions Prior to Visit  Medication Sig Dispense Refill  . ALPRAZolam (XANAX) 0.25 MG tablet Take 0.25 mg by mouth at bedtime as needed for sleep.    Marland Kitchen diltiazem (CARDIZEM CD) 180 MG 24 hr capsule Take 1 capsule (180 mg  total) by mouth daily. 90 capsule 3  . esomeprazole (NEXIUM) 40 MG capsule Take 40 mg by mouth 2 (two) times daily. Pt can not take generic    . losartan (COZAAR) 100 MG tablet Take 100 mg by mouth daily.     Marland Kitchen aspirin EC 325 MG EC tablet Take 1 tablet (325 mg total) by mouth 2 (two) times daily. 60 tablet 0  . Cholecalciferol (VITAMIN D3) 1000 UNITS CAPS Take 1 capsule by mouth daily.    Marland Kitchen estradiol (CLIMARA - DOSED IN MG/24 HR) 0.075 mg/24hr patch Place 0.075 mg onto the skin 2 (two) times a week. Pt does not change on any particular day    . MAGNESIUM PO Take 1 tablet by mouth daily.    . polyethylene glycol (MIRALAX / GLYCOLAX) packet Take 17 g by mouth 2 (two) times daily. (Patient taking differently: Take 17 g by mouth daily as needed for mild constipation. ) 14 each 0  . vitamin B-12 (CYANOCOBALAMIN) 500 MCG tablet Take 500 mcg by mouth daily.     No facility-administered medications prior to visit.         Objective:   Physical Exam Filed Vitals:   10/01/15 1040  BP: 114/68  Pulse: 78  Height: 5\' 6"  (1.676 m)  Weight: 193 lb (87.544 kg)  SpO2: 98%   Gen: Pleasant, well-nourished, in no distress,  normal affect  ENT: No lesions,  mouth clear,  oropharynx clear, no postnasal drip  Neck: No JVD, no TMG, no carotid bruits  Lungs: No use of accessory muscles, clear without rales or rhonchi  Cardiovascular: RRR, heart sounds normal, no murmur or gallops, no peripheral edema  Musculoskeletal: No deformities, no cyanosis or clubbing  Neuro: alert, non focal  Skin: Warm, no lesions or rashes      Assessment & Plan:  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 Pulmonary function testing supports this as well particularly given her borderline bronchodilator response. I'd like to start by treating her with SABA when necessary, assess her benefit and her frequency of use. If she his using  frequently then we will consider starting an ICS versus Dulera.

## 2015-10-01 NOTE — Assessment & Plan Note (Signed)
She has COPD with asthmatic features given her response in the past to dulera and her clinical presentation. Pulmonary function testing supports this as well particularly given her borderline bronchodilator response. I'd like to start by treating her with SABA when necessary, assess her benefit and her frequency of use. If she his using frequently then we will consider starting an ICS versus Dulera.

## 2015-10-01 NOTE — Patient Instructions (Signed)
We will start albuterol 1-2 puffs up to every 4 hours if needed for shortness of breath. You should also use this about 15 minutes before your exercise routine.  If we find that you are requiring the albuterol frequently then we will consider adding and every-day scheduled inhaled medication.  Follow with Dr Lamonte Sakai in 3 months or sooner if you have any problems.

## 2015-11-19 MED FILL — DILTIAZEM 24HR ER 180 MG CA: 180 | 60 days supply | Qty: 60 | Fill #6

## 2015-11-20 MED FILL — MUPIROCIN 2% OINTMENT: 2 | 10 days supply | Qty: 22 | Fill #0

## 2015-12-01 MED FILL — ALPRAZolam 0.5 MG TABS: 0.5 | 90 days supply | Qty: 360 | Fill #0

## 2015-12-01 MED FILL — valACYclovir HCL 1 GM TABS: 1 | 4 days supply | Qty: 8 | Fill #3

## 2015-12-04 MED FILL — IBUPROFEN 800 MG TABLET: 800 | 30 days supply | Qty: 90 | Fill #1

## 2015-12-04 MED FILL — CYCLOBENZAPRINE 10 MG TAB: 10 | 30 days supply | Qty: 90 | Fill #1

## 2015-12-11 DIAGNOSIS — Z23 Encounter for immunization: Secondary | ICD-10-CM | POA: Diagnosis not present

## 2015-12-15 MED FILL — LOSARTAN POTASSIUM 100 MG T: 100 | 90 days supply | Qty: 90 | Fill #2

## 2015-12-16 MED FILL — HYDROCODON-APAP 5-325: 5-325 | 15 days supply | Qty: 60 | Fill #0

## 2015-12-18 DIAGNOSIS — M5442 Lumbago with sciatica, left side: Secondary | ICD-10-CM | POA: Diagnosis not present

## 2015-12-18 DIAGNOSIS — Z96642 Presence of left artificial hip joint: Secondary | ICD-10-CM | POA: Diagnosis not present

## 2015-12-18 DIAGNOSIS — Z471 Aftercare following joint replacement surgery: Secondary | ICD-10-CM | POA: Diagnosis not present

## 2015-12-21 DIAGNOSIS — J069 Acute upper respiratory infection, unspecified: Secondary | ICD-10-CM | POA: Diagnosis not present

## 2015-12-21 DIAGNOSIS — R05 Cough: Secondary | ICD-10-CM | POA: Diagnosis not present

## 2015-12-21 MED FILL — predniSONE 10 MG TABS: 10 | 6 days supply | Qty: 21 | Fill #0

## 2015-12-21 MED FILL — HYDROCODONE-CHLORPHENIRAM S: 10-8 | 12 days supply | Qty: 120 | Fill #0

## 2015-12-21 MED FILL — BENZONATATE 100 MG CAPSULE: 100 | 10 days supply | Qty: 60 | Fill #0

## 2015-12-25 MED FILL — NITROFURANTOIN MONO-MCR 100: 100 | 7 days supply | Qty: 14 | Fill #0

## 2016-01-04 ENCOUNTER — Ambulatory Visit (INDEPENDENT_AMBULATORY_CARE_PROVIDER_SITE_OTHER): Payer: 59 | Admitting: Emergency Medicine

## 2016-01-04 ENCOUNTER — Encounter: Payer: Self-pay | Admitting: Emergency Medicine

## 2016-01-04 VITALS — BP 122/80 | HR 87 | Ht 66.0 in | Wt 194.0 lb

## 2016-01-04 DIAGNOSIS — J4520 Mild intermittent asthma, uncomplicated: Secondary | ICD-10-CM

## 2016-01-04 NOTE — Patient Instructions (Signed)
Continue your albuterol 2 puffs as needed as you have been using it (including before exercise) We will not start an every-day inhaler at this time.  Try to avoid sick exposure - wash hands Follow with Dr Lamonte Sakai in 6 months or sooner if you have any problems

## 2016-01-04 NOTE — Progress Notes (Signed)
   Subjective:    Patient ID: Nicole [name], female    DOB: Mar 16, 1956, 60 y.o.   MRN: [id number]  HPI 60 year old woman, former smoker (40-pack-year), with a history of hypertension. She been seen before for possible asthma / cough by Dr Melvyn Novas, improved when treated for GERD. May have benefited from [facility] at that time. She had repeat pulmonary function testing performed on 06/18/15 that I personally reviewed. This shows moderate obstruction with a borderline bronchodilator response, normal lung volumes and diffusion capacity.  She reports that she noticed more cough and shortness of breath, some chest discomfort, in Feb 2016 after a hip sgy. All are better but she still notices exertional SOB when she exercises, rides bike.   ROV 01/04/16 -- follow-up visit for COPD with asthmatic features. She has a borderline bronchodilator response on spirometry. In November we started albuterol.  She reports that she has been coughing, was exposed to URI. She was treated with steroid taper, symptomatic relief. She used the SABA before exercise and it did help her (wasn't enough to reverse the recent sx associated with her URI).     Review of Systems  Constitutional: Negative for fever and unexpected weight change.  HENT: Negative for congestion, dental problem, ear pain, nosebleeds, postnasal drip, rhinorrhea, sinus pressure, sneezing, sore throat and trouble swallowing.   Eyes: Negative for redness and itching.  Respiratory: Positive for shortness of breath. Negative for cough, chest tightness and wheezing.   Cardiovascular: Negative for palpitations and leg swelling.  Gastrointestinal: Negative for nausea and vomiting.  Genitourinary: Negative for dysuria.  Musculoskeletal: Negative for joint swelling.  Skin: Negative for rash.  Neurological: Negative for headaches.  Hematological: Does not bruise/bleed easily.  Psychiatric/Behavioral: Negative for dysphoric mood. The patient is nervous/anxious.       Objective:   Physical Exam Filed Vitals:   01/04/16 1534  BP: 122/80  Pulse: 87  Height: 5\' 6"  (1.676 m)  Weight: 194 lb (87.998 kg)  SpO2: 97%   Gen: Pleasant, well-nourished, in no distress,  normal affect  ENT: No lesions,  mouth clear,  oropharynx clear, no postnasal drip  Neck: No JVD, no TMG, no carotid bruits  Lungs: No use of accessory muscles, clear without rales or rhonchi  Cardiovascular: RRR, heart sounds normal, no murmur or gallops, no peripheral edema  Musculoskeletal: No deformities, no cyanosis or clubbing  Neuro: alert, non focal  Skin: Warm, no lesions or rashes      Assessment & Plan:  Intrinsic asthma Continue your albuterol 2 puffs as needed as you have been using it (including before exercise) We will not start an every-day inhaler at this time.  Try to avoid sick exposure - wash hands Follow with Dr Lamonte Sakai in 6 months or sooner if you have any problems

## 2016-01-04 NOTE — Assessment & Plan Note (Signed)
Continue your albuterol 2 puffs as needed as you have been using it (including before exercise) We will not start an every-day inhaler at this time.  Try to avoid sick exposure - wash hands Follow with Dr Lamonte Sakai in 6 months or sooner if you have any problems

## 2016-02-03 MED FILL — DILTIAZEM 24HR ER 180 MG CA: 180 | 90 days supply | Qty: 90 | Fill #0

## 2016-02-25 MED FILL — NexIUM 40 MG CPDR: 40 | 90 days supply | Qty: 180 | Fill #0

## 2016-03-09 DIAGNOSIS — L565 Disseminated superficial actinic porokeratosis (DSAP): Secondary | ICD-10-CM | POA: Diagnosis not present

## 2016-03-09 DIAGNOSIS — L570 Actinic keratosis: Secondary | ICD-10-CM | POA: Diagnosis not present

## 2016-03-16 DIAGNOSIS — Z96642 Presence of left artificial hip joint: Secondary | ICD-10-CM | POA: Diagnosis not present

## 2016-03-16 DIAGNOSIS — Z471 Aftercare following joint replacement surgery: Secondary | ICD-10-CM | POA: Diagnosis not present

## 2016-03-25 ENCOUNTER — Encounter: Payer: Self-pay | Admitting: Interventional Cardiology

## 2016-03-25 ENCOUNTER — Ambulatory Visit (INDEPENDENT_AMBULATORY_CARE_PROVIDER_SITE_OTHER): Payer: 59 | Admitting: Interventional Cardiology

## 2016-03-25 VITALS — BP 102/78 | HR 78 | Ht 66.0 in | Wt 194.1 lb

## 2016-03-25 DIAGNOSIS — I10 Essential (primary) hypertension: Secondary | ICD-10-CM

## 2016-03-25 DIAGNOSIS — R42 Dizziness and giddiness: Secondary | ICD-10-CM | POA: Diagnosis not present

## 2016-03-25 MED ORDER — LOSARTAN POTASSIUM 100 MG PO TABS: 100.0000 mg | ORAL_TABLET | Freq: Every day | ORAL | Status: DC

## 2016-03-25 MED ORDER — DILTIAZEM HCL ER COATED BEADS 180 MG PO CP24: 180.0000 mg | ORAL_CAPSULE | Freq: Every day | ORAL | Status: DC

## 2016-03-25 NOTE — Progress Notes (Signed)
Cardiology Office Note    Date:  03/25/2016   ID:  Nicole [name], DOB 02/02/1956, MRN [id number]  PCP:  Simona Huh, MD  Cardiologist: Sinclair Grooms, MD   Chief Complaint  Patient presents with  . Follow-up    Hypertension    History of Present Illness:  Nicole [name] is a 60 y.o. female hypertension and also complaints of dizziness and giddiness.  Overall the patient is doing well and has no complaints.  Past Medical History  Diagnosis Date  . Complication of anesthesia   . PONV (postoperative nausea and vomiting)   . Family history of anesthesia complication     N&V- Mother   . Heart murmur     since birth  . Hypertension     followed by Dr. Marisue Humble   . H/O hiatal hernia     small hiatal hernia, has had endoscopy & colondoscopy  . GERD (gastroesophageal reflux disease)     Chron's disease, not medically treating currently   . Arthritis     lumbar- HNP, /w myelopathy  . Bronchitis     used albuterol , Sept. 2013, all clear now   . Asthma     /w reflux , consult /w Dr. Wert-2013 / USES INHALER PRN INFREQUENTLY  . Numbness in left leg   . Stress incontinence   . Difficulty sleeping     DUE TO PAIN  . Anxiety   . H/O vertigo     Past Surgical History  Procedure Laterality Date  . Cholecystectomy  03    laparoscopic  . Lumbar laminectomy/decompression microdiscectomy  09/10/2012    Procedure: LUMBAR LAMINECTOMY/DECOMPRESSION MICRODISCECTOMY 1 LEVEL;  Surgeon: Kristeen Miss, MD;  Location: [facility];  Service: Neurosurgery;  Laterality: Left;  Left Lumbar Five-Sacral One Microdiscectomy  . Back surgery  2013  . Total hip arthroplasty Left 12/24/2014    Procedure: LEFT TOTAL HIP ARTHROPLASTY ANTERIOR APPROACH;  Surgeon: Mauri Pole, MD;  Location: [facility];  Service: Orthopedics;  Laterality: Left;    Current Medications: Outpatient Prescriptions Prior to Visit  Medication Sig Dispense Refill  . albuterol (PROVENTIL HFA;VENTOLIN HFA) 108 (90  BASE) MCG/ACT inhaler Inhale 2 puffs into the lungs every 6 (six) hours as needed for wheezing or shortness of breath. 1 Inhaler 2  . diltiazem (CARDIZEM CD) 180 MG 24 hr capsule Take 1 capsule (180 mg total) by mouth daily. 90 capsule 3  . esomeprazole (NEXIUM) 40 MG capsule Take 40 mg by mouth 2 (two) times daily. Pt can not take generic    . losartan (COZAAR) 100 MG tablet Take 100 mg by mouth daily.     Marland Kitchen ALPRAZolam (XANAX) 0.25 MG tablet Take 0.25 mg by mouth at bedtime as needed for sleep.    . benzonatate (TESSALON) 100 MG capsule Take 100-200 mg by mouth 3 (three) times daily as needed for cough.    . chlorpheniramine-HYDROcodone (TUSSIONEX PENNKINETIC ER) 10-8 MG/5ML SUER Take 5 mLs by mouth as needed for cough.     No facility-administered medications prior to visit.     Allergies:   Hctz; Metoclopramide hcl; Voltaren; Avelox; Clindamycin/lincomycin; Flagyl; and Neosporin   Social History   Social History  . Marital Status: Divorced    Spouse Name: N/A  . Number of Children: N/A  . Years of Education: N/A   Occupational History  . RN [employer]   Social History Main Topics  . Smoking status: Former Smoker -- 1.50 packs/day for  27 years    Types: Cigarettes    Quit date: 11/15/1999  . Smokeless tobacco: Never Used  . Alcohol Use: Yes     Comment: Seldom drinks, but occasional wine.  . Drug Use: No  . Sexual Activity: Not Asked   Other Topics Concern  . None   Social History Narrative     Family History:  The patient's family history includes Asthma in her mother; Diabetes in her father; Glaucoma in her mother; Hypertension in her father and sister; Kidney disease in her father and mother; Stroke in her father; Thyroid disease in her mother.   ROS:   Please see the history of present illness.   Chronic back discomfort, muscle pain, and difficulty with balance and walking. ROS All other systems reviewed and are negative.   PHYSICAL EXAM:   VS:  BP 102/78 mmHg   Pulse 78  Ht 5\' 6"  (1.676 m)  Wt 194 lb 1.9 oz (88.052 kg)  BMI 31.35 kg/m2   GEN: Well nourished, well developed, in no acute distress HEENT: normal Neck: no JVD, carotid bruits, or masses Cardiac: RRR; no murmurs, rubs, or gallops,no edema  Respiratory:  clear to auscultation bilaterally, normal work of breathing GI: soft, nontender, nondistended, + BS MS: no deformity or atrophy Skin: warm and dry, no rash Neuro:  Alert and Oriented x 3, Strength and sensation are intact Psych: euthymic mood, full affect  Wt Readings from Last 3 Encounters:  03/25/16 194 lb 1.9 oz (88.052 kg)  01/04/16 194 lb (87.998 kg)  10/01/15 193 lb (87.544 kg)      Studies/Labs Reviewed:   EKG:  EKG  Normal sinus rhythm with overall normal appearance  Recent Labs: No results found for requested labs within last 365 days.   Lipid Panel No results found for: CHOL, TRIG, HDL, CHOLHDL, VLDL, LDLCALC, LDLDIRECT  Additional studies/ records that were reviewed today include:  No data reviewed    ASSESSMENT:    1. Essential hypertension   2. Dizziness and giddiness      PLAN:  In order of problems listed above:  1. The blood pressure is very well controlled. We will continue with salt restriction and antihypertensive therapy as listed. Plan follow-up in one year    Medication Adjustments/Labs and Tests Ordered: Current medicines are reviewed at length with the patient today.  Concerns regarding medicines are outlined above.  Medication changes, Labs and Tests ordered today are listed in the Patient Instructions below. There are no Patient Instructions on file for this visit.   Signed, Sinclair Grooms, MD  03/25/2016 3:32 PM    [facility] [place], [place], [place]  [address] Phone: [phone]; Fax: [phone]

## 2016-03-25 NOTE — Patient Instructions (Signed)
Your physician recommends that you continue on your current medications as directed. Please refer to the Current Medication list given to you today. Your physician wants you to follow-up in: 1 year with Dr. Tamala Julian.  You will receive a reminder letter in the mail two months in advance. If you don't receive a letter, please call our office to schedule the follow-up appointment.  Low-Sodium Eating Plan Sodium raises blood pressure and causes water to be held in the body. Getting less sodium from food will help lower your blood pressure, reduce any swelling, and protect your heart, liver, and kidneys. We get sodium by adding salt (sodium chloride) to food. Most of our sodium comes from canned, boxed, and frozen foods. Restaurant foods, fast foods, and pizza are also very high in sodium. Even if you take medicine to lower your blood pressure or to reduce fluid in your body, getting less sodium from your food is important. WHAT IS MY PLAN? Most people should limit their sodium intake to 2,300 mg a day. Your health care provider recommends that you limit your sodium intake to _2000 mg__ a day.  WHAT DO I NEED TO KNOW ABOUT THIS EATING PLAN? For the low-sodium eating plan, you will follow these general guidelines:  Choose foods with a % Daily Value for sodium of less than 5% (as listed on the food label).   Use salt-free seasonings or herbs instead of table salt or sea salt.   Check with your health care provider or pharmacist before using salt substitutes.   Eat fresh foods.  Eat more vegetables and fruits.  Limit canned vegetables. If you do use them, rinse them well to decrease the sodium.   Limit cheese to 1 oz (28 g) per day.   Eat lower-sodium products, often labeled as "lower sodium" or "no salt added."  Avoid foods that contain monosodium glutamate (MSG). MSG is sometimes added to [place] food and some canned foods.  Check food labels (Nutrition Facts labels) on foods to learn how  much sodium is in one serving.  Eat more home-cooked food and less restaurant, buffet, and fast food.  When eating at a restaurant, ask that your food be prepared with less salt, or no salt if possible.  HOW DO I READ FOOD LABELS FOR SODIUM INFORMATION? The Nutrition Facts label lists the amount of sodium in one serving of the food. If you eat more than one serving, you must multiply the listed amount of sodium by the number of servings. Food labels may also identify foods as:  Sodium free--Less than 5 mg in a serving.  Very low sodium--35 mg or less in a serving.  Low sodium--140 mg or less in a serving.  Light in sodium--50% less sodium in a serving. For example, if a food that usually has 300 mg of sodium is changed to become light in sodium, it will have 150 mg of sodium.  Reduced sodium--25% less sodium in a serving. For example, if a food that usually has 400 mg of sodium is changed to reduced sodium, it will have 300 mg of sodium. WHAT FOODS CAN I EAT? Grains Low-sodium cereals, including oats, puffed wheat and rice, and shredded wheat cereals. Low-sodium crackers. Unsalted rice and pasta. Lower-sodium bread.  Vegetables Frozen or fresh vegetables. Low-sodium or reduced-sodium canned vegetables. Low-sodium or reduced-sodium tomato sauce and paste. Low-sodium or reduced-sodium tomato and vegetable juices.  Fruits Fresh, frozen, and canned fruit. Fruit juice.  Meat and Other Protein Products Low-sodium canned tuna  and salmon. Fresh or frozen meat, poultry, seafood, and fish. Lamb. Unsalted nuts. Dried beans, peas, and lentils without added salt. Unsalted canned beans. Homemade soups without salt. Eggs.  Dairy Milk. Soy milk. Ricotta cheese. Low-sodium or reduced-sodium cheeses. Yogurt.  Condiments Fresh and dried herbs and spices. Salt-free seasonings. Onion and garlic powders. Low-sodium varieties of mustard and ketchup. Fresh or refrigerated horseradish. Lemon juice.    Fats and Oils Reduced-sodium salad dressings. Unsalted butter.  Other Unsalted popcorn and pretzels.  The items listed above may not be a complete list of recommended foods or beverages. Contact your dietitian for more options. WHAT FOODS ARE NOT RECOMMENDED? Grains Instant hot cereals. Bread stuffing, pancake, and biscuit mixes. Croutons. Seasoned rice or pasta mixes. Noodle soup cups. Boxed or frozen macaroni and cheese. Self-rising flour. Regular salted crackers. Vegetables Regular canned vegetables. Regular canned tomato sauce and paste. Regular tomato and vegetable juices. Frozen vegetables in sauces. Salted [place] fries. Olives. Angie Fava. Relishes. Sauerkraut. Salsa. Meat and Other Protein Products Salted, canned, smoked, spiced, or pickled meats, seafood, or fish. Bacon, ham, sausage, hot dogs, corned beef, chipped beef, and packaged luncheon meats. Salt pork. Jerky. Pickled herring. Anchovies, regular canned tuna, and sardines. Salted nuts. Dairy Processed cheese and cheese spreads. Cheese curds. Blue cheese and cottage cheese. Buttermilk.  Condiments Onion and garlic salt, seasoned salt, table salt, and sea salt. Canned and packaged gravies. Worcestershire sauce. Tartar sauce. Barbecue sauce. Teriyaki sauce. Soy sauce, including reduced sodium. Steak sauce. Fish sauce. Oyster sauce. Cocktail sauce. Horseradish that you find on the shelf. Regular ketchup and mustard. Meat flavorings and tenderizers. Bouillon cubes. Hot sauce. Tabasco sauce. Marinades. Taco seasonings. Relishes. Fats and Oils Regular salad dressings. Salted butter. Margarine. Ghee. Bacon fat.  Other Potato and tortilla chips. Corn chips and puffs. Salted popcorn and pretzels. Canned or dried soups. Pizza. Frozen entrees and pot pies.  The items listed above may not be a complete list of foods and beverages to avoid. Contact your dietitian for more information.   This information is not intended to replace  advice given to you by your health care provider. Make sure you discuss any questions you have with your health care provider.   Document Released: 04/22/2002 Document Revised: 11/21/2014 Document Reviewed: 09/04/2013 Elsevier Interactive Patient Education [employer].

## 2016-04-12 MED FILL — AMOXICILLIN 500 MG CAPSULE: 500 | 3 days supply | Qty: 12 | Fill #0

## 2016-04-19 MED FILL — MUPIROCIN 2% OINTMENT: 2 | 10 days supply | Qty: 22 | Fill #0

## 2016-04-19 MED FILL — AMOXICILLIN 875 MG TABLET: 875 | 7 days supply | Qty: 14 | Fill #0

## 2016-04-28 MED FILL — LOSARTAN POTASSIUM 100 MG T: 100 | 90 days supply | Qty: 90 | Fill #0

## 2016-05-02 MED FILL — valACYclovir HCL 1 GM TABS: 1 | 4 days supply | Qty: 8 | Fill #0

## 2016-05-02 MED FILL — CARTIA XT 180 MG CAPSULE SA: 180 | 90 days supply | Qty: 90 | Fill #0

## 2016-05-04 DIAGNOSIS — M5416 Radiculopathy, lumbar region: Secondary | ICD-10-CM | POA: Diagnosis not present

## 2016-05-04 DIAGNOSIS — Z6831 Body mass index (BMI) 31.0-31.9, adult: Secondary | ICD-10-CM | POA: Diagnosis not present

## 2016-05-06 MED FILL — CYCLOBENZAPRINE 10 MG TAB: 10 | 30 days supply | Qty: 90 | Fill #2

## 2016-05-06 MED FILL — HYDROCODON-APAP 5-325: 5-325 | 15 days supply | Qty: 60 | Fill #0

## 2016-05-24 MED FILL — NexIUM 40 MG CPDR: 40 | 90 days supply | Qty: 180 | Fill #0

## 2016-05-31 MED FILL — AMOXICILLIN 500 MG CAPSULE: 500 | 3 days supply | Qty: 12 | Fill #0

## 2016-06-17 DIAGNOSIS — H35372 Puckering of macula, left eye: Secondary | ICD-10-CM | POA: Diagnosis not present

## 2016-06-17 DIAGNOSIS — H4312 Vitreous hemorrhage, left eye: Secondary | ICD-10-CM | POA: Diagnosis not present

## 2016-06-17 DIAGNOSIS — H43813 Vitreous degeneration, bilateral: Secondary | ICD-10-CM | POA: Diagnosis not present

## 2016-06-17 DIAGNOSIS — H33302 Unspecified retinal break, left eye: Secondary | ICD-10-CM | POA: Diagnosis not present

## 2016-06-17 DIAGNOSIS — H52223 Regular astigmatism, bilateral: Secondary | ICD-10-CM | POA: Diagnosis not present

## 2016-06-17 DIAGNOSIS — H3562 Retinal hemorrhage, left eye: Secondary | ICD-10-CM | POA: Diagnosis not present

## 2016-06-17 DIAGNOSIS — H26053 Posterior subcapsular polar infantile and juvenile cataract, bilateral: Secondary | ICD-10-CM | POA: Diagnosis not present

## 2016-06-17 DIAGNOSIS — H43391 Other vitreous opacities, right eye: Secondary | ICD-10-CM | POA: Diagnosis not present

## 2016-06-17 DIAGNOSIS — H5213 Myopia, bilateral: Secondary | ICD-10-CM | POA: Diagnosis not present

## 2016-06-20 MED FILL — valACYclovir HCL 1 GM TABS: 1 | 4 days supply | Qty: 8 | Fill #1

## 2016-06-24 MED FILL — VENTOLIN HFA 90 MCG INHALER: 108 (90 BAS | 25 days supply | Qty: 18 | Fill #1

## 2016-06-29 DIAGNOSIS — Z6831 Body mass index (BMI) 31.0-31.9, adult: Secondary | ICD-10-CM | POA: Diagnosis not present

## 2016-06-29 DIAGNOSIS — Z01419 Encounter for gynecological examination (general) (routine) without abnormal findings: Secondary | ICD-10-CM | POA: Diagnosis not present

## 2016-06-29 MED FILL — FLUCONAZOLE 150 MG TABLET: 150 | 1 days supply | Qty: 1 | Fill #0

## 2016-06-29 MED FILL — metroNIDAZOLE 0.75 % GEL: 0.75 | 5 days supply | Qty: 70 | Fill #0

## 2016-06-30 DIAGNOSIS — K5090 Crohn's disease, unspecified, without complications: Secondary | ICD-10-CM | POA: Diagnosis not present

## 2016-06-30 DIAGNOSIS — H35372 Puckering of macula, left eye: Secondary | ICD-10-CM | POA: Diagnosis not present

## 2016-06-30 DIAGNOSIS — R194 Change in bowel habit: Secondary | ICD-10-CM | POA: Diagnosis not present

## 2016-06-30 DIAGNOSIS — H4312 Vitreous hemorrhage, left eye: Secondary | ICD-10-CM | POA: Diagnosis not present

## 2016-06-30 DIAGNOSIS — K210 Gastro-esophageal reflux disease with esophagitis: Secondary | ICD-10-CM | POA: Diagnosis not present

## 2016-06-30 DIAGNOSIS — H43812 Vitreous degeneration, left eye: Secondary | ICD-10-CM | POA: Diagnosis not present

## 2016-07-04 ENCOUNTER — Encounter: Payer: Self-pay | Admitting: Emergency Medicine

## 2016-07-04 ENCOUNTER — Ambulatory Visit (INDEPENDENT_AMBULATORY_CARE_PROVIDER_SITE_OTHER): Payer: 59 | Admitting: Emergency Medicine

## 2016-07-04 VITALS — BP 120/84 | HR 84 | Ht 66.0 in | Wt 196.0 lb

## 2016-07-04 DIAGNOSIS — J449 Chronic obstructive pulmonary disease, unspecified: Secondary | ICD-10-CM

## 2016-07-04 MED ORDER — BENZONATATE 200 MG PO CAPS: 200.0000 mg | ORAL_CAPSULE | Freq: Three times a day (TID) | ORAL | 1 refills | Status: DC | PRN

## 2016-07-04 MED ORDER — PREDNISONE 20 MG PO TABS: 40.0000 mg | ORAL_TABLET | Freq: Every day | ORAL | 0 refills | Status: DC

## 2016-07-04 MED FILL — predniSONE 20 MG TABS: 20 | 5 days supply | Qty: 10 | Fill #0

## 2016-07-04 MED FILL — BENZONATATE 200 MG CAPSULE: 200 | 30 days supply | Qty: 90 | Fill #0

## 2016-07-04 NOTE — Progress Notes (Signed)
Subjective:    Patient ID: Unknown Foley, female    DOB: 05-Apr-1956, 60 y.o.   MRN: [id number]  Asthma  She complains of cough and wheezing. There is no shortness of breath. Pertinent negatives include no ear pain, fever, headaches, postnasal drip, rhinorrhea, sneezing, sore throat or trouble swallowing. Her past medical history is significant for asthma.   60 year old woman, former smoker (40-pack-year), with a history of hypertension. She been seen before for possible asthma / cough by Dr Melvyn Novas, improved when treated for GERD. May have benefited from [facility] at that time. She had repeat pulmonary function testing performed on 06/18/15 that I personally reviewed. This shows moderate obstruction with a borderline bronchodilator response, normal lung volumes and diffusion capacity.  She reports that she noticed more cough and shortness of breath, some chest discomfort, in Feb 2016 after a hip sgy. All are better but she still notices exertional SOB when she exercises, rides bike.   ROV 01/04/16 -- follow-up visit for COPD with asthmatic features. She has a borderline bronchodilator response on spirometry. In November we started albuterol.  She reports that she has been coughing, was exposed to URI. She was treated with steroid taper, symptomatic relief. She used the SABA before exercise and it did help her (wasn't enough to reverse the recent sx associated with her URI).    ROV 07/04/16 -- Patient has a history of COPD with a borderline bronchodilator response. We have been managing her with albuterol when necessary. She has been well until about 3 weeks ago when she developed sore throat, then ear fullness, rhinitis and then cough. She tried taking loratadine. Her nasal drainage is now improved. She has taken albuterol with some improvement in cough. Occasional wheeze. Hasn';t been able to exert. She is left with persistent cough.    Review of Systems  Constitutional: Negative for fever and unexpected  weight change.  HENT: Negative for congestion, dental problem, ear pain, nosebleeds, postnasal drip, rhinorrhea, sinus pressure, sneezing, sore throat and trouble swallowing.   Eyes: Negative for redness and itching.  Respiratory: Positive for cough and wheezing. Negative for chest tightness and shortness of breath.   Cardiovascular: Negative for palpitations and leg swelling.  Gastrointestinal: Negative for nausea and vomiting.  Genitourinary: Negative for dysuria.  Musculoskeletal: Negative for joint swelling.  Skin: Negative for rash.  Neurological: Negative for headaches.  Hematological: Does not bruise/bleed easily.  Psychiatric/Behavioral: Negative for dysphoric mood. The patient is not nervous/anxious.        Objective:   Physical Exam Vitals:   07/04/16 1520  BP: 120/84  Pulse: 84  SpO2: 96%  Weight: 196 lb (88.9 kg)  Height: 5\' 6"  (1.676 m)   Gen: Pleasant, well-nourished, in no distress,  normal affect  ENT: No lesions,  mouth clear,  oropharynx clear, no postnasal drip  Neck: No JVD, no TMG, no carotid bruits  Lungs: No use of accessory muscles, clear without rales or rhonchi  Cardiovascular: RRR, heart sounds normal, no murmur or gallops, no peripheral edema  Musculoskeletal: No deformities, no cyanosis or clubbing  Neuro: alert, non focal  Skin: Warm, no lesions or rashes      Assessment & Plan:  Intrinsic asthma Asthma appears to be well compensated at this time. She does not have wheezing on exam. Her symptoms appear to be upper airway cough brought on by URI  Cough Initiated by upper respiratory infection and now being sustained by rhinitis. I'll will add loratadine to her chlorpheniramine. Short burst  of prednisone. Tessalon when necessary.  Baltazar Apo, MD, PhD 07/04/2016, 3:50 PM [facility] [phone] or if no answer [phone]

## 2016-07-04 NOTE — Assessment & Plan Note (Signed)
Initiated by upper respiratory infection and now being sustained by rhinitis. I'll will add loratadine to her chlorpheniramine. Short burst of prednisone. Tessalon when necessary.

## 2016-07-04 NOTE — Patient Instructions (Signed)
Start loratadine 10mg  daily through the Fall. You can stop it this winter.  You may use chlorpheniramine 4mg  up to every 6 hours if needed for drainage and cough.  Take prednisone 40mg  daily for 5 days and then stop Use tessalon perles up to every 6 hours to suppress cough.  Take albuterol 2 puffs up to every 4 hours if needed for shortness of breath.  Get your flu shot in the fall.  Follow with Dr Lamonte Sakai in 12 months or sooner if you have any problems

## 2016-07-04 NOTE — Assessment & Plan Note (Signed)
Asthma appears to be well compensated at this time. She does not have wheezing on exam. Her symptoms appear to be upper airway cough brought on by URI

## 2016-07-06 MED FILL — FLUCONAZOLE 100 MG TABLET: 100 | 7 days supply | Qty: 7 | Fill #0

## 2016-07-11 MED FILL — valACYclovir HCL 1 GM TABS: 1 | 4 days supply | Qty: 8 | Fill #2

## 2016-07-22 ENCOUNTER — Telehealth: Payer: Self-pay | Admitting: Emergency Medicine

## 2016-07-22 NOTE — Telephone Encounter (Signed)
  Pt complains of worsening cough and no improvement since last OV. Requests OV asap with any MD. Appt scheduled on 07/25/16 at 2:15pm MR. Nothing further needed.

## 2016-07-25 ENCOUNTER — Encounter: Payer: Self-pay | Admitting: Internal Medicine

## 2016-07-25 ENCOUNTER — Ambulatory Visit (INDEPENDENT_AMBULATORY_CARE_PROVIDER_SITE_OTHER): Payer: 59 | Admitting: Internal Medicine

## 2016-07-25 ENCOUNTER — Ambulatory Visit (INDEPENDENT_AMBULATORY_CARE_PROVIDER_SITE_OTHER)
Admission: RE | Admit: 2016-07-25 | Discharge: 2016-07-25 | Disposition: A | Discharge status: Home / Self Care | Payer: 59 | Source: Ambulatory Visit | Attending: Internal Medicine | Admitting: Internal Medicine

## 2016-07-25 VITALS — BP 142/80 | HR 89 | Ht 66.0 in | Wt 196.0 lb

## 2016-07-25 DIAGNOSIS — R05 Cough: Secondary | ICD-10-CM | POA: Diagnosis not present

## 2016-07-25 DIAGNOSIS — R0982 Postnasal drip: Secondary | ICD-10-CM

## 2016-07-25 DIAGNOSIS — J387 Other diseases of larynx: Secondary | ICD-10-CM

## 2016-07-25 DIAGNOSIS — J45901 Unspecified asthma with (acute) exacerbation: Secondary | ICD-10-CM | POA: Diagnosis not present

## 2016-07-25 MED ORDER — FLUTICASONE PROPIONATE 50 MCG/ACT NA SUSP: 2.0000 | Freq: Every day | NASAL | 5 refills | Status: DC

## 2016-07-25 MED ORDER — FLUTICASONE FUROATE-VILANTEROL 200-25 MCG/INH IN AEPB: 1.0000 | INHALATION_SPRAY | Freq: Every day | RESPIRATORY_TRACT | 11 refills | Status: DC

## 2016-07-25 MED ORDER — PREDNISONE 10 MG PO TABS: ORAL_TABLET | ORAL | 0 refills | Status: DC

## 2016-07-25 MED FILL — BREO ELLIPTA 200-25 MCG INH: 200-25 | 30 days supply | Qty: 60 | Fill #0 | Status: TO

## 2016-07-25 MED FILL — FLUTICASONE PROP 50 MCG SPR: 50 | 30 days supply | Qty: 16 | Fill #0

## 2016-07-25 MED FILL — predniSONE 10 MG TABS: 10 | 8 days supply | Qty: 15 | Fill #0

## 2016-07-25 NOTE — Progress Notes (Signed)
Subjective:     Patient ID: Unknown Nicole [name], female   DOB: 09-Jul-1956, 60 y.o.   MRN: [id number]  HPI     OV 07/25/2016  Chief Complaint  Patient presents with  . Acute Visit    Pt last seen by RB on 8/21 and was given pred taper. Pt states her cough has worsened since last OV. Pt c/o prod cough with white mucus (once was yellow). Pt states she is unable to take a deep breath and becomes SOB with activity. Pt denies CP/tightness and f/c/s.      Acute visit for this 60 year old female who has a history of smoking 40 pack year quit smoking 16 years ago. She has a clear chest x-ray per report in 2016 and pulmonary function test with mild to moderate obstruction without broncho-dilator response and normal DLCO in 2016. She tells me that in July 2017 she had what she thought was strep throat. After that she said she had intense itching in her years in her throat area to the extent she felt like there were razor blades in her throat. She had intense itching and scratchy feeling in her throat along with for years along with some yellow drainage through her nose. After this she has had significant amount of cough and wheezing which she felt has come through her throat but not from her chest. She then saw Dr. Baltazar Apo on 07/04/2016 and he gave her a prednisone taper along with antihistamines. She says does not not help much. Subsequent to that she went to the [facility] and she had significant wheezing. She has voice recording of this and she played this for me. She clearly says that all the sounds are coming from her throat. She feels she is chronically progressively getting worse. Overall symptoms are moderate to severe. They're random. No clear aggravating or relieving factors. She is frustrated. She tried her mom;s advair disc since yesterday and it helped her  Exhaled nitric oxide test in our office today 07/25/2016 is 123 ppb and VERY ELEVATED    has a past medical history of Anxiety; Arthritis;  Asthma; Bronchitis; Complication of anesthesia; Difficulty sleeping; Family history of anesthesia complication; GERD (gastroesophageal reflux disease); H/O hiatal hernia; H/O vertigo; Heart murmur; Hypertension; Numbness in left leg; PONV (postoperative nausea and vomiting); and Stress incontinence.   reports that she quit smoking about 16 years ago. Her smoking use included Cigarettes. She has a 40.50 pack-year smoking history. She has never used smokeless tobacco.  Past Surgical History:  Procedure Laterality Date  . BACK SURGERY  2013  . CHOLECYSTECTOMY  03   laparoscopic  . LUMBAR LAMINECTOMY/DECOMPRESSION MICRODISCECTOMY  09/10/2012   Procedure: LUMBAR LAMINECTOMY/DECOMPRESSION MICRODISCECTOMY 1 LEVEL;  Surgeon: Kristeen Miss, MD;  Location: [facility];  Service: Neurosurgery;  Laterality: Left;  Left Lumbar Five-Sacral One Microdiscectomy  . 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 Hctz [Hydrochlorothiazide] Other (See Comments)    MUSCLE CRAMPING  . Metoclopramide Hcl Other (See Comments)    Cramping, anxiety and depression; "makes me crazy"  . Voltaren [Diclofenac Sodium] Other (See Comments)    Swelling in leg and became parched.   . Pristiq [Desvenlafaxine Succinate Er]   . Avelox [Moxifloxacin Hcl In Nacl] Nausea And Vomiting  . Clindamycin/Lincomycin Rash  . Flagyl [Metronidazole] Rash  .  Neosporin [Neomycin-Bacitracin Zn-Polymyx] Other (See Comments)    Blisters, rash    Immunization History  Administered Date(s) Administered  . Influenza Whole 07/16/2011  . Influenza,inj,Quad PF,36+ Mos 08/14/2015    Family History  Problem Relation Age of Onset  . Diabetes Father   . Stroke Father   . Hypertension Father   . Kidney disease Father   . Kidney disease Mother   . Asthma Mother    . Thyroid disease Mother   . Glaucoma Mother   . Hypertension Sister      Current Outpatient Prescriptions:  .  albuterol (PROVENTIL HFA;VENTOLIN HFA) 108 (90 BASE) MCG/ACT inhaler, Inhale 2 puffs into the lungs every 6 (six) hours as needed for wheezing or shortness of breath., Disp: 1 Inhaler, Rfl: 2 .  ALPRAZolam (XANAX) 0.5 MG tablet, Take 0.5 mg by mouth at bedtime as needed for sleep., Disp: , Rfl:  .  benzonatate (TESSALON) 200 MG capsule, Take 1 capsule (200 mg total) by mouth 3 (three) times daily as needed for cough., Disp: 90 capsule, Rfl: 1 .  diltiazem (CARDIZEM CD) 180 MG 24 hr capsule, Take 1 capsule (180 mg total) by mouth daily., Disp: 90 capsule, Rfl: 3 .  esomeprazole (NEXIUM) 40 MG capsule, Take 40 mg by mouth 2 (two) times daily. Pt can not take generic, Disp: , Rfl:  .  losartan (COZAAR) 100 MG tablet, Take 1 tablet (100 mg total) by mouth daily., Disp: 90 tablet, Rfl: 3    Review of Systems     Objective:   Physical Exam  Constitutional: She is oriented to person, place, and time. She appears well-developed and well-nourished. No distress.  HENT:  Head: Normocephalic and atraumatic.  Right Ear: External ear normal.  Left Ear: External ear normal.  Mouth/Throat: Oropharynx is clear and moist. No oropharyngeal exudate.  Mild post nasal drip +  Eyes: Conjunctivae and EOM are normal. Pupils are equal, round, and reactive to light. Right eye exhibits no discharge. Left eye exhibits no discharge. No scleral icterus.  Neck: Normal range of motion. Neck supple. No JVD present. No tracheal deviation present. No thyromegaly present.  Cardiovascular: Normal rate, regular rhythm, normal heart sounds and intact distal pulses.  Exam reveals no gallop and no friction rub.   No murmur heard. Pulmonary/Chest: Effort normal and breath sounds normal. No respiratory distress. She has no wheezes. She has no rales. She exhibits no tenderness.  Abdominal: Soft. Bowel sounds are  normal. She exhibits no distension and no mass. There is no tenderness. There is no rebound and no guarding.  Musculoskeletal: Normal range of motion. She exhibits no edema or tenderness.  Lymphadenopathy:    She has no cervical adenopathy.  Neurological: She is alert and oriented to person, place, and time. She has normal reflexes. No cranial nerve deficit. She exhibits normal muscle tone. Coordination normal.  Skin: Skin is warm and dry. No rash noted. She is not diaphoretic. No erythema. No pallor.  Psychiatric: She has a normal mood and affect. Her behavior is normal. Judgment and thought content normal.  Vitals reviewed.   Vitals:   07/25/16 1434  BP: (!) 142/80  Pulse: 89  SpO2: 97%  Weight: 196 lb (88.9 kg)  Height: 5\' 6"  (1.676 m)   Estimated body mass index is 31.64 kg/m as calculated from the following:   Height as of this encounter: 5\' 6"  (1.676 m).   Weight as of this encounter: 196 lb (88.9 kg).  Assessment:       ICD-9-CM ICD-10-CM   1. Asthma with acute exacerbation, unspecified asthma severity 493.92 J45.901   2. Post-nasal drip 784.91 R09.82   3. 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 She might have associated irritable larynx because of the asthma itself postnasal drip. I have explained to her that we have treated the asthma first because of the elevated nitric oxide. After this we will assess for residual symptoms and tackle her irritable larynx if it is still persistent and several weeks from now. Therefore plan is as below     Plan:      IM depot medrol 80mg  x 1 in office Albuterol neb in office CXR 2 view 07/25/2016 Start and Take prednisone 40 mg daily x 2 days, then 20mg  daily x 2 days, then 10mg  daily x 2 days, then 5mg  daily x 2 days and stop Start Breo high dose daily for asthma daily without fail  Start / take generic fluticasone inhaler 2 squirts each nostril  daily Use albuterol as needed Can do flu shot in 2 weeks if well  Followup 4 weeks with myself or Dr Lamonte Sakai or NP   - FeNO test in office  - evaluate improvement and decide on measures to tackle irritable throat at followup if is still a problem  Dr. Brand Males, M.D., [facility].C.P Pulmonary and Critical Care Medicine Staff Physician [facility] Pager: [phone], If no answer or between  15:00h - 7:00h: call [phone]  07/25/2016 3:13 PM

## 2016-07-25 NOTE — Patient Instructions (Signed)
ICD-9-CM ICD-10-CM   1. Asthma with acute exacerbation, unspecified asthma severity 493.92 J45.901   2. Post-nasal drip 784.91 R09.82   3. 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

## 2016-07-26 ENCOUNTER — Telehealth: Payer: Self-pay | Admitting: Internal Medicine

## 2016-07-26 DIAGNOSIS — B009 Herpesviral infection, unspecified: Secondary | ICD-10-CM | POA: Diagnosis not present

## 2016-07-26 DIAGNOSIS — Z111 Encounter for screening for respiratory tuberculosis: Secondary | ICD-10-CM | POA: Diagnosis not present

## 2016-07-26 MED ORDER — METHYLPREDNISOLONE ACETATE 80 MG/ML IJ SUSP
80.0000 mg | Freq: Once | INTRAMUSCULAR | Status: AC
  Administered 2016-07-25: 80 mg via INTRAMUSCULAR

## 2016-07-26 MED ORDER — LEVALBUTEROL HCL 0.63 MG/3ML IN NEBU
0.6300 mg | INHALATION_SOLUTION | Freq: Once | RESPIRATORY_TRACT | Status: AC
  Administered 2016-07-25: 0.63 mg via RESPIRATORY_TRACT

## 2016-07-26 NOTE — Addendum Note (Signed)
Addended by: Collier Salina on: 07/26/2016 11:34 AM   Modules accepted: Orders

## 2016-07-26 NOTE — Telephone Encounter (Signed)
Results have been explained to patient, pt expressed understanding.   Notes Recorded by Brand Males, MD on 07/26/2016 at 6:24 AM EDT cxr is clear other than mild change c/w her brnchitis  Pt requests a letter to return to work - pt is applying for new job and needs a letter releasing her to work .  Pt is going to check with OBGYN Dr Candie Mile. Will await a call back from pt.

## 2016-07-26 NOTE — Progress Notes (Signed)
lmtcb for pt.  

## 2016-07-26 NOTE — Telephone Encounter (Signed)
Pt states that she does not need a letter from Dr Chase Caller - her OBGYN has written the letter. Nothing further needed.

## 2016-07-26 NOTE — Telephone Encounter (Signed)
Patient states that she found another physician to write her note to return to work - pr

## 2016-07-27 LAB — NITRIC OXIDE: NITRIC OXIDE: 123

## 2016-07-27 NOTE — Addendum Note (Signed)
Addended by: Collier Salina on: 07/27/2016 12:10 PM   Modules accepted: Orders

## 2016-07-28 MED FILL — valACYclovir HCL 1 GM TABS: 1 | 2 days supply | Qty: 8 | Fill #0

## 2016-07-28 MED FILL — CARTIA XT 180 MG CAPSULE SA: 180 | 90 days supply | Qty: 90 | Fill #1

## 2016-07-28 MED FILL — LOSARTAN POTASSIUM 100 MG T: 100 | 90 days supply | Qty: 90 | Fill #1

## 2016-07-29 MED FILL — valACYclovir HCL 1 GM TABS: 1 | 90 days supply | Qty: 90 | Fill #0

## 2016-07-29 NOTE — Progress Notes (Signed)
Called and spoke to pt. Informed her of the results and recs per MR. Pt verbalized understanding and denied any further questions or concerns at this time.   

## 2016-08-04 IMAGING — MR MR LUMBAR SPINE WO/W CM
4 of 7 series · 19 of 48 positions shown · IV contrast (Yes   mh)
Comparison: 04/16/2013

CLINICAL DATA: Left lumbar radiculopathy. Low back injury. Left leg
pain and cramping. Prior lumbar surgery in 3105.

EXAM:
MRI LUMBAR SPINE WITHOUT AND WITH CONTRAST
TECHNIQUE: Multiplanar and multiecho pulse sequences of the lumbar spine were
obtained without and with intravenous contrast.
CONTRAST:  18mL MULTIHANCE GADOBENATE DIMEGLUMINE 529 MG/ML IV SOLN

[Series 3: T2 · sagittal · 4.0mm · 0.51mm/px · 3 of 13 slices shown (1 of 2)]
[im 1/13]
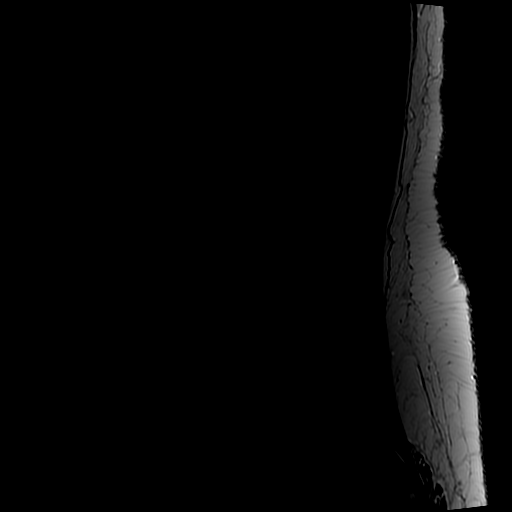
[im 7/13]
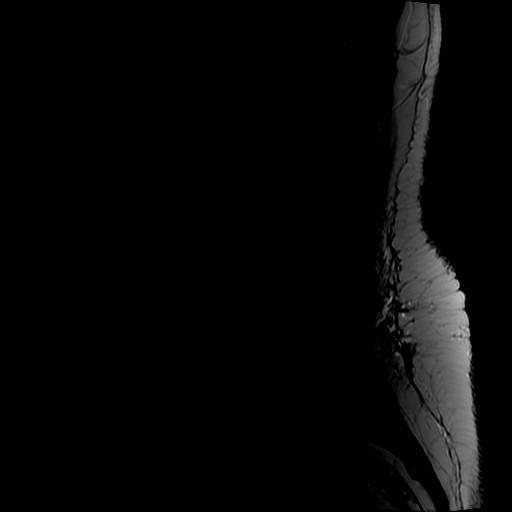
[im 13/13]
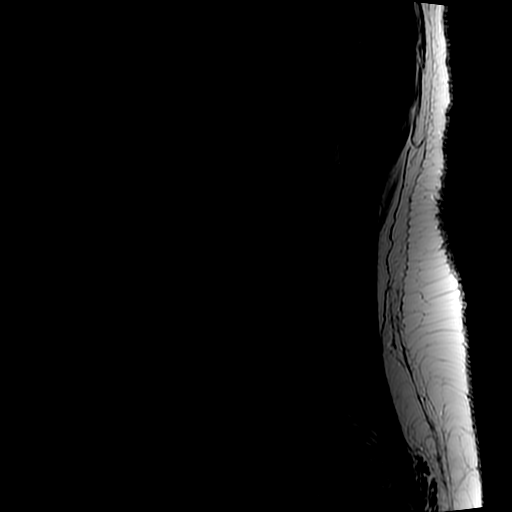

[Series 5: T1 · sagittal · 4.0mm · 0.51mm/px · 3 of 13 slices shown (1 of 2)]
[im 1/13]
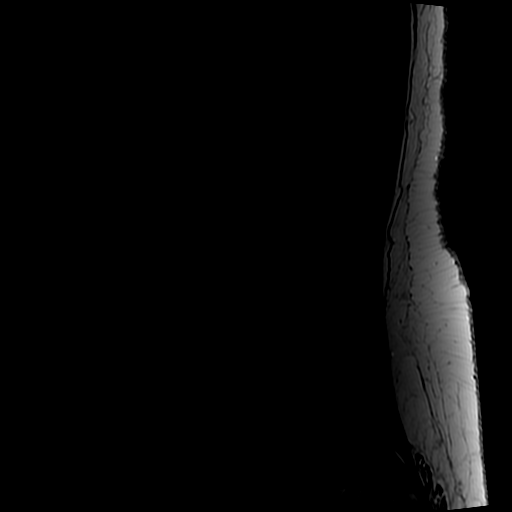
[im 9/13]
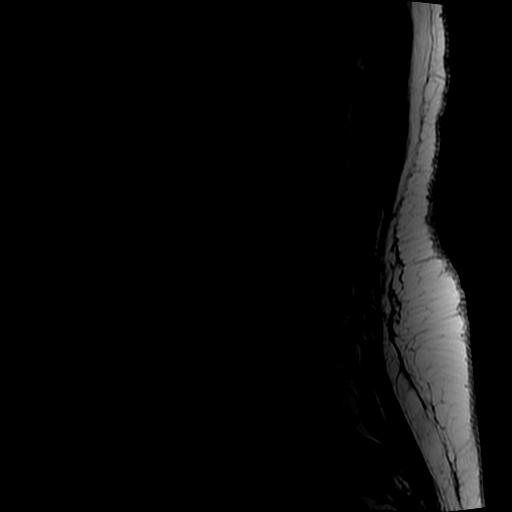
[im 13/13]
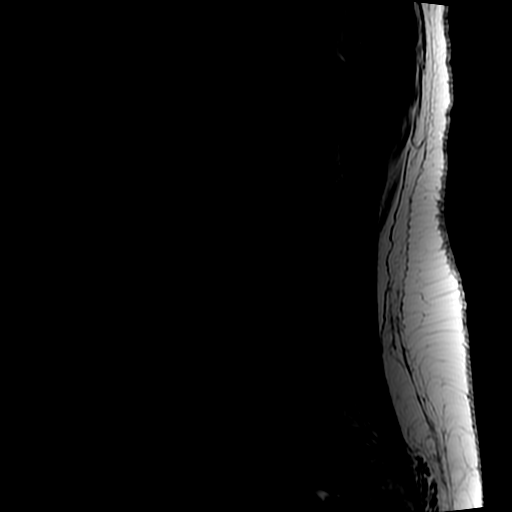

[Series 6: T2 · axial · 4.0mm · 0.39mm/px · z∈[+0,+198]mm · 10 of 38 slices shown (2 of 2)]
[im 1/38]
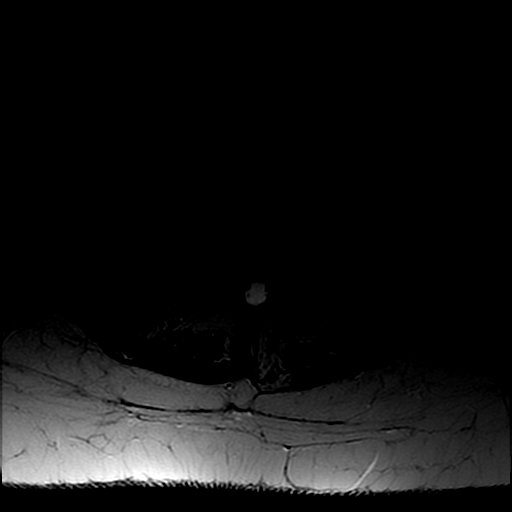
[im 4/38]
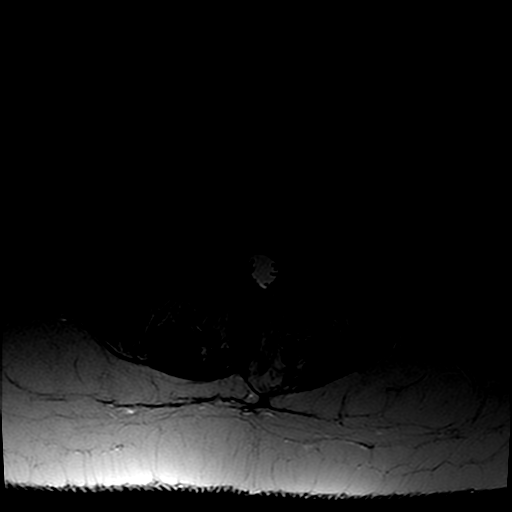
[im 8/38]
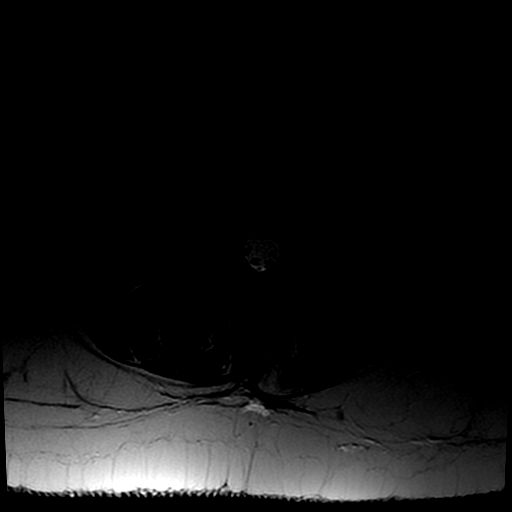
[im 12/38]
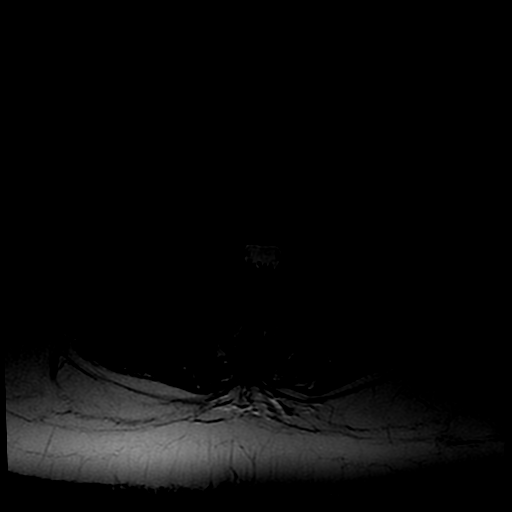
[im 15/38]
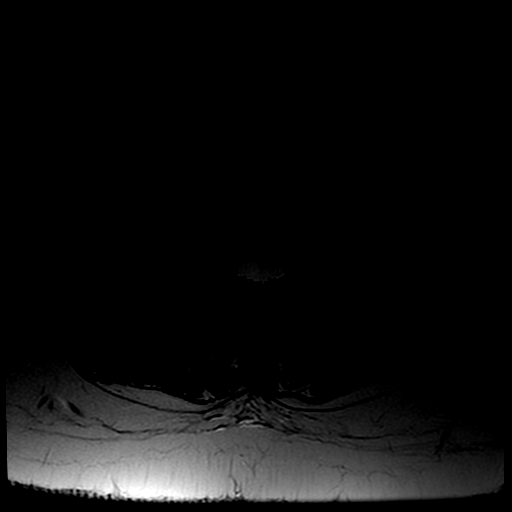
[im 19/38]
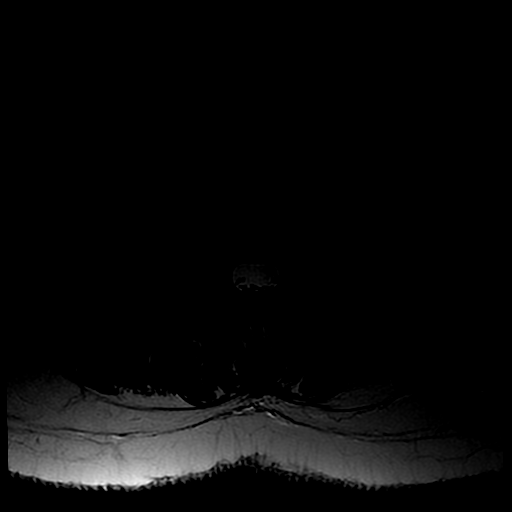
[im 23/38]
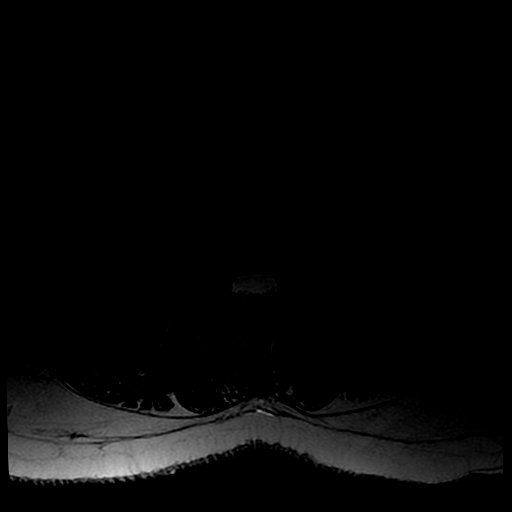
[im 26/38]
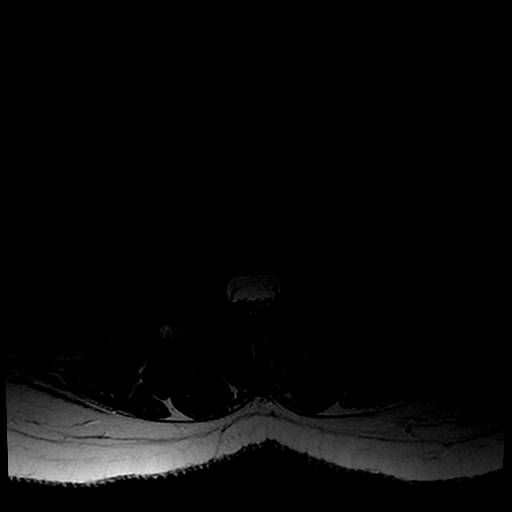
[im 30/38]
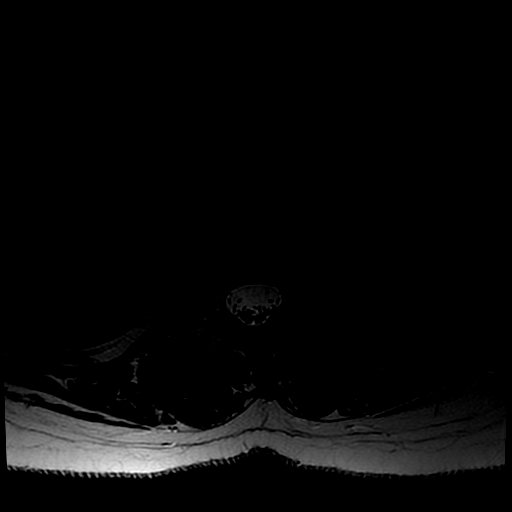
[im 34/38]
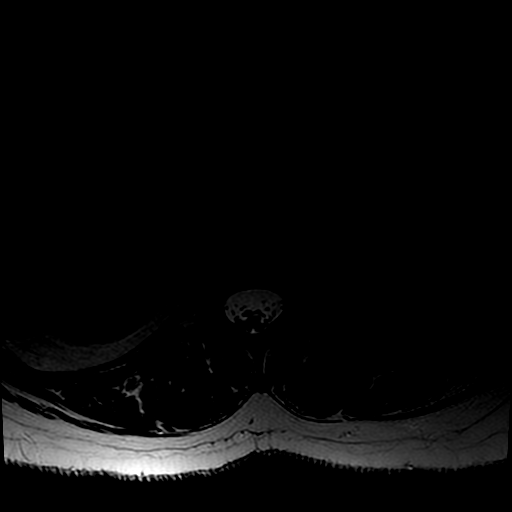

[Series 7: T1 · axial · 4.0mm · 0.39mm/px · z∈[+18,+198]mm · 3 of 38 slices shown (2 of 2)]
[im 4/38]
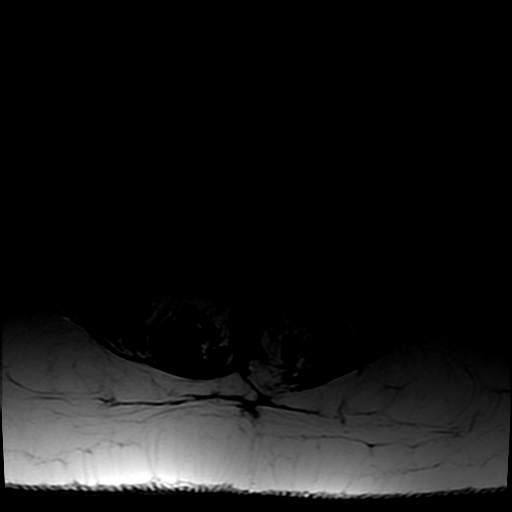
[im 19/38]
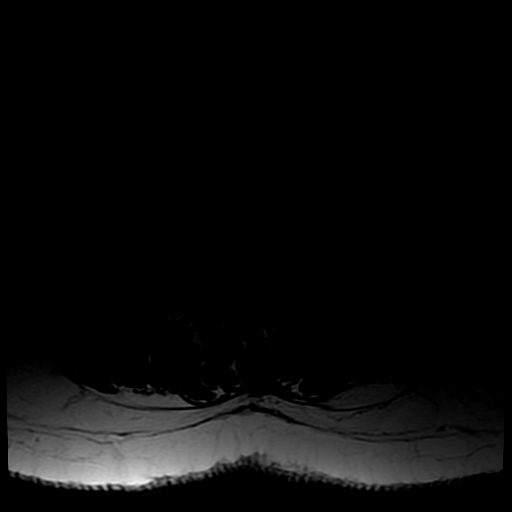
[im 34/38]
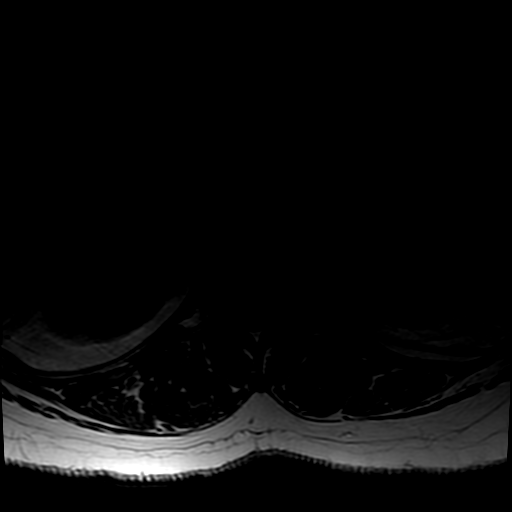

[19 of 48 positions shown; findings below may reference images not displayed]

FINDINGS: Lumbar numbering continued from prior MRI with lowest fully formed
disc space labeled L5-S1. Vestigial disc again noted at S1-2.
Vertebral body heights are preserved. Disc desiccation and minimal
disc space narrowing are present at L4-5 and L5-S1. Conus medullaris
is normal in signal and terminates at L1. There is no evidence of
vertebral marrow edema. Paraspinal soft tissues are unremarkable.

T12-L1 through L2-3: Negative.

L3-4: Minimal disc bulging asymmetric to the left and mild facet
hypertrophy without stenosis.

L4-5: Mild circumferential disc bulging and mild-to-moderate facet
and ligamentum flavum hypertrophy without stenosis, unchanged.

L5-S1: Sequelae of prior left hemilaminectomy are again identified.
Mild enhancement in the epidural space on the left including in the
left lateral recess likely represents postoperative granulation
tissue. Shallow residual left central disc protrusion does not
appear significantly changed without spinal canal or significant
residual left lateral recess stenosis. Facet hypertrophy results in
mild left neural foraminal narrowing, unchanged.
IMPRESSION: 1. Postoperative changes at L5-S1 with unchanged, mild left neural
foraminal narrowing due to facet arthrosis.
2. Unchanged, mild disc degeneration and facet arthrosis elsewhere
without stenosis.

## 2016-08-16 MED FILL — BREO ELLIPTA 200-25 MCG INH: 200-25 | 30 days supply | Qty: 60 | Fill #0

## 2016-08-23 ENCOUNTER — Telehealth: Payer: Self-pay | Admitting: Internal Medicine

## 2016-08-23 NOTE — Telephone Encounter (Signed)
Ask her to continue thr Flonase, but to try to stop the [facility] and just have albuterol available to use prn. She may be able to get away with this now that the flare is gone. If she fees that she back-tracks then we can go back to the breo

## 2016-08-23 NOTE — Telephone Encounter (Signed)
LM x 1 

## 2016-08-23 NOTE — Telephone Encounter (Signed)
Spoke with pt.  She seen MR on 07/25/16 for asthma exacerbation.  Was started on Breo and Flonase.  Pt states she is much better.  Not coughing or wheezing.  She wants to know if she should continue using Breo and Flonase since she is doing well on these.  She canceled her appt for f/u since she was feeling so much better.  Please advise.

## 2016-08-24 NOTE — Telephone Encounter (Signed)
Called and spoke with pt and she is aware of RB recs.  Nothing further is needed.  

## 2016-08-25 ENCOUNTER — Ambulatory Visit: Payer: Self-pay | Admitting: Internal Medicine

## 2016-08-25 MED FILL — GAVILYTE-N SOLUTION: 420 | 1 days supply | Qty: 4000 | Fill #0

## 2016-08-25 MED FILL — NexIUM 40 MG CPDR: 40 | 90 days supply | Qty: 180 | Fill #0 | Status: TO

## 2016-08-25 MED FILL — FLUCONAZOLE 150 MG TABLET: 150 | 1 days supply | Qty: 1 | Fill #0

## 2016-08-30 DIAGNOSIS — K635 Polyp of colon: Secondary | ICD-10-CM | POA: Diagnosis not present

## 2016-08-30 DIAGNOSIS — D125 Benign neoplasm of sigmoid colon: Secondary | ICD-10-CM | POA: Diagnosis not present

## 2016-08-30 DIAGNOSIS — S8391XA Sprain of unspecified site of right knee, initial encounter: Secondary | ICD-10-CM | POA: Diagnosis not present

## 2016-08-30 DIAGNOSIS — K6389 Other specified diseases of intestine: Secondary | ICD-10-CM | POA: Diagnosis not present

## 2016-08-30 DIAGNOSIS — K219 Gastro-esophageal reflux disease without esophagitis: Secondary | ICD-10-CM | POA: Diagnosis not present

## 2016-08-30 DIAGNOSIS — K449 Diaphragmatic hernia without obstruction or gangrene: Secondary | ICD-10-CM | POA: Diagnosis not present

## 2016-08-30 DIAGNOSIS — K633 Ulcer of intestine: Secondary | ICD-10-CM | POA: Diagnosis not present

## 2016-08-30 DIAGNOSIS — K2950 Unspecified chronic gastritis without bleeding: Secondary | ICD-10-CM | POA: Diagnosis not present

## 2016-08-30 DIAGNOSIS — K317 Polyp of stomach and duodenum: Secondary | ICD-10-CM | POA: Diagnosis not present

## 2016-08-30 DIAGNOSIS — K648 Other hemorrhoids: Secondary | ICD-10-CM | POA: Diagnosis not present

## 2016-08-30 DIAGNOSIS — Z1211 Encounter for screening for malignant neoplasm of colon: Secondary | ICD-10-CM | POA: Diagnosis not present

## 2016-09-09 DIAGNOSIS — H4312 Vitreous hemorrhage, left eye: Secondary | ICD-10-CM | POA: Diagnosis not present

## 2016-09-09 DIAGNOSIS — H59022 Cataract (lens) fragments in eye following cataract surgery, left eye: Secondary | ICD-10-CM | POA: Diagnosis not present

## 2016-10-14 DIAGNOSIS — S838X1D Sprain of other specified parts of right knee, subsequent encounter: Secondary | ICD-10-CM | POA: Diagnosis not present

## 2016-10-24 MED FILL — LOSARTAN POTASSIUM 100 MG T: 100 | 90 days supply | Qty: 90 | Fill #2

## 2016-10-26 DIAGNOSIS — M5416 Radiculopathy, lumbar region: Secondary | ICD-10-CM | POA: Diagnosis not present

## 2016-10-26 DIAGNOSIS — S838X1D Sprain of other specified parts of right knee, subsequent encounter: Secondary | ICD-10-CM | POA: Diagnosis not present

## 2016-10-26 MED FILL — HYDROCODON-APAP 5-325: 5-325 | 15 days supply | Qty: 60 | Fill #0

## 2016-10-26 MED FILL — IBUPROFEN 800 MG TABLET: 800 | 30 days supply | Qty: 90 | Fill #0

## 2016-10-31 DIAGNOSIS — H35433 Paving stone degeneration of retina, bilateral: Secondary | ICD-10-CM | POA: Diagnosis not present

## 2016-10-31 DIAGNOSIS — H43813 Vitreous degeneration, bilateral: Secondary | ICD-10-CM | POA: Diagnosis not present

## 2016-10-31 DIAGNOSIS — H35372 Puckering of macula, left eye: Secondary | ICD-10-CM | POA: Diagnosis not present

## 2016-10-31 MED FILL — CARTIA XT 180 MG CAPSULE SA: 180 | 90 days supply | Qty: 90 | Fill #2

## 2016-11-11 ENCOUNTER — Telehealth: Payer: Self-pay | Admitting: Emergency Medicine

## 2016-11-11 NOTE — Telephone Encounter (Signed)
Called and spoke with pt and she was wanting to know how serious is it if you aspirate a small amount of the barium from a barium test?  Pt was told that the barium just kinda sits around in your body and pt was concerned about this.  RB please advise. thanks

## 2016-11-16 NOTE — Telephone Encounter (Signed)
RB please advise. Thanks.  

## 2016-11-17 DIAGNOSIS — M23321 Other meniscus derangements, posterior horn of medial meniscus, right knee: Secondary | ICD-10-CM | POA: Diagnosis not present

## 2016-11-17 DIAGNOSIS — M23351 Other meniscus derangements, posterior horn of lateral meniscus, right knee: Secondary | ICD-10-CM | POA: Diagnosis not present

## 2016-11-17 DIAGNOSIS — M1711 Unilateral primary osteoarthritis, right knee: Secondary | ICD-10-CM | POA: Diagnosis not present

## 2016-11-21 DIAGNOSIS — Z471 Aftercare following joint replacement surgery: Secondary | ICD-10-CM | POA: Diagnosis not present

## 2016-11-21 DIAGNOSIS — Z96642 Presence of left artificial hip joint: Secondary | ICD-10-CM | POA: Diagnosis not present

## 2016-11-21 MED FILL — predniSONE 5 MG TABS: 5 | 12 days supply | Qty: 21 | Fill #0

## 2016-11-21 NOTE — Telephone Encounter (Signed)
Spoke with pt,aware of recs.  Nothing further needed.  

## 2016-11-21 NOTE — Telephone Encounter (Signed)
I do not believe this is any harm to her. The barium is an intentionally benign substance. It may take some time for her body to absorb the barium, but it should not do her any harm.

## 2016-11-23 DIAGNOSIS — I872 Venous insufficiency (chronic) (peripheral): Secondary | ICD-10-CM | POA: Diagnosis not present

## 2016-11-23 DIAGNOSIS — H52223 Regular astigmatism, bilateral: Secondary | ICD-10-CM | POA: Diagnosis not present

## 2016-11-23 DIAGNOSIS — H25813 Combined forms of age-related cataract, bilateral: Secondary | ICD-10-CM | POA: Diagnosis not present

## 2016-11-23 DIAGNOSIS — H35372 Puckering of macula, left eye: Secondary | ICD-10-CM | POA: Diagnosis not present

## 2016-11-23 DIAGNOSIS — H5213 Myopia, bilateral: Secondary | ICD-10-CM | POA: Diagnosis not present

## 2016-11-25 MED FILL — ALPRAZolam 0.5 MG TABS: 0.5 | 90 days supply | Qty: 360 | Fill #0

## 2016-11-29 DIAGNOSIS — M25552 Pain in left hip: Secondary | ICD-10-CM | POA: Diagnosis not present

## 2016-12-01 MED FILL — IBUPROFEN 800 MG TABLET: 800 | 30 days supply | Qty: 90 | Fill #1

## 2016-12-01 MED FILL — valACYclovir HCL 1 GM TABS: 1 | 90 days supply | Qty: 90 | Fill #1

## 2016-12-02 MED FILL — ESOMEPRAZOLE MAG DR 40 MG C: 40 | 90 days supply | Qty: 180 | Fill #0

## 2016-12-14 DIAGNOSIS — I8311 Varicose veins of right lower extremity with inflammation: Secondary | ICD-10-CM | POA: Diagnosis not present

## 2016-12-14 DIAGNOSIS — I872 Venous insufficiency (chronic) (peripheral): Secondary | ICD-10-CM | POA: Diagnosis not present

## 2016-12-14 DIAGNOSIS — D485 Neoplasm of uncertain behavior of skin: Secondary | ICD-10-CM | POA: Diagnosis not present

## 2016-12-14 DIAGNOSIS — I8312 Varicose veins of left lower extremity with inflammation: Secondary | ICD-10-CM | POA: Diagnosis not present

## 2016-12-14 DIAGNOSIS — L570 Actinic keratosis: Secondary | ICD-10-CM | POA: Diagnosis not present

## 2016-12-14 DIAGNOSIS — L438 Other lichen planus: Secondary | ICD-10-CM | POA: Diagnosis not present

## 2016-12-14 MED FILL — TRIAMCINOLONE 0.1% CREAM: 0.1 | 30 days supply | Qty: 80 | Fill #0

## 2016-12-15 MED FILL — CYCLOBENZAPRINE 10 MG TAB: 10 | 16 days supply | Qty: 50 | Fill #0

## 2016-12-21 MED FILL — predniSONE 5 MG TABS: 5 | 30 days supply | Qty: 100 | Fill #0

## 2017-01-05 ENCOUNTER — Telehealth: Payer: Self-pay | Admitting: Emergency Medicine

## 2017-01-05 ENCOUNTER — Ambulatory Visit: Payer: Self-pay | Admitting: Adult Health

## 2017-01-05 MED FILL — DOXYCYCLINE HYCLATE 100 MG: 100 | 10 days supply | Qty: 20 | Fill #0

## 2017-01-05 MED FILL — FLUTICASONE PROP 50 MCG SPR: 50 | 30 days supply | Qty: 16 | Fill #1

## 2017-01-05 MED FILL — BREO ELLIPTA 200-25 MCG INH: 200-25 | 30 days supply | Qty: 60 | Fill #1

## 2017-01-05 NOTE — Telephone Encounter (Signed)
Called and spoke with pt and she is aware of RB recs.  She stated that she has already started the pred.  She is also using the nasal spray.

## 2017-01-05 NOTE — Telephone Encounter (Signed)
I would discourage her from taking the prednisone without seeing [place] or at least discussing sx with [place] first.  If she were to take a pred burst for sx management then she should use 25mg  daily for 5 -7 days and then stop.

## 2017-01-05 NOTE — Telephone Encounter (Signed)
Spoke with pt. States that was seen by her PCP today for a routine check up. Pt is starting to have issues with chest tightness and coughing. Her PCP gave her an antibiotic to take with her on her trip to [place] in case she gets more sick than she is now. Pt is prednisone on hand that she wants to take with her as well but she doesn't know what "recipe" to use if she does need to take it. She has #100+ plus 5mg  tablets. Pt did not want to schedule an appointment to be seen.  RB - please advise. Thanks.

## 2017-01-18 ENCOUNTER — Encounter: Payer: Self-pay | Admitting: Adult Health

## 2017-01-18 ENCOUNTER — Ambulatory Visit (INDEPENDENT_AMBULATORY_CARE_PROVIDER_SITE_OTHER)
Admission: RE | Admit: 2017-01-18 | Discharge: 2017-01-18 | Disposition: A | Discharge status: Home / Self Care | Payer: BLUE CROSS/BLUE SHIELD | Source: Ambulatory Visit | Attending: Adult Health | Admitting: Adult Health

## 2017-01-18 ENCOUNTER — Ambulatory Visit (INDEPENDENT_AMBULATORY_CARE_PROVIDER_SITE_OTHER): Payer: BLUE CROSS/BLUE SHIELD | Admitting: Adult Health

## 2017-01-18 DIAGNOSIS — J4531 Mild persistent asthma with (acute) exacerbation: Secondary | ICD-10-CM

## 2017-01-18 MED ORDER — LEVALBUTEROL HCL 0.63 MG/3ML IN NEBU
0.6300 mg | INHALATION_SOLUTION | Freq: Once | RESPIRATORY_TRACT | Status: AC
  Administered 2017-01-18: 0.63 mg via RESPIRATORY_TRACT

## 2017-01-18 MED FILL — LOSARTAN POTASSIUM 100 MG T: 100 | 90 days supply | Qty: 90 | Fill #3

## 2017-01-18 NOTE — Progress Notes (Signed)
@Patient  ID: Unknown Foley, female    DOB: 1956-01-23, 61 y.o.   MRN: [id number]  Chief Complaint  Patient presents with  . Acute Visit    Cough     Referring provider: Gaynelle Arabian, MD  HPI: 61 yo female former smoker followed for asthma   01/18/2017 Acute OV : Bronchitis  Pt presents for an acute office visit. Complains of cough ,congestion, sinus drainage , wheezing and tightness for last 2 weeks.  She went to [place] last week.  Seen at urgent care on 3/4 , started on Doxycycline and Prednisone . Starting to feel better.  But still has cough and congestion .  No fever or hemoptysis.     Allergies  Allergen Reactions  . Hctz [Hydrochlorothiazide] Other (See Comments)    MUSCLE CRAMPING  . Metoclopramide Hcl Other (See Comments)    Cramping, anxiety and depression; "makes me crazy"  . Voltaren [Diclofenac Sodium] Other (See Comments)    Swelling in leg and became parched.   . Pristiq [Desvenlafaxine Succinate Er]   . Avelox [Moxifloxacin Hcl In Nacl] Nausea And Vomiting  . Clindamycin/Lincomycin Rash  . Flagyl [Metronidazole] Rash  . Neosporin [Neomycin-Bacitracin Zn-Polymyx] Other (See Comments)    Blisters, rash    Immunization History  Administered Date(s) Administered  . Influenza Whole 07/16/2011  . Influenza,inj,Quad PF,36+ Mos 08/14/2015    Past Medical History:  Diagnosis Date  . Anxiety   . Arthritis    lumbar- HNP, /w myelopathy  . Asthma    /w reflux , consult /w Dr. Wert-2013 / USES INHALER PRN INFREQUENTLY  . Bronchitis    used albuterol , Sept. 2013, all clear now   . Complication of anesthesia   . Difficulty sleeping    DUE TO PAIN  . Family history of anesthesia complication    N&V- Mother   . GERD (gastroesophageal reflux disease)    Chron's disease, not medically treating currently   . H/O hiatal hernia    small hiatal hernia, has had endoscopy & colondoscopy  . H/O vertigo   . Heart murmur    since birth  . Hypertension    followed by Dr. Marisue Humble   . Numbness in left leg   . PONV (postoperative nausea and vomiting)   . Stress incontinence     Tobacco History: History  Smoking Status  . Former Smoker  . Packs/day: 1.50  . Years: 27.00  . Types: Cigarettes  . Quit date: 11/15/1999  Smokeless Tobacco  . Never Used   Counseling given: Not Answered   Outpatient Encounter Prescriptions as of 01/18/2017  Medication Sig  . albuterol (PROVENTIL HFA;VENTOLIN HFA) 108 (90 BASE) MCG/ACT inhaler Inhale 2 puffs into the lungs every 6 (six) hours as needed for wheezing or shortness of breath.  . ALPRAZolam (XANAX) 0.5 MG tablet Take 0.5 mg by mouth at bedtime as needed for sleep.  . benzonatate (TESSALON) 200 MG capsule Take 1 capsule (200 mg total) by mouth 3 (three) times daily as needed for cough.  . diltiazem (CARDIZEM CD) 180 MG 24 hr capsule Take 1 capsule (180 mg total) by mouth daily.  Marland Kitchen esomeprazole (NEXIUM) 40 MG capsule Take 40 mg by mouth 2 (two) times daily. Pt can not take generic  . fluticasone (FLONASE) 50 MCG/ACT nasal spray Place 2 sprays into both nostrils daily.  . fluticasone furoate-vilanterol (BREO ELLIPTA) 200-25 MCG/INH AEPB Inhale 1 puff into the lungs daily.  Marland Kitchen losartan (COZAAR) 100 MG tablet Take 1 tablet (  100 mg total) by mouth daily.  . predniSONE (DELTASONE) 10 MG tablet Take 40 mg daily x 2 days, then 20 mg daily x 2 days, then 10 mg x 2 days daily, then 5 mg daily for x 2 days, then stop   No facility-administered encounter medications on file as of 01/18/2017.      Review of Systems  Constitutional:   No  weight loss, night sweats,  Fevers, chills, fatigue, or  lassitude.  HEENT:   No headaches,  Difficulty swallowing,  Tooth/dental problems, or  Sore throat,                No sneezing, itching, ear ache,  +nasal congestion, post nasal drip,   CV:  No chest pain,  Orthopnea, PND, swelling in lower extremities, anasarca, dizziness, palpitations, syncope.   GI  No heartburn,  indigestion, abdominal pain, nausea, vomiting, diarrhea, change in bowel habits, loss of appetite, bloody stools.   Resp:    No chest wall deformity  Skin: no rash or lesions.  GU: no dysuria, change in color of urine, no urgency or frequency.  No flank pain, no hematuria   MS:  No joint pain or swelling.  No decreased range of motion.  No back pain.    Physical Exam  BP 122/78 (BP Location: Right Arm, Cuff Size: Normal)   Pulse 85   Ht 5\' 6"  (1.676 m)   Wt 196 lb 12.8 oz (89.3 kg)   SpO2 97%   BMI 31.76 kg/m   GEN: A/Ox3; pleasant , NAD, well nourished    HEENT:  [place]/AT,  EACs-clear, TMs-wnl, NOSE-clear drainage , THROAT-clear, no lesions, no postnasal drip or exudate noted.   NECK:  Supple w/ fair ROM; no JVD; normal carotid impulses w/o bruits; no thyromegaly or nodules palpated; no lymphadenopathy.    RESP  Clear  P & A; w/o, wheezes/ rales/ or rhonchi. no accessory muscle use, no dullness to percussion  CARD:  RRR, no m/r/g, no peripheral edema, pulses intact, no cyanosis or clubbing.  GI:   Soft & nt; nml bowel sounds; no organomegaly or masses detected.   Musco: Warm bil, no deformities or joint swelling noted.   Neuro: alert, no focal deficits noted.    Skin: Warm, no lesions or rashes   Lab Results:  BNP No results found for: BNP  ProBNP No results found for: PROBNP  Imaging: No results found.   Assessment & Plan:   Asthma with acute exacerbation AB exacerbation -resolving  Check cxr  xopenex neb tx x 1   Plan  Patient Instructions  Chest xray today .  Finish doxycycline and prednisone. Mucinex DM Twice daily  As needed  Cough /congestion .  Saline nasal rinses As needed   Claritin 10mg  At bedtime  As needed  Drainage .  Flonase daily As needed  Nasal congestion  Follow up with Dr. Lamonte Sakai in 3 months and As needed  .  Please contact office for sooner follow up if symptoms do not improve or worsen or seek emergency care         Rexene Edison, NP 01/18/2017

## 2017-01-18 NOTE — Assessment & Plan Note (Signed)
AB exacerbation -resolving  Check cxr  xopenex neb tx x 1   Plan  Patient Instructions  Chest xray today .  Finish doxycycline and prednisone. Mucinex DM Twice daily  As needed  Cough /congestion .  Saline nasal rinses As needed   Claritin 10mg  At bedtime  As needed  Drainage .  Flonase daily As needed  Nasal congestion  Follow up with Dr. Lamonte Sakai in 3 months and As needed  .  Please contact office for sooner follow up if symptoms do not improve or worsen or seek emergency care

## 2017-01-18 NOTE — Addendum Note (Signed)
Addended by: Parke Poisson E on: 01/18/2017 12:45 PM   Modules accepted: Orders

## 2017-01-18 NOTE — Patient Instructions (Signed)
Chest xray today .  Finish doxycycline and prednisone. Mucinex DM Twice daily  As needed  Cough /congestion .  Saline nasal rinses As needed   Claritin 10mg  At bedtime  As needed  Drainage .  Flonase daily As needed  Nasal congestion  Follow up with Dr. Lamonte Sakai in 3 months and As needed  .  Please contact office for sooner follow up if symptoms do not improve or worsen or seek emergency care

## 2017-01-20 NOTE — Progress Notes (Signed)
LMOMTCB x 1 

## 2017-01-23 ENCOUNTER — Telehealth: Payer: Self-pay | Admitting: Emergency Medicine

## 2017-01-23 NOTE — Telephone Encounter (Signed)
LMTCB

## 2017-01-24 ENCOUNTER — Telehealth: Payer: Self-pay | Admitting: Emergency Medicine

## 2017-01-24 NOTE — Telephone Encounter (Signed)
lmtcb x1 for pt. 

## 2017-01-24 NOTE — Telephone Encounter (Signed)
Notes Recorded by Melvenia Needles, NP on 01/19/2017 at 10:07 AM EST Lungs are clear , no sign of PNA  Cont w/ ov recs Please contact office for sooner follow up if symptoms do not improve or worsen or seek emergency care  -------- Spoke with pt, aware of results/recs.  Nothing further needed.

## 2017-01-24 NOTE — Telephone Encounter (Signed)
Patient called regarding results - she can be reached at [phone] - pt states okay to leave results on voicemail -pr

## 2017-01-25 NOTE — Telephone Encounter (Signed)
lmtcb for pt.  

## 2017-01-27 NOTE — Telephone Encounter (Signed)
Patient is returning phone call.  °

## 2017-01-27 NOTE — Telephone Encounter (Signed)
Spoke with pt. She had a question about her father, not her results. Her questions have been answered and addressed. Nothing further was needed.

## 2017-01-27 NOTE — Telephone Encounter (Signed)
lmomtcb x 3  

## 2017-02-08 MED FILL — CARTIA XT 180 MG CAPSULE SA: 180 | 90 days supply | Qty: 90 | Fill #3

## 2017-02-22 ENCOUNTER — Encounter: Payer: Self-pay | Admitting: Acute Care

## 2017-02-22 ENCOUNTER — Ambulatory Visit (INDEPENDENT_AMBULATORY_CARE_PROVIDER_SITE_OTHER): Payer: BLUE CROSS/BLUE SHIELD | Admitting: Acute Care

## 2017-02-22 VITALS — BP 140/80 | HR 84 | Temp 98.5°F | Ht 66.0 in | Wt 194.6 lb

## 2017-02-22 DIAGNOSIS — R05 Cough: Secondary | ICD-10-CM

## 2017-02-22 DIAGNOSIS — J387 Other diseases of larynx: Secondary | ICD-10-CM | POA: Diagnosis not present

## 2017-02-22 DIAGNOSIS — R059 Cough, unspecified: Secondary | ICD-10-CM

## 2017-02-22 LAB — NITRIC OXIDE: Nitric Oxide: 8

## 2017-02-22 MED ORDER — METHYLPREDNISOLONE ACETATE 80 MG/ML IJ SUSP
80.0000 mg | Freq: Once | INTRAMUSCULAR | Status: AC
  Administered 2017-02-22: 80 mg via INTRAMUSCULAR

## 2017-02-22 MED ORDER — PREDNISONE 10 MG PO TABS: ORAL_TABLET | ORAL | 0 refills | Status: DC

## 2017-02-22 MED FILL — predniSONE 10 MG TABS: 10 | 8 days supply | Qty: 20 | Fill #0

## 2017-02-22 NOTE — Patient Instructions (Addendum)
It is nice to meet you today. DepoMedrol 80 mg today We will check a FENO today Re-start Breo high dose, 1 puff once daily x 1 month ( Please give sample) Rinse mouth after use. Prednisone taper; 10 mg tablets: 4 tabs x 2 days, 3 tabs x 2 days, 2 tabs x 2 days 1 tab x 2 days then stop. Sips of water, no throat clearing Sugar free lemon heads or Jolly Ranchers for throat soothing. Zyrtec 10 mg daily for Post nasal drip. Avoid chocolate, mint, and menthol. Mucinex 1200 mg with water once daily. Follow up in 1 month with Dr. Lamonte Sakai on NP. Please contact office for sooner follow up if symptoms do not improve or worsen or seek emergency care

## 2017-02-22 NOTE — Assessment & Plan Note (Addendum)
Cough and PND She is not taking Zyrtec or using Breo Suspect laryngeal irritation/ Upper Airway Dysfunction Plan: DepoMedrol 80 mg today We will check a FENO today Re-start Breo high dose, 1 puff once daily x 1 month ( Please give sample) Rinse mouth after use. Prednisone taper; 10 mg tablets: 4 tabs x 2 days, 3 tabs x 2 days, 2 tabs x 2 days 1 tab x 2 days then stop. Sips of water, no throat clearing Sugar free lemon heads or Jolly Ranchers for throat soothing. Zyrtec 10 mg daily for Post nasal drip. Avoid chocolate, mint, and menthol. Mucinex 1200 mg with water once daily. GERD diet Continue Nexium Follow up in 1 month with Dr. Lamonte Sakai on NP. Please contact office for sooner follow up if symptoms do not improve or worsen or seek emergency care

## 2017-02-22 NOTE — Progress Notes (Signed)
History of Present Illness Nicole [name] is a 61 y.o. female former smoker( 40 pack years, quit 2001) with asthma. She is followed by Dr. Lamonte Sakai.   02/22/2017 Acute OV for Continued Bronchitis Pt. Presents with complaints of stuffy nose and cough. She were treated 12/16/16 with ABX  and pred taper. Per PCP.  She then took a trip to [place]. Upon return she was sick again, and saw her PCP and was re-treated with Doxy and prednisone taper. She was seen in the office by Rexene Edison, NP, who told patient to complete ABX and pred taper. CXR at that visit was normal. Flu was negative at PCP office. She has continued to cough, which she states is making her wheeze. She complains of post nasal gtt and sore throat. Nasal congestion and stuffiness. She is not taking Zyrtec, or using her albuterol.She is clearing her throat frequently. She went to urgent care at [facility] 02/19/2017. Sinus films were negative. She was given a sinus spray which she states does nothing.She is also complaining of chest congestion.She denies fever, chest pain, orthopnea or hemoptysis.   Tests:  PFT:  mild to moderate obstruction without broncho-dilator response and normal DLCO in 2016.  FENO  02/22/2017 = 8  Past medical hx Past Medical History:  Diagnosis Date  . Anxiety   . Arthritis    lumbar- HNP, /w myelopathy  . Asthma    /w reflux , consult /w Dr. Wert-2013 / USES INHALER PRN INFREQUENTLY  . Bronchitis    used albuterol , Sept. 2013, all clear now   . Complication of anesthesia   . Difficulty sleeping    DUE TO PAIN  . Family history of anesthesia complication    N&V- Mother   . GERD (gastroesophageal reflux disease)    Chron's disease, not medically treating currently   . H/O hiatal hernia    small hiatal hernia, has had endoscopy & colondoscopy  . H/O vertigo   . Heart murmur    since birth  . Hypertension    followed by Dr. Marisue Humble   . Numbness in left leg   . PONV (postoperative nausea and vomiting)     . Stress incontinence      Past surgical hx, Family hx, Social hx all reviewed.  Current Outpatient Prescriptions on File Prior to Visit  Medication Sig  . albuterol (PROVENTIL HFA;VENTOLIN HFA) 108 (90 BASE) MCG/ACT inhaler Inhale 2 puffs into the lungs every 6 (six) hours as needed for wheezing or shortness of breath.  . diltiazem (CARDIZEM CD) 180 MG 24 hr capsule Take 1 capsule (180 mg total) by mouth daily.  Marland Kitchen esomeprazole (NEXIUM) 40 MG capsule Take 40 mg by mouth 2 (two) times daily. Pt can not take generic  . fluticasone (FLONASE) 50 MCG/ACT nasal spray Place 2 sprays into both nostrils daily.  . fluticasone furoate-vilanterol (BREO ELLIPTA) 200-25 MCG/INH AEPB Inhale 1 puff into the lungs daily.  Marland Kitchen losartan (COZAAR) 100 MG tablet Take 1 tablet (100 mg total) by mouth daily.   No current facility-administered medications on file prior to visit.      Allergies  Allergen Reactions  . Hctz [Hydrochlorothiazide] Other (See Comments)    MUSCLE CRAMPING  . Metoclopramide Hcl Other (See Comments)    Cramping, anxiety and depression; "makes me crazy"  . Voltaren [Diclofenac Sodium] Other (See Comments)    Swelling in leg and became parched.   . Pristiq [Desvenlafaxine Succinate Er]   . Avelox [Moxifloxacin Hcl In Nacl]  Nausea And Vomiting  . Clindamycin/Lincomycin Rash  . Flagyl [Metronidazole] Rash  . Neosporin [Neomycin-Bacitracin Zn-Polymyx] Other (See Comments)    Blisters, rash    Review Of Systems:  Constitutional:   No  weight loss, night sweats,  Fevers, chills, fatigue, or  lassitude.  HEENT:   No headaches,  Difficulty swallowing,  Tooth/dental problems, or  +sore throat,                No sneezing, itching, ear ache,+ nasal congestion,+ post nasal drip,   CV:  No chest pain,  Orthopnea, PND, swelling in lower extremities, anasarca, dizziness, palpitations, syncope.   GI  No heartburn, indigestion, abdominal pain, nausea, vomiting, diarrhea, change in bowel  habits, loss of appetite, bloody stools.   Resp: No shortness of breath with exertion or at rest.  + excess mucus, + productive cough,  + non-productive cough,  No coughing up of blood.  + change in color of mucus.  No wheezing.  No chest wall deformity  Skin: no rash or lesions.  GU: no dysuria, change in color of urine, no urgency or frequency.  No flank pain, no hematuria   MS:  No joint pain or swelling.  No decreased range of motion.  No back pain.  Psych:  No change in mood or affect. No depression or anxiety.  No memory loss.   Vital Signs BP 140/80 (BP Location: Left Arm, Cuff Size: Normal)   Pulse 84   Temp 98.5 F (36.9 C) (Oral)   Ht 5\' 6"  (1.676 m)   Wt 194 lb 9.6 oz (88.3 kg)   SpO2 96%   BMI 31.41 kg/m    Physical Exam:  General- No distress,  A&Ox3, pleasant ENT: No sinus tenderness, TM clear, pale nasal mucosa, no oral exudate,+ post nasal drip, no LAN Cardiac: S1, S2, regular rate and rhythm, no murmur Chest: No wheeze/ rales/ dullness; no accessory muscle use, no nasal flaring, no sternal retractions Abd.: Soft Non-tender, obese Ext: No clubbing cyanosis, edema Neuro:  normal strength Skin: No rashes, warm and dry Psych: normal mood and behavior   Assessment/Plan  Irritable larynx Cough and PND She is not taking Zyrtec or using Breo Suspect laryngeal irritation/ Upper Airway Dysfunction Plan: DepoMedrol 80 mg today We will check a FENO today Re-start Breo high dose, 1 puff once daily x 1 month ( Please give sample) Rinse mouth after use. Prednisone taper; 10 mg tablets: 4 tabs x 2 days, 3 tabs x 2 days, 2 tabs x 2 days 1 tab x 2 days then stop. Sips of water, no throat clearing Sugar free lemon heads or Jolly Ranchers for throat soothing. Zyrtec 10 mg daily for Post nasal drip. Avoid chocolate, mint, and menthol. Mucinex 1200 mg with water once daily. GERD diet Continue Nexium Follow up in 1 month with Dr. Lamonte Sakai on NP. Please contact office  for sooner follow up if symptoms do not improve or worsen or seek emergency care      Magdalen Spatz, NP 02/22/2017  2:52 PM

## 2017-02-23 ENCOUNTER — Telehealth: Payer: Self-pay | Admitting: Acute Care

## 2017-02-23 NOTE — Telephone Encounter (Signed)
lmtcb X1 for pt  

## 2017-02-24 NOTE — Telephone Encounter (Signed)
Spoke with pt, states she was completely improved but cough resumed yesterday.  I reviewed AVS recs with pt-she is not taking several medications as prescribed at visit.  I advised pt that she should follow SG's instructions and call back if symptoms do not improve.  Pt expressed understanding.  Will close encounter.

## 2017-03-02 MED FILL — OMEPRAZOLE DR 40 MG CAPSULE: 40 | 30 days supply | Qty: 60 | Fill #0

## 2017-03-14 MED FILL — MUPIROCIN 2% OINTMENT: 2 | 10 days supply | Qty: 22 | Fill #1

## 2017-03-28 NOTE — Progress Notes (Signed)
Cardiology Office Note    Date:  03/29/2017   ID:  Nicole [name], DOB Apr 13, 1956, MRN [id number]  PCP:  Gaynelle Arabian, MD  Cardiologist: Sinclair Grooms, MD   Chief Complaint  Patient presents with  . Follow-up    BP    History of Present Illness:  Nicole [name] is a 61 y.o. female hypertension and also complaints of dizziness and giddiness.  No cardiovascular complaints. She no long works at [employer] or in the [employer]. She works in [place] with [employer]. . Overall doing well. No dyspnea or chest pain. She denies palpitations. She has not had syncope.  Past Medical History:  Diagnosis Date  . Anxiety   . Arthritis    lumbar- HNP, /w myelopathy  . Asthma    /w reflux , consult /w Dr. Wert-2013 / USES INHALER PRN INFREQUENTLY  . Bronchitis    used albuterol , Sept. 2013, all clear now   . Complication of anesthesia   . Difficulty sleeping    DUE TO PAIN  . Family history of anesthesia complication    N&V- Mother   . GERD (gastroesophageal reflux disease)    Chron's disease, not medically treating currently   . H/O hiatal hernia    small hiatal hernia, has had endoscopy & colondoscopy  . H/O vertigo   . Heart murmur    since birth  . Hypertension    followed by Dr. Marisue Humble   . Numbness in left leg   . PONV (postoperative nausea and vomiting)   . Stress incontinence     Past Surgical History:  Procedure Laterality Date  . BACK SURGERY  2013  . CHOLECYSTECTOMY  03   laparoscopic  . LUMBAR LAMINECTOMY/DECOMPRESSION MICRODISCECTOMY  09/10/2012   Procedure: LUMBAR LAMINECTOMY/DECOMPRESSION MICRODISCECTOMY 1 LEVEL;  Surgeon: Kristeen Miss, MD;  Location: [facility];  Service: Neurosurgery;  Laterality: Left;  Left Lumbar Five-Sacral One Microdiscectomy  . TOTAL HIP ARTHROPLASTY Left 12/24/2014   Procedure: LEFT TOTAL HIP ARTHROPLASTY ANTERIOR APPROACH;  Surgeon: Mauri Pole, MD;  Location: [facility];  Service: Orthopedics;  Laterality: Left;     Current Medications: Outpatient Medications Prior to Visit  Medication Sig Dispense Refill  . albuterol (PROVENTIL HFA;VENTOLIN HFA) 108 (90 BASE) MCG/ACT inhaler Inhale 2 puffs into the lungs every 6 (six) hours as needed for wheezing or shortness of breath. 1 Inhaler 2  . ALPRAZolam (XANAX) 0.5 MG tablet Take 0.5 mg by mouth at bedtime.    . fluticasone (FLONASE) 50 MCG/ACT nasal spray Place 2 sprays into both nostrils daily. 16 g 5  . fluticasone furoate-vilanterol (BREO ELLIPTA) 200-25 MCG/INH AEPB Inhale 1 puff into the lungs daily. 60 each 11  . diltiazem (CARDIZEM CD) 180 MG 24 hr capsule Take 1 capsule (180 mg total) by mouth daily. 90 capsule 3  . losartan (COZAAR) 100 MG tablet Take 1 tablet (100 mg total) by mouth daily. 90 tablet 3  . esomeprazole (NEXIUM) 40 MG capsule Take 40 mg by mouth 2 (two) times daily. Pt can not take generic    . predniSONE (DELTASONE) 10 MG tablet Take 4 tabs for 2 days, then 3 tabs for 2 days, 2 tabs for 2 days, then 1 tab for 2 days, then stop. (Patient not taking: Reported on 03/29/2017) 20 tablet 0   No facility-administered medications prior to visit.      Allergies:   Hctz [hydrochlorothiazide]; Metoclopramide hcl; Voltaren [diclofenac sodium]; Pristiq [desvenlafaxine succinate er]; Avelox [  moxifloxacin hcl in nacl]; Clindamycin/lincomycin; Flagyl [metronidazole]; and Neosporin [neomycin-bacitracin zn-polymyx]   Social History   Social History  . Marital status: Divorced    Spouse name: N/A  . Number of children: N/A  . Years of education: N/A   Occupational History  . RN [employer]   Social History Main Topics  . Smoking status: Former Smoker    Packs/day: 1.50    Years: 27.00    Types: Cigarettes    Quit date: 11/15/1999  . Smokeless tobacco: Never Used  . Alcohol use Yes     Comment: Seldom drinks, but occasional wine.  . Drug use: No  . Sexual activity: Not Asked   Other Topics Concern  . None   Social History Narrative   . None     Family History:  The patient's family history includes Asthma in her mother; Diabetes in her father; Glaucoma in her mother; Hypertension in her father and sister; Kidney disease in her father and mother; Stroke in her father; Thyroid disease in her mother.   ROS:   Please see the history of present illness.    Chronic back discomfort. Status post left hip replacement. Decreased short-term memory.  All other systems reviewed and are negative.   PHYSICAL EXAM:   VS:  BP 106/74 (BP Location: Left Arm)   Pulse 80   Ht 5\' 6"  (1.676 m)   Wt 191 lb 12.8 oz (87 kg)   BMI 30.96 kg/m    GEN: Well nourished, well developed, in no acute distress  HEENT: normal  Neck: no JVD, carotid bruits, or masses Cardiac: RRR; no murmurs, rubs, or gallops,no edema  Respiratory:  clear to auscultation bilaterally, normal work of breathing GI: soft, nontender, nondistended, + BS MS: no deformity or atrophy  Skin: warm and dry, no rash Neuro:  Alert and Oriented x 3, Strength and sensation are intact Psych: euthymic mood, full affect  Wt Readings from Last 3 Encounters:  03/29/17 191 lb 12.8 oz (87 kg)  02/22/17 194 lb 9.6 oz (88.3 kg)  01/18/17 196 lb 12.8 oz (89.3 kg)      Studies/Labs Reviewed:   EKG:  EKG  Normal sinus rhythm with normal appearance.  Recent Labs: No results found for requested labs within last 8760 hours.   Lipid Panel No results found for: CHOL, TRIG, HDL, CHOLHDL, VLDL, LDLCALC, LDLDIRECT  Additional studies/ records that were reviewed today include:  None    ASSESSMENT:    1. Essential hypertension      PLAN:  In order of problems listed above:  1. 2 g sodium diet, weight control, and continue medications as listed. Clinical follow-up with me in one year. Medications are refilled.    Medication Adjustments/Labs and Tests Ordered: Current medicines are reviewed at length with the patient today.  Concerns regarding medicines are outlined above.   Medication changes, Labs and Tests ordered today are listed in the Patient Instructions below. There are no Patient Instructions on file for this visit.   Signed, Sinclair Grooms, MD  03/29/2017 4:06 PM    [facility] [address], [place], [place]  [address] Phone: [phone]; Fax: [phone]

## 2017-03-29 ENCOUNTER — Encounter: Payer: Self-pay | Admitting: Acute Care

## 2017-03-29 ENCOUNTER — Encounter (INDEPENDENT_AMBULATORY_CARE_PROVIDER_SITE_OTHER): Payer: Self-pay

## 2017-03-29 ENCOUNTER — Encounter: Payer: Self-pay | Admitting: Interventional Cardiology

## 2017-03-29 ENCOUNTER — Ambulatory Visit (INDEPENDENT_AMBULATORY_CARE_PROVIDER_SITE_OTHER): Payer: BLUE CROSS/BLUE SHIELD | Admitting: Acute Care

## 2017-03-29 ENCOUNTER — Ambulatory Visit (INDEPENDENT_AMBULATORY_CARE_PROVIDER_SITE_OTHER): Payer: BLUE CROSS/BLUE SHIELD | Admitting: Interventional Cardiology

## 2017-03-29 VITALS — BP 106/74 | HR 80 | Ht 66.0 in | Wt 191.8 lb

## 2017-03-29 DIAGNOSIS — I10 Essential (primary) hypertension: Secondary | ICD-10-CM

## 2017-03-29 DIAGNOSIS — J387 Other diseases of larynx: Secondary | ICD-10-CM | POA: Diagnosis not present

## 2017-03-29 MED ORDER — ALBUTEROL SULFATE HFA 108 (90 BASE) MCG/ACT IN AERS: 2.0000 | INHALATION_SPRAY | Freq: Four times a day (QID) | RESPIRATORY_TRACT | 2 refills | Status: DC | PRN

## 2017-03-29 MED ORDER — DILTIAZEM HCL ER COATED BEADS 180 MG PO CP24: 180.0000 mg | ORAL_CAPSULE | Freq: Every day | ORAL | 3 refills | Status: DC

## 2017-03-29 MED ORDER — LOSARTAN POTASSIUM 100 MG PO TABS: 100.0000 mg | ORAL_TABLET | Freq: Every day | ORAL | 3 refills | Status: DC

## 2017-03-29 MED FILL — PROAIR HFA 90 MCG INHALER: 108 (90 BAS | 25 days supply | Qty: 9 | Fill #0

## 2017-03-29 NOTE — Patient Instructions (Addendum)
It is great to see you again. I am glad you are doing well. Continue using your rescue inhaler as needed up to once every 6 hours. Re-schedule 04/2017 appointment for 6 months from now with Dr. Lamonte Sakai. Please check to see if rescue inhaler has any refills if not, we will send one in. Please contact office for sooner follow up if symptoms do not improve or worsen or seek emergency care

## 2017-03-29 NOTE — Progress Notes (Signed)
History of Present Illness Nicole [name] is a 61 y.o. female former smoker with  ( 40 pack years, quit 2001) with asthma. She is followed by Dr. Lamonte Sakai.   03/29/2017 Follow up for cough: Pt. Presents for follow up. She has had a cough and nasal congestion with multiple antibiotic treatments and prednisone tapers since February 2018. She was last seen in the office on 02/22/2017 at that time she was treated with Depo-Medrol injection, 301 puff once daily , prednisone taper, and continued encouragement to use Zyrtec daily, utilize Mucinex, follow a GERD diet, continue Nexium, and utilize sugar free candies for throat soothing. She presents today stating  her cough is totally gone. She is no longer using her Breo. She is only using her rescue inhaler on the average of once daily. She denies chest pain, fever, orthopnea or hemoptysis.She states all nasal sinus problems or congestion have resolved She is doing well, and without symptoms or complaints.  Test Results:  CBC Latest Ref Rng & Units 12/26/2014 12/25/2014 12/18/2014  WBC 4.0 - 10.5 K/uL 17.5(H) 9.7 9.7  Hemoglobin 12.0 - 15.0 g/dL 10.8(L) 11.4(L) 13.2  Hematocrit 36.0 - 46.0 % 33.3(L) 35.0(L) 40.7  Platelets 150 - 400 K/uL 275 256 367    BMP Latest Ref Rng & Units 12/26/2014 12/25/2014 12/18/2014  Glucose 70 - 99 mg/dL 125(H) 160(H) 103(H)  BUN 6 - 23 mg/dL 14 11 13   Creatinine 0.50 - 1.10 mg/dL 0.66 0.74 0.75  Sodium 135 - 145 mmol/L 141 139 140  Potassium 3.5 - 5.1 mmol/L 3.8 4.1 4.0  Chloride 96 - 112 mmol/L 108 107 104  CO2 19 - 32 mmol/L 26 25 29   Calcium 8.4 - 10.5 mg/dL 8.8 8.8 9.4   PFT    Component Value Date/Time   FEV1PRE 1.96 06/09/2015 1504   FEV1POST 2.15 06/09/2015 1504   FVCPRE 2.87 06/09/2015 1504   FVCPOST 3.21 06/09/2015 1504   TLC 4.91 06/09/2015 1504   DLCOUNC 24.53 06/09/2015 1504   PREFEV1FVCRT 68 06/09/2015 1504   PSTFEV1FVCRT 67 06/09/2015 1504      Past medical hx Past Medical History:  Diagnosis  Date  . Anxiety   . Arthritis    lumbar- HNP, /w myelopathy  . Asthma    /w reflux , consult /w Dr. Wert-2013 / USES INHALER PRN INFREQUENTLY  . Bronchitis    used albuterol , Sept. 2013, all clear now   . Complication of anesthesia   . Difficulty sleeping    DUE TO PAIN  . Family history of anesthesia complication    N&V- Mother   . GERD (gastroesophageal reflux disease)    Chron's disease, not medically treating currently   . H/O hiatal hernia    small hiatal hernia, has had endoscopy & colondoscopy  . H/O vertigo   . Heart murmur    since birth  . Hypertension    followed by Dr. Marisue Humble   . Numbness in left leg   . PONV (postoperative nausea and vomiting)   . Stress incontinence      Social History  Substance Use Topics  . Smoking status: Former Smoker    Packs/day: 1.50    Years: 27.00    Types: Cigarettes    Quit date: 11/15/1999  . Smokeless tobacco: Never Used  . Alcohol use Yes     Comment: Seldom drinks, but occasional wine.      Past surgical hx, Family hx, Social hx all reviewed.  Current Outpatient Prescriptions on  File Prior to Visit  Medication Sig  . ALPRAZolam (XANAX) 0.5 MG tablet Take 0.5 mg by mouth at bedtime.  Marland Kitchen diltiazem (CARDIZEM CD) 180 MG 24 hr capsule Take 1 capsule (180 mg total) by mouth daily.  . fluticasone (FLONASE) 50 MCG/ACT nasal spray Place 2 sprays into both nostrils daily.  Marland Kitchen losartan (COZAAR) 100 MG tablet Take 1 tablet (100 mg total) by mouth daily.  . Omeprazole (PRILOSEC PO) Take 1 tablet by mouth 2 (two) times daily.  . fluticasone furoate-vilanterol (BREO ELLIPTA) 200-25 MCG/INH AEPB Inhale 1 puff into the lungs daily. (Patient not taking: Reported on 03/29/2017)   No current facility-administered medications on file prior to visit.      Allergies  Allergen Reactions  . Hctz [Hydrochlorothiazide] Other (See Comments)    MUSCLE CRAMPING  . Metoclopramide Hcl Other (See Comments)    Cramping, anxiety and depression;  "makes me crazy"  . Voltaren [Diclofenac Sodium] Other (See Comments)    Swelling in leg and became parched.   . Pristiq [Desvenlafaxine Succinate Er]   . Avelox [Moxifloxacin Hcl In Nacl] Nausea And Vomiting  . Clindamycin/Lincomycin Rash  . Flagyl [Metronidazole] Rash  . Neosporin [Neomycin-Bacitracin Zn-Polymyx] Other (See Comments)    Blisters, rash    Review Of Systems:  Constitutional:   No  weight loss, night sweats,  Fevers, chills, fatigue, or  lassitude.  HEENT:   No headaches,  Difficulty swallowing,  Tooth/dental problems, or  Sore throat,                No sneezing, itching, ear ache, nasal congestion, post nasal drip,   CV:  No chest pain,  Orthopnea, PND, swelling in lower extremities, anasarca, dizziness, palpitations, syncope.   GI  No heartburn, indigestion, abdominal pain, nausea, vomiting, diarrhea, change in bowel habits, loss of appetite, bloody stools.   Resp: No shortness of breath with exertion or at rest.  No excess mucus, no productive cough,  No non-productive cough,  No coughing up of blood.  No change in color of mucus.  No wheezing.  No chest wall deformity  Skin: no rash or lesions.  GU: no dysuria, change in color of urine, no urgency or frequency.  No flank pain, no hematuria   MS:  No joint pain or swelling.  No decreased range of motion.  No back pain.  Psych:  No change in mood or affect. No depression or anxiety.  No memory loss.   Vital Signs BP 128/74 (BP Location: Left Arm, Cuff Size: Normal)   Pulse 78   Ht 5\' 6"  (1.676 m)   Wt 191 lb 3.2 oz (86.7 kg)   SpO2 95%   BMI 30.86 kg/m    Physical Exam:  General- No distress,  A&Ox3, pleasant ENT: No sinus tenderness, TM clear, pale nasal mucosa, no oral exudate,no post nasal drip, no LAN, normocephalic,atraumatic Cardiac: S1, S2, regular rate and rhythm, no murmur Chest: No wheeze/ rales/ dullness; no accessory muscle use, no nasal flaring, no sternal retractions Abd.: Soft  Non-tender Ext: No clubbing cyanosis, edema, no obvious deformities Neuro:  normal strength, MAE x 4, A&O x3 Skin: No rashes, warm and dry Psych: normal mood and behavior   Assessment/Plan  Irritable larynx Resolved Plan Continue using your rescue inhaler as needed up to once every 6 hours. Re-schedule 04/2017 appointment for 6 months from now with Dr. Lamonte Sakai. Please check to see if rescue inhaler has any refills if not, we will send one in. Please  contact office for sooner follow up if symptoms do not improve or worsen or seek emergency care      Magdalen Spatz, NP 03/29/2017  7:23 PM

## 2017-03-29 NOTE — Patient Instructions (Signed)

## 2017-03-29 NOTE — Assessment & Plan Note (Signed)
Resolved Plan Continue using your rescue inhaler as needed up to once every 6 hours. Re-schedule 04/2017 appointment for 6 months from now with Dr. Lamonte Sakai. Please check to see if rescue inhaler has any refills if not, we will send one in. Please contact office for sooner follow up if symptoms do not improve or worsen or seek emergency care

## 2017-03-30 ENCOUNTER — Ambulatory Visit: Payer: Self-pay | Admitting: Acute Care

## 2017-03-31 MED FILL — ESOMEPRAZOLE MAG DR 40 MG C: 40 | 90 days supply | Qty: 180 | Fill #1

## 2017-04-19 IMAGING — CR DG CHEST 2V
2 series · 2 of 2 positions shown · non-contrast
Comparison: September 23, 2011.

CLINICAL DATA: Cough, chest pain.

EXAM:
CHEST  2 VIEW

[w chest pa]
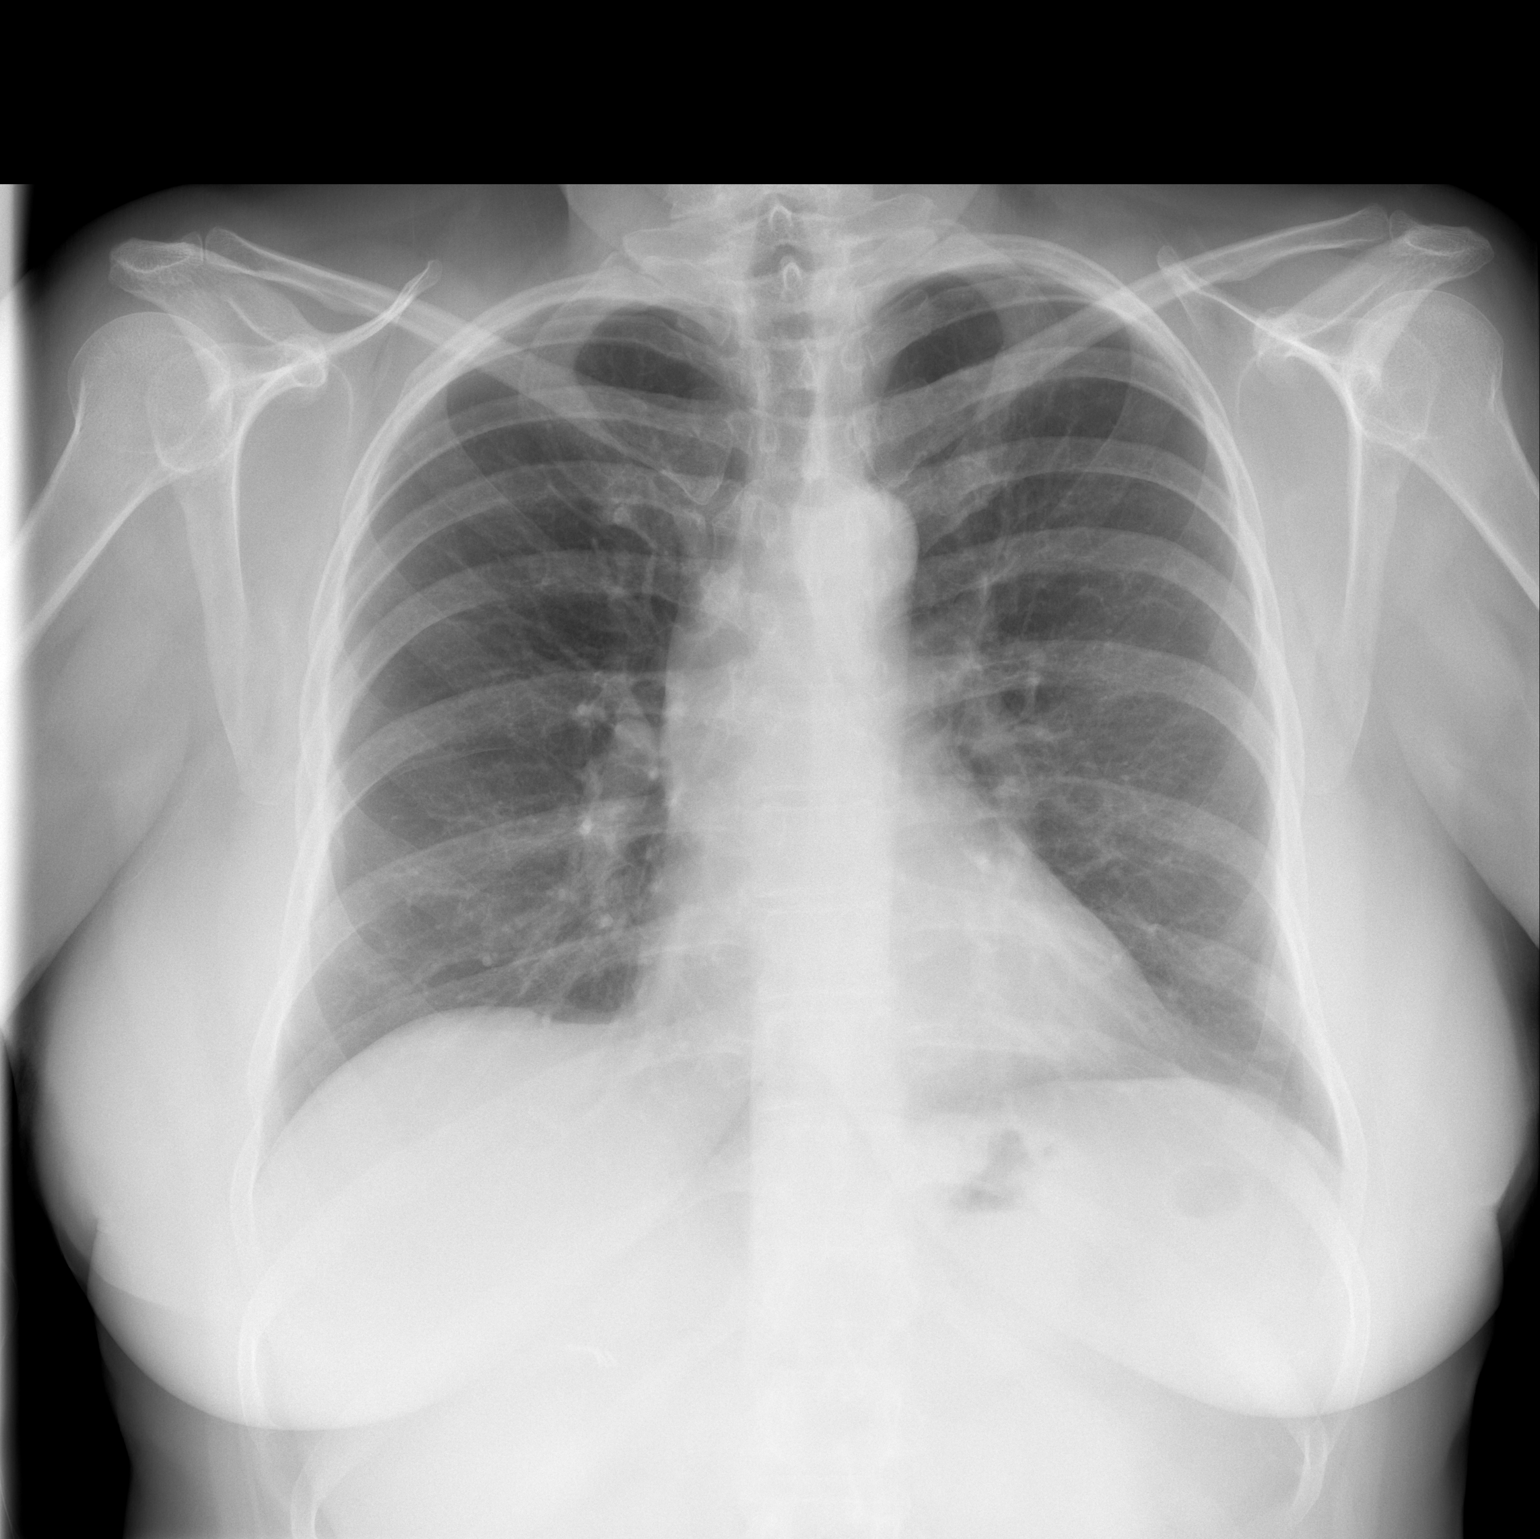

[w chest lat]
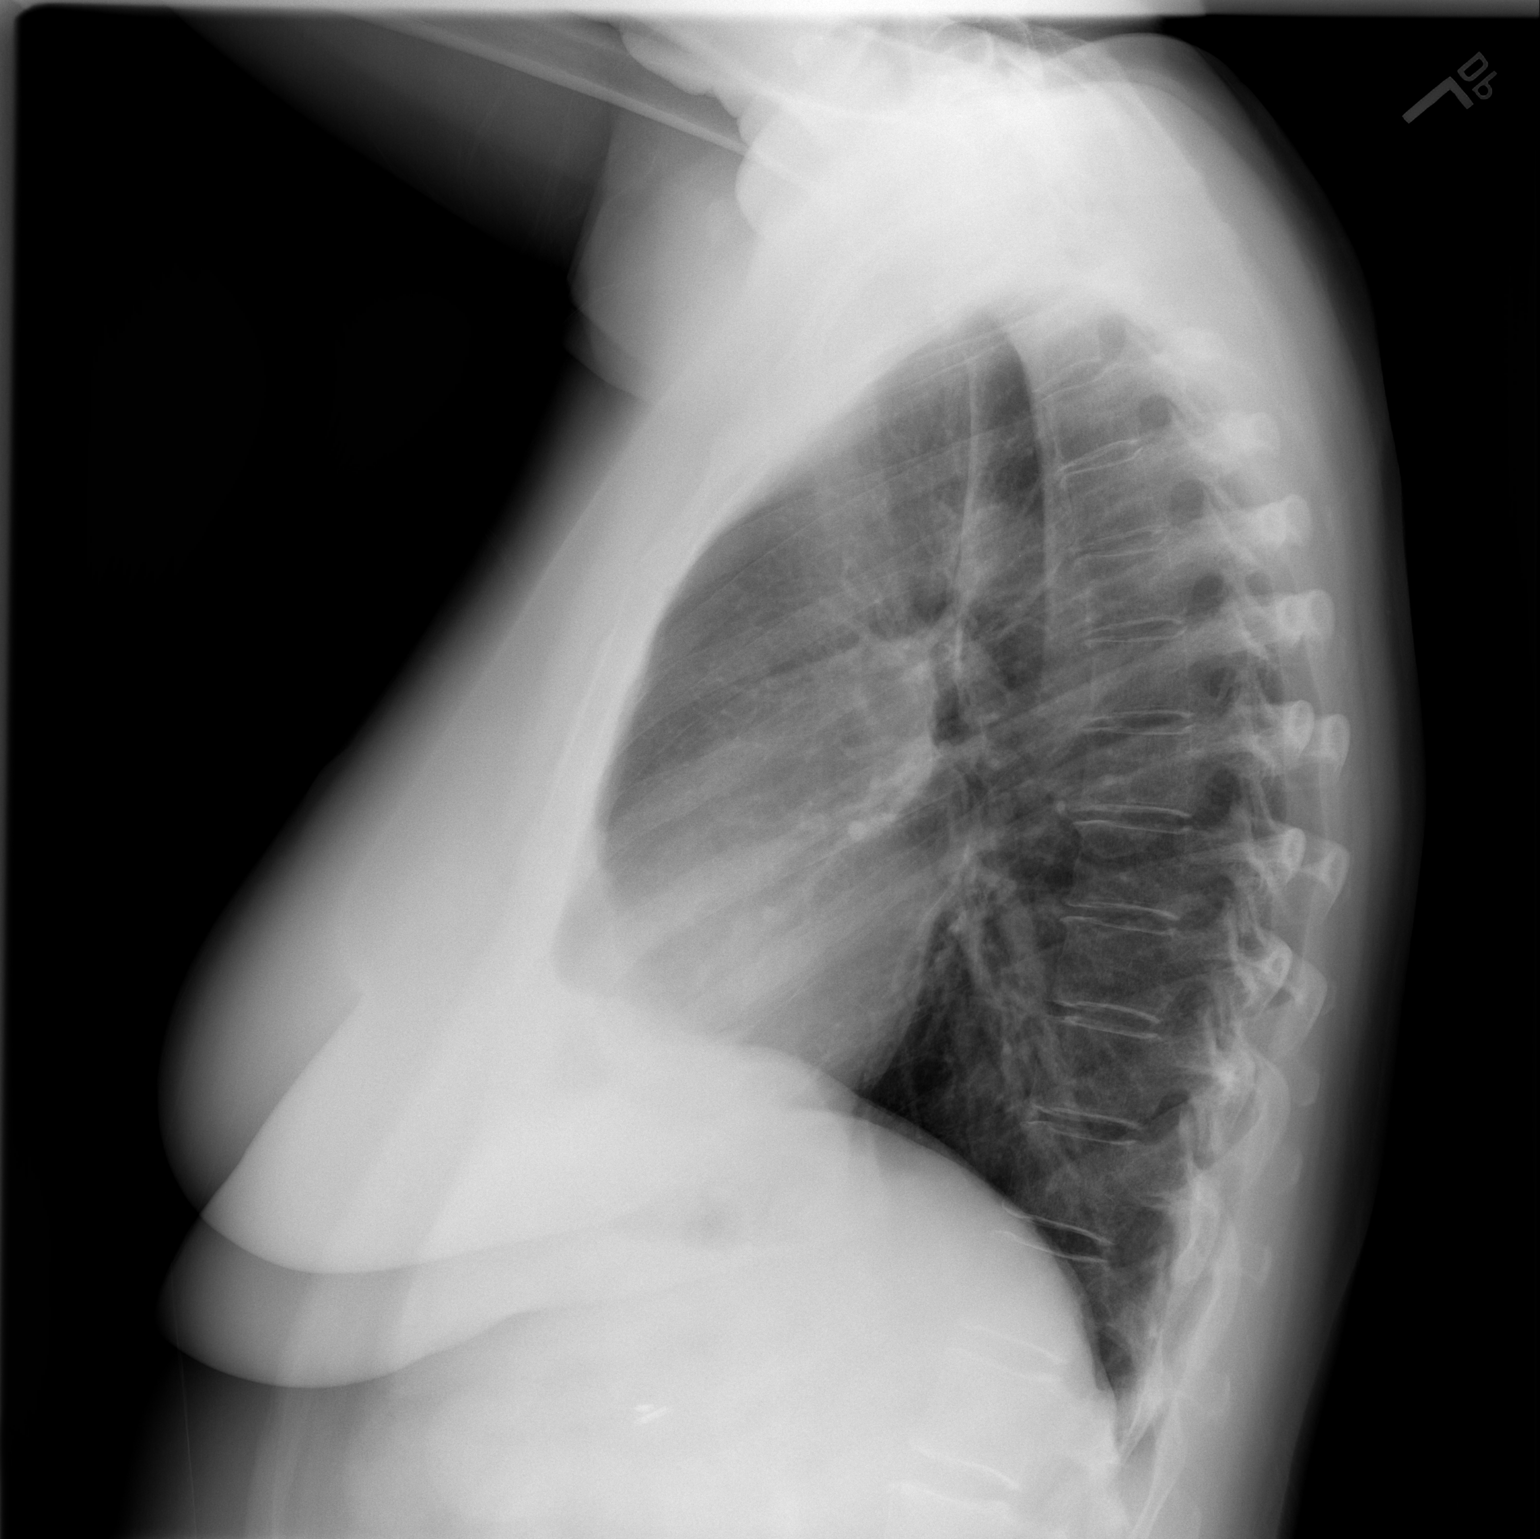

[2 of 2 positions shown; findings below may reference images not displayed]

FINDINGS: The heart size and mediastinal contours are within normal limits.
Both lungs are clear. No pneumothorax or pleural effusion is noted.
The visualized skeletal structures are unremarkable.
IMPRESSION: No active cardiopulmonary disease.

## 2017-05-01 MED FILL — AMOXICILLIN 500 MG CAPSULE: 500 | 3 days supply | Qty: 12 | Fill #0

## 2017-05-03 MED FILL — LOSARTAN POTASSIUM 100 MG T: 100 | 90 days supply | Qty: 90 | Fill #0

## 2017-05-03 MED FILL — CARTIA XT 180 MG CAPSULE SA: 180 | 90 days supply | Qty: 90 | Fill #0

## 2017-05-03 MED FILL — PREMARIN VAGINAL CREAM-APPL: 0.625 | 90 days supply | Qty: 30 | Fill #0

## 2017-05-10 ENCOUNTER — Ambulatory Visit: Payer: Self-pay | Admitting: Emergency Medicine

## 2017-05-10 MED FILL — traMADol HCL 50 MG TABS: 50 | 7 days supply | Qty: 28 | Fill #0

## 2017-05-25 ENCOUNTER — Encounter: Payer: Self-pay | Admitting: Neurology

## 2017-06-01 DIAGNOSIS — Z01419 Encounter for gynecological examination (general) (routine) without abnormal findings: Secondary | ICD-10-CM | POA: Diagnosis not present

## 2017-06-08 MED FILL — CEPHALEXIN 500 MG CAPSULE: 500 | 7 days supply | Qty: 21 | Fill #0

## 2017-06-09 ENCOUNTER — Encounter: Payer: Self-pay | Admitting: Neurology

## 2017-06-09 ENCOUNTER — Ambulatory Visit (INDEPENDENT_AMBULATORY_CARE_PROVIDER_SITE_OTHER): Payer: BLUE CROSS/BLUE SHIELD | Admitting: Neurology

## 2017-06-09 VITALS — BP 120/78 | HR 82 | Ht 66.0 in | Wt 198.0 lb

## 2017-06-09 DIAGNOSIS — R42 Dizziness and giddiness: Secondary | ICD-10-CM

## 2017-06-09 DIAGNOSIS — R519 Headache, unspecified: Secondary | ICD-10-CM

## 2017-06-09 DIAGNOSIS — R51 Headache: Secondary | ICD-10-CM | POA: Diagnosis not present

## 2017-06-09 NOTE — Patient Instructions (Signed)
I think the headaches are likely tension headaches and the dizziness is possibly related to anxiety.  However, we should get an MRI of the brain with and without contrast to evaluate for any other potential causes.  I will contact you with results.

## 2017-06-09 NOTE — Progress Notes (Signed)
NEUROLOGY CONSULTATION NOTE  Nicole [name] MRN: [id number] DOB: 09-10-1956  Referring provider: Dr. Marisue Humble Primary care provider: Dr. Marisue Humble  Reason for consult:  headache  HISTORY OF PRESENT ILLNESS: Nicole [name] is a 61 year old female with hypertension, COPD, dyslipidemia, depression and IBS who presents for headache and dizziness.  History supplemented by PCP and ED notes.  Earlier this month, she was at the beach.  On 05/16/17, she was outside in the sun when she suddenly felt a strange sensation move across her head.  She felt nauseous, was diaphoretic and was pale.  She just didn't feel well.  She was found by EMS to have normal blood sugar and normal EKG.  Once she cooled down, she started to feel better.  She denies vomiting, slurred speech, visual disturbance, spinning sensation, gait instability and focal numbness or weakness.  On 05/19/17, she went to the ED at [facility] for further evaluation.  EKG was again unremarkable.  CBC and UA were unremarkable with no evidence of infection.  BMP, Mg and CK were normal.  She was diagnosed with heat exhaustion.  She has history of easily feeling hot and sweating.  At the beach, the temperature in the house was kept at 78.  She was drinking soda and not too much water.  She also has chronic diarrhea.  She was busy working outside in the sun as well.  However, she didn't really feel too hot at the time.  She was treated in the ED with IV fluids and didn't feel too much better.    Since then, she has had episodes of not feeling well.  It is a strange sensation in the head with lightheadedness and feeling unsteady for just a few seconds.  It is not a spinning sensation.  It may occur when looking down or while driving.  Nothing else seems to trigger it or make it better.  In addition, she has had new headaches over the past couple of weeks.  They are moderate, in band like distribution or just occipital and  non-throbbing pressure.  There are no associated symptoms.  They last anywhere from a couple of hours to several ours and occur 3 to 4 days a week.   She denies history of headaches.  She has longstanding history of vestibular sensitivity, such as easily feeling carsick.  A few years ago, she had a fairly severe case of positional vertigo, but this is different.  She is under a lot of stress in regards to caring for her mother who has been irritable and having behavioral issues.  PAST MEDICAL HISTORY: Past Medical History:  Diagnosis Date  . Anxiety   . Arthritis    lumbar- HNP, /w myelopathy  . Asthma    /w reflux , consult /w Dr. Wert-2013 / USES INHALER PRN INFREQUENTLY  . Bronchitis    used albuterol , Sept. 2013, all clear now   . Complication of anesthesia   . Difficulty sleeping    DUE TO PAIN  . Family history of anesthesia complication    N&V- Mother   . GERD (gastroesophageal reflux disease)    Chron's disease, not medically treating currently   . H/O hiatal hernia    small hiatal hernia, has had endoscopy & colondoscopy  . H/O vertigo   . Heart murmur    since birth  . Hypertension    followed by Dr. Marisue Humble   . Numbness in left leg   .  PONV (postoperative nausea and vomiting)   . Stress incontinence     PAST SURGICAL HISTORY: Past Surgical History:  Procedure Laterality Date  . BACK SURGERY  2013  . CHOLECYSTECTOMY  03   laparoscopic  . LUMBAR LAMINECTOMY/DECOMPRESSION MICRODISCECTOMY  09/10/2012   Procedure: LUMBAR LAMINECTOMY/DECOMPRESSION MICRODISCECTOMY 1 LEVEL;  Surgeon: Kristeen Miss, MD;  Location: [facility];  Service: Neurosurgery;  Laterality: Left;  Left Lumbar Five-Sacral One Microdiscectomy  . TOTAL HIP ARTHROPLASTY Left 12/24/2014   Procedure: LEFT TOTAL HIP ARTHROPLASTY ANTERIOR APPROACH;  Surgeon: Mauri Pole, MD;  Location: [facility];  Service: Orthopedics;  Laterality: Left;    MEDICATIONS: Current Outpatient Prescriptions on File Prior to  Visit  Medication Sig Dispense Refill  . albuterol (PROVENTIL HFA;VENTOLIN HFA) 108 (90 Base) MCG/ACT inhaler Inhale 2 puffs into the lungs every 6 (six) hours as needed for wheezing or shortness of breath. 1 Inhaler 2  . ALPRAZolam (XANAX) 0.5 MG tablet Take 0.5 mg by mouth at bedtime.    Marland Kitchen diltiazem (CARDIZEM CD) 180 MG 24 hr capsule Take 1 capsule (180 mg total) by mouth daily. 90 capsule 3  . fluticasone (FLONASE) 50 MCG/ACT nasal spray Place 2 sprays into both nostrils daily. 16 g 5  . fluticasone furoate-vilanterol (BREO ELLIPTA) 200-25 MCG/INH AEPB Inhale 1 puff into the lungs daily. (Patient not taking: Reported on 03/29/2017) 60 each 11  . losartan (COZAAR) 100 MG tablet Take 1 tablet (100 mg total) by mouth daily. 90 tablet 3  . Omeprazole (PRILOSEC PO) Take 1 tablet by mouth 2 (two) times daily.     No current facility-administered medications on file prior to visit.     ALLERGIES: Allergies  Allergen Reactions  . Hctz [Hydrochlorothiazide] Other (See Comments)    MUSCLE CRAMPING  . Metoclopramide Hcl Other (See Comments)    Cramping, anxiety and depression; "makes me crazy"  . Other Other (See Comments)    Cramping  . Tramadol Other (See Comments)    "wig out"  . Voltaren [Diclofenac Sodium] Other (See Comments)    Swelling in leg and became parched.   . Pristiq [Desvenlafaxine Succinate Er]   . Avelox [Moxifloxacin Hcl In Nacl] Nausea And Vomiting  . Clindamycin Other (See Comments)  . Clindamycin/Lincomycin Rash  . Flagyl [Metronidazole] Rash  . Moxifloxacin Other (See Comments)  . Neosporin [Neomycin-Bacitracin Zn-Polymyx] Other (See Comments)    Blisters, rash    FAMILY HISTORY: Family History  Problem Relation Age of Onset  . Kidney disease Mother   . Asthma Mother   . Thyroid disease Mother   . Glaucoma Mother   . Diabetes Father   . Stroke Father   . Hypertension Father   . Kidney disease Father   . Hypertension Sister     SOCIAL HISTORY: Social  History   Social History  . Marital status: Divorced    Spouse name: N/A  . Number of children: 0  . Years of education: 14   Occupational History  . RN    Social History Main Topics  . Smoking status: Former Smoker    Packs/day: 1.50    Years: 27.00    Types: Cigarettes    Quit date: 11/15/1999  . Smokeless tobacco: Never Used  . Alcohol use Yes     Comment: Seldom drinks, but occasional wine.  . Drug use: No  . Sexual activity: Not on file   Other Topics Concern  . Not on file   Social History Narrative   Lives  with mom and dad in a one story home.  Has no children.  Works as a travel Marine scientist.  Education: college.    REVIEW OF SYSTEMS: Constitutional: No fevers, chills, or sweats, no generalized fatigue, change in appetite Eyes: No visual changes, double vision, eye pain Ear, nose and throat: No hearing loss, ear pain, nasal congestion, sore throat Cardiovascular: No chest pain, palpitations Respiratory:  No shortness of breath at rest or with exertion, wheezes GastrointestinaI: diarrhea Genitourinary:  No dysuria, urinary retention or frequency Musculoskeletal:  No neck pain, back pain Integumentary: No rash, pruritus, skin lesions Neurological: as above Psychiatric: anxiety Endocrine: No palpitations, fatigue, diaphoresis, mood swings, change in appetite, change in weight, increased thirst Hematologic/Lymphatic:  No purpura, petechiae. Allergic/Immunologic: no itchy/runny eyes, nasal congestion, recent allergic reactions, rashes  PHYSICAL EXAM: Vitals:   06/09/17 1301  BP: 120/78  Pulse: 82   General: No acute distress.  Patient appears well-groomed.  Head:  Normocephalic/atraumatic Eyes:  fundi examined but not visualized Neck: supple, no paraspinal tenderness, full range of motion Back: No paraspinal tenderness Heart: regular rate and rhythm Lungs: Clear to auscultation bilaterally. Vascular: No carotid bruits. Neurological Exam: Mental status: alert and  oriented to person, place, and time, recent and remote memory intact, fund of knowledge intact, attention and concentration intact, speech fluent and not dysarthric, language intact. Cranial nerves: CN I: not tested CN II: pupils equal, round and reactive to light, visual fields intact CN III, IV, VI:  full range of motion, no nystagmus, no ptosis CN V: facial sensation intact CN VII: upper and lower face symmetric CN VIII: hearing intact CN IX, X: gag intact, uvula midline CN XI: sternocleidomastoid and trapezius muscles intact CN XII: tongue midline Bulk & Tone: normal, no fasciculations. Motor:  5/5 throughout  Sensation:  Decreased temperature along the lateral aspect of the left foot;  Otherwise temperature and vibration sensation intact. Deep Tendon Reflexes:  2+ throughout except absent in left ankle,  toes downgoing.  Finger to nose testing:  Without dysmetria.  Heel to shin:  Without dysmetria.  Gait:  Normal station and stride.  Able to turn and tandem walk. Romberg negative.  IMPRESSION: 1.  Initial event at the beach may have been related to heat exhaustion.  She has several factors that could have contributed to dehydration.  As for current persistent symptoms: 2.  Headache, probably tension type headache 3.  Dizziness may be related to anxiety in a person already with vestibular sensitivity.  PLAN: 1.  Given that these are new frequent headaches in a woman over 50, we will get MRI of brain with and without contrast. 2.  Advised to optimize lifestyle modification:  Addressing stress management, increase exercise, continue hydration. 3.  Further recommendations pending MRI results.  Thank you for allowing me to take part in the care of this patient.  Metta Clines, DO  CC:  Gaynelle Arabian, MD

## 2017-06-15 ENCOUNTER — Telehealth: Payer: Self-pay | Admitting: Neurology

## 2017-06-15 NOTE — Telephone Encounter (Signed)
Informed patient that I will cancel the MRI for her.

## 2017-06-15 NOTE — Telephone Encounter (Signed)
Please advise 

## 2017-06-15 NOTE — Telephone Encounter (Signed)
PT called and said she was not having anymore dizzy spells and wants to know if she should cancel her MRI

## 2017-06-15 NOTE — Telephone Encounter (Signed)
If she is no longer having headaches or dizzy spells, okay to hold off on MRI.

## 2017-06-23 ENCOUNTER — Other Ambulatory Visit: Payer: Self-pay

## 2017-07-03 MED FILL — ESOMEPRAZOLE MAG DR 40 MG C: 40 | 90 days supply | Qty: 180 | Fill #2

## 2017-07-26 MED FILL — LOSARTAN POTASSIUM 100 MG T: 100 | 90 days supply | Qty: 90 | Fill #1

## 2017-07-26 MED FILL — valACYclovir HCL 1 GM TABS: 1 | 90 days supply | Qty: 90 | Fill #2

## 2017-07-26 MED FILL — ALPRAZolam 0.5 MG TABS: 0.5 | 90 days supply | Qty: 360 | Fill #0

## 2017-07-26 MED FILL — CARTIA XT 180 MG CAPSULE SA: 180 | 90 days supply | Qty: 90 | Fill #1

## 2017-09-14 MED FILL — SUCRALFATE 1 GM TABLET: 1 | 30 days supply | Qty: 60 | Fill #0

## 2017-09-14 MED FILL — PREMARIN VAGINAL CREAM-APPL: 0.625 | 90 days supply | Qty: 30 | Fill #1

## 2017-09-28 MED FILL — HYDROCODON-APAP 5-325: 5-325 | 15 days supply | Qty: 60 | Fill #0

## 2017-09-28 MED FILL — ESOMEPRAZOLE MAG DR 40 MG C: 40 | 90 days supply | Qty: 180 | Fill #3

## 2017-10-04 MED FILL — TERBINAFINE HCL 250 MG TAB: 250 | 30 days supply | Qty: 30 | Fill #0

## 2017-10-09 MED FILL — predniSONE 5 MG (21) TBPK: 5 | 12 days supply | Qty: 21 | Fill #0

## 2017-10-25 MED FILL — VANCOMYCIN HCL 250 MG CAP: 250 | 10 days supply | Qty: 30 | Fill #0

## 2017-10-27 MED FILL — LOSARTAN POTASSIUM 100 MG T: 100 | 90 days supply | Qty: 90 | Fill #2 | Status: TO

## 2017-10-27 MED FILL — CARTIA XT 180 MG CAPSULE SA: 180 | 90 days supply | Qty: 90 | Fill #2 | Status: TO

## 2017-11-20 MED FILL — SUCRALFATE 1 GM TABLET: 1 | 30 days supply | Qty: 60 | Fill #0

## 2017-12-07 MED FILL — predniSONE 5 MG TABS: 5 | 21 days supply | Qty: 30 | Fill #0

## 2017-12-12 MED FILL — PREMARIN VAGINAL CREAM-APPL: 0.625 | 60 days supply | Qty: 30 | Fill #0

## 2017-12-20 MED FILL — DARIFENACIN ER 15 MG TABLET: 15 | 30 days supply | Qty: 30 | Fill #0

## 2018-01-04 MED FILL — ESOMEPRAZOLE MAG DR 40 MG C: 40 | 7 days supply | Qty: 14 | Fill #0

## 2018-01-15 ENCOUNTER — Telehealth: Payer: Self-pay | Admitting: Interventional Cardiology

## 2018-01-15 MED FILL — ESOMEPRAZOLE MAG DR 40 MG C: 40 | 90 days supply | Qty: 180 | Fill #1

## 2018-01-15 MED FILL — DILTIAZEM 24HR ER 180 MG CA: 180 | 60 days supply | Qty: 60 | Fill #0

## 2018-01-15 MED FILL — LOSARTAN POTASSIUM 100 MG T: 100 | 60 days supply | Qty: 60 | Fill #0

## 2018-01-15 NOTE — Telephone Encounter (Signed)
Spoke with pt and advised her to reach out to her pharmacy to see if her particular prescription lot was part of the recall.  Advised if it was we can certainly switch the medication for her.  Pt verbalized understanding and was appreciative for call.

## 2018-01-15 NOTE — Telephone Encounter (Signed)
New message     Pt c/o medication issue:  1. Name of Medication: losartan (COZAAR) 100 MG tablet  2. How are you currently taking this medication (dosage and times per day)?  1x a day   3. Are you having a reaction (difficulty breathing--STAT)? no  4. What is your medication issue? Does she have to switch medication because of recall? This one seems to be controlling her bp really well and she is not concerned unless your are

## 2018-02-21 MED FILL — ESCITALOPRAM 10 MG TABLET: 10 | 30 days supply | Qty: 30 | Fill #0

## 2018-03-01 ENCOUNTER — Other Ambulatory Visit: Payer: Self-pay | Admitting: Interventional Cardiology

## 2018-03-01 MED ORDER — LOSARTAN POTASSIUM 100 MG PO TABS: 100.0000 mg | ORAL_TABLET | Freq: Every day | ORAL | 0 refills | Status: DC

## 2018-03-01 MED FILL — valACYclovir HCL 1 GM TABS: 1 | 90 days supply | Qty: 90 | Fill #0

## 2018-03-01 NOTE — Telephone Encounter (Signed)
Pt's medication was sent to pt's pharmacy as requested. Confirmation received.  °

## 2018-03-01 NOTE — Telephone Encounter (Signed)
New Message    *STAT* If patient is at the pharmacy, call can be transferred to refill team.   1. Which medications need to be refilled? (please list name of each medication and dose if known) losartan (COZAAR) 100 MG tablet and diltiazem (CARDIZEM CD) 180 MG 24 hr capsule  2. Which pharmacy/location (including street and city if local pharmacy) is medication to be sent to? [place], [place]  3. Do they need a 30 day or 90 day supply? [place]

## 2018-04-03 ENCOUNTER — Other Ambulatory Visit: Payer: Self-pay | Admitting: Internal Medicine

## 2018-04-10 ENCOUNTER — Other Ambulatory Visit: Payer: Self-pay | Admitting: Interventional Cardiology

## 2018-04-10 DIAGNOSIS — I10 Essential (primary) hypertension: Secondary | ICD-10-CM

## 2018-04-10 MED FILL — ESCITALOPRAM 10 MG TABLET: 10 | 30 days supply | Qty: 30 | Fill #1

## 2018-04-10 MED FILL — ESOMEPRAZOLE MAG DR 40 MG C: 40 | 30 days supply | Qty: 30 | Fill #2

## 2018-04-10 MED FILL — LOSARTAN POTASSIUM 100 MG T: 100 | 90 days supply | Qty: 90 | Fill #0

## 2018-04-29 NOTE — Progress Notes (Signed)
Cardiology Office Note    Date:  04/30/2018   ID:  Nicole [name], DOB 09-25-1956, MRN [id number]  PCP:  Gaynelle Arabian, MD  Cardiologist: Sinclair Grooms, MD   Chief Complaint  Patient presents with  . Hypertension    History of Present Illness:  Nicole [name] is a 62 y.o. female  hypertension and also complaints of dizziness and giddiness.   She is doing well.  Blood pressure has been under control.  Her father died recently due to aspiration.  She is upset with the hospitalist service and the skilled nursing facility that he was transferred to because of benzodiazepine therapy that she feels caused him to stop breathing.  She is followed complaints.  Denies chest pain, orthopnea, PND.  Dizziness and giddiness have resolved.   Past Medical History:  Diagnosis Date  . Anxiety   . Arthritis    lumbar- HNP, /w myelopathy  . Asthma    /w reflux , consult /w Dr. Wert-2013 / USES INHALER PRN INFREQUENTLY  . Bronchitis    used albuterol , Sept. 2013, all clear now   . Complication of anesthesia   . Difficulty sleeping    DUE TO PAIN  . Family history of anesthesia complication    N&V- Mother   . GERD (gastroesophageal reflux disease)    Chron's disease, not medically treating currently   . H/O hiatal hernia    small hiatal hernia, has had endoscopy & colondoscopy  . H/O vertigo   . Heart murmur    since birth  . Hypertension    followed by Dr. Marisue Humble   . Numbness in left leg   . PONV (postoperative nausea and vomiting)   . Stress incontinence     Past Surgical History:  Procedure Laterality Date  . BACK SURGERY  2013  . CHOLECYSTECTOMY  03   laparoscopic  . LUMBAR LAMINECTOMY/DECOMPRESSION MICRODISCECTOMY  09/10/2012   Procedure: LUMBAR LAMINECTOMY/DECOMPRESSION MICRODISCECTOMY 1 LEVEL;  Surgeon: Kristeen Miss, MD;  Location: [facility];  Service: Neurosurgery;  Laterality: Left;  Left Lumbar Five-Sacral One Microdiscectomy  . TOTAL HIP ARTHROPLASTY  Left 12/24/2014   Procedure: LEFT TOTAL HIP ARTHROPLASTY ANTERIOR APPROACH;  Surgeon: Mauri Pole, MD;  Location: [facility];  Service: Orthopedics;  Laterality: Left;    Current Medications: Outpatient Medications Prior to Visit  Medication Sig Dispense Refill  . albuterol (PROVENTIL HFA;VENTOLIN HFA) 108 (90 Base) MCG/ACT inhaler Inhale 2 puffs into the lungs every 6 (six) hours as needed for wheezing or shortness of breath. 1 Inhaler 2  . ALPRAZolam (XANAX) 0.5 MG tablet Take 0.5 mg by mouth at bedtime.    Marland Kitchen diltiazem (CARDIZEM CD) 180 MG 24 hr capsule Take 1 capsule (180 mg total) by mouth daily. Please keep upcoming appt in August with Dr. Tamala Julian for future refills. Thank you 60 capsule 2  . escitalopram (LEXAPRO) 10 MG tablet Take 5 mg by mouth daily.   12  . esomeprazole (NEXIUM) 40 MG capsule Take 40 mg by mouth daily.  1  . fluticasone (FLONASE) 50 MCG/ACT nasal spray Place 2 sprays into both nostrils daily as needed (For cold symptoms).    . fluticasone furoate-vilanterol (BREO ELLIPTA) 200-25 MCG/INH AEPB Inhale 1 puff into the lungs daily as needed (For cold symptoms).    . losartan (COZAAR) 100 MG tablet Take 1 tablet (100 mg total) by mouth daily. Please keep upcoming appt in August with Dr. Tamala Julian for future refills. Thank you 90 tablet  0  . Sucralfate (CARAFATE PO) Take 1 tablet by mouth daily.    . valACYclovir (VALTREX) 1000 MG tablet Take 1,000 mg by mouth daily.  0  . fluticasone (FLONASE) 50 MCG/ACT nasal spray Place 2 sprays into both nostrils daily. (Patient not taking: Reported on 04/30/2018) 16 g 5  . fluticasone furoate-vilanterol (BREO ELLIPTA) 200-25 MCG/INH AEPB Inhale 1 puff into the lungs daily. (Patient not taking: Reported on 04/30/2018) 60 each 11  . Omeprazole (PRILOSEC PO) Take 1 tablet by mouth 2 (two) times daily.     No facility-administered medications prior to visit.      Allergies:   Hctz [hydrochlorothiazide]; Hydralazine hcl; Metoclopramide hcl; Other;  Tramadol; Voltaren [diclofenac sodium]; Pristiq [desvenlafaxine succinate er]; Avelox [moxifloxacin hcl in nacl]; Clindamycin; Clindamycin/lincomycin; Flagyl [metronidazole]; Moxifloxacin; and Neosporin [neomycin-bacitracin zn-polymyx]   Social History   Socioeconomic History  . Marital status: Divorced    Spouse name: Not on file  . Number of children: 0  . Years of education: 84  . Highest education level: Not on file  Occupational History  . Occupation: Therapist, sports  Social Needs  . Financial resource strain: Not on file  . Food insecurity:    Worry: Not on file    Inability: Not on file  . Transportation needs:    Medical: Not on file    Non-medical: Not on file  Tobacco Use  . Smoking status: Former Smoker    Packs/day: 1.50    Years: 27.00    Pack years: 40.50    Types: Cigarettes    Last attempt to quit: 11/15/1999    Years since quitting: 18.4  . Smokeless tobacco: Never Used  Substance and Sexual Activity  . Alcohol use: Yes    Comment: Seldom drinks, but occasional wine.  . Drug use: No  . Sexual activity: Not on file  Lifestyle  . Physical activity:    Days per week: Not on file    Minutes per session: Not on file  . Stress: Not on file  Relationships  . Social connections:    Talks on phone: Not on file    Gets together: Not on file    Attends religious service: Not on file    Active member of club or organization: Not on file    Attends meetings of clubs or organizations: Not on file    Relationship status: Not on file  Other Topics Concern  . Not on file  Social History Narrative   Lives with mom and dad in a one story home.  Has no children.  Works as a travel Marine scientist.  Education: college.     Family History:  The patient's family history includes Asthma in her mother; Diabetes in her father; Glaucoma in her mother; Hypertension in her father and sister; Kidney disease in her father and mother; Stroke in her father; Thyroid disease in her mother.   ROS:     Please see the history of present illness.    Cough, depression, headache. All other systems reviewed and are negative.   PHYSICAL EXAM:   VS:  BP 112/70   Pulse 73   Ht 5\' 6"  (1.676 m)   Wt 192 lb (87.1 kg)   SpO2 95%   BMI 30.99 kg/m    GEN: Well nourished, well developed, in no acute distress  HEENT: normal  Neck: no JVD, carotid bruits, or masses Cardiac: RRR; no murmurs, rubs, or gallops,no edema  Respiratory:  clear to auscultation bilaterally, normal work of  breathing GI: soft, nontender, nondistended, + BS MS: no deformity or atrophy  Skin: warm and dry, no rash Neuro:  Alert and Oriented x 3, Strength and sensation are intact Psych: euthymic mood, full affect  Wt Readings from Last 3 Encounters:  04/30/18 192 lb (87.1 kg)  06/09/17 198 lb (89.8 kg)  03/29/17 191 lb 3.2 oz (86.7 kg)      Studies/Labs Reviewed:   EKG:  EKG normal sinus rhythm.  Normal tracing.  When compared to prior, no changes noted.  Recent Labs: No results found for requested labs within last 8760 hours.   Lipid Panel No results found for: CHOL, TRIG, HDL, CHOLHDL, VLDL, LDLCALC, LDLDIRECT  Additional studies/ records that were reviewed today include:  No new data    ASSESSMENT:    1. Essential hypertension      PLAN:  In order of problems listed above:  1. Excellent blood pressure control.  Continue current therapy.  Aerobic exercise is encouraged.  Low-salt diet.  Clinical follow-up in 1 year.      Medication Adjustments/Labs and Tests Ordered: Current medicines are reviewed at length with the patient today.  Concerns regarding medicines are outlined above.  Medication changes, Labs and Tests ordered today are listed in the Patient Instructions below. Patient Instructions  Medication Instructions:  Your physician recommends that you continue on your current medications as directed. Please refer to the Current Medication list given to you  today.  Labwork: None  Testing/Procedures: None  Follow-Up: Your physician wants you to follow-up in: 1 year with Dr. Tamala Julian or as needed.  You will receive a reminder letter in the mail two months in advance. If you don't receive a letter, please call our office to schedule the follow-up appointment.   Any Other Special Instructions Will Be Listed Below (If Applicable).     If you need a refill on your cardiac medications before your next appointment, please call your pharmacy.      Signed, Sinclair Grooms, MD  04/30/2018 5:28 PM    [facility] [place] [address], [place], [place]  [address] Phone: [phone]; Fax: [phone]

## 2018-04-30 ENCOUNTER — Encounter (INDEPENDENT_AMBULATORY_CARE_PROVIDER_SITE_OTHER): Payer: Self-pay

## 2018-04-30 ENCOUNTER — Ambulatory Visit: Payer: Self-pay | Admitting: Interventional Cardiology

## 2018-04-30 ENCOUNTER — Encounter

## 2018-04-30 ENCOUNTER — Encounter: Payer: Self-pay | Admitting: Interventional Cardiology

## 2018-04-30 VITALS — BP 112/70 | HR 73 | Ht 66.0 in | Wt 192.0 lb

## 2018-04-30 DIAGNOSIS — I10 Essential (primary) hypertension: Secondary | ICD-10-CM | POA: Diagnosis not present

## 2018-04-30 NOTE — Patient Instructions (Signed)
Medication Instructions:  Your physician recommends that you continue on your current medications as directed. Please refer to the Current Medication list given to you today.  Labwork: None  Testing/Procedures: None  Follow-Up: Your physician wants you to follow-up in: 1 year with Dr. Tamala Julian or as needed.  You will receive a reminder letter in the mail two months in advance. If you don't receive a letter, please call our office to schedule the follow-up appointment.   Any Other Special Instructions Will Be Listed Below (If Applicable).     If you need a refill on your cardiac medications before your next appointment, please call your pharmacy.

## 2018-05-08 MED FILL — DILTIAZEM 24HR ER 180 MG CA: 180 | 60 days supply | Qty: 60 | Fill #0

## 2018-05-08 MED FILL — OMEPRAZOLE 40 MG CPDR: 40 | 30 days supply | Qty: 30 | Fill #0

## 2018-05-22 MED FILL — PREMARIN VAGINAL CREAM-APPL: 0.625 | 60 days supply | Qty: 30 | Fill #1

## 2018-05-22 MED FILL — ESOMEPRAZOLE MAG DR 40 MG C: 40 | 30 days supply | Qty: 30 | Fill #3

## 2018-05-22 MED FILL — ALPRAZolam 0.5 MG TABS: 0.5 | 90 days supply | Qty: 360 | Fill #0

## 2018-06-08 MED FILL — valACYclovir HCL 1 GM TABS: 1 | 5 days supply | Qty: 20 | Fill #0

## 2018-06-08 MED FILL — ESCITALOPRAM 10 MG TABLET: 10 | 30 days supply | Qty: 30 | Fill #2

## 2018-06-08 MED FILL — ESOMEPRAZOLE MAG DR 40 MG C: 40 | 15 days supply | Qty: 30 | Fill #4

## 2018-06-18 MED FILL — SUCRALFATE 1 GM TABLET: 1 | 30 days supply | Qty: 60 | Fill #1

## 2018-06-18 MED FILL — ESOMEPRAZOLE MAG DR 40 MG C: 40 | 38 days supply | Qty: 76 | Fill #5

## 2018-07-02 ENCOUNTER — Ambulatory Visit: Payer: Self-pay | Admitting: Interventional Cardiology

## 2018-07-02 ENCOUNTER — Other Ambulatory Visit: Payer: Self-pay | Admitting: Interventional Cardiology

## 2018-07-02 DIAGNOSIS — I10 Essential (primary) hypertension: Secondary | ICD-10-CM

## 2018-07-02 MED FILL — CARTIA XT 180 MG CP24: 180 | 60 days supply | Qty: 60 | Fill #1

## 2018-07-31 ENCOUNTER — Other Ambulatory Visit: Payer: Self-pay | Admitting: Interventional Cardiology

## 2018-07-31 MED FILL — ESOMEPRAZOLE MAG DR 40 MG C: 40 | 90 days supply | Qty: 180 | Fill #0

## 2018-07-31 MED FILL — LOSARTAN POTASSIUM 100 MG T: 100 | 90 days supply | Qty: 90 | Fill #0

## 2018-08-06 MED FILL — APRISO ER 0.375 G CAPSULE: 0.375 | 90 days supply | Qty: 360 | Fill #0

## 2018-08-06 MED FILL — TRIAMCINOLONE 0.1% CREAM: 0.1 | 10 days supply | Qty: 80 | Fill #0

## 2018-08-07 ENCOUNTER — Ambulatory Visit
Admission: RE | Admit: 2018-08-07 | Discharge: 2018-08-07 | Disposition: A | Discharge status: Home / Self Care | Payer: Managed Care, Other (non HMO) | Source: Ambulatory Visit | Attending: Gastroenterology | Admitting: Gastroenterology

## 2018-08-07 ENCOUNTER — Other Ambulatory Visit: Payer: Self-pay | Admitting: Gastroenterology

## 2018-08-07 ENCOUNTER — Encounter: Payer: Self-pay | Admitting: Family Medicine

## 2018-08-07 DIAGNOSIS — K5901 Slow transit constipation: Secondary | ICD-10-CM

## 2018-08-13 MED FILL — AMOXICILLIN 500 MG CAPSULE: 500 | 3 days supply | Qty: 12 | Fill #0

## 2018-08-22 MED FILL — ALPRAZolam 0.5 MG TABS: 0.5 | 90 days supply | Qty: 360 | Fill #0

## 2018-08-31 MED FILL — CARTIA XT 180 MG CP24: 180 | 60 days supply | Qty: 60 | Fill #2

## 2018-11-05 MED FILL — ESOMEPRAZOLE MAG DR 40 MG C: 40 | 14 days supply | Qty: 28 | Fill #0

## 2018-11-05 MED FILL — CARTIA XT 180 MG CAPSULE SA: 180 | 19 days supply | Qty: 19 | Fill #0

## 2018-11-05 MED FILL — LOSARTAN POTASSIUM 100 MG T: 100 | 25 days supply | Qty: 25 | Fill #1

## 2018-11-08 MED FILL — OSELTAMIVIR PHOSPHATE 75 MG: 75 | 5 days supply | Qty: 10 | Fill #0

## 2018-11-19 ENCOUNTER — Telehealth: Payer: Self-pay | Admitting: Emergency Medicine

## 2018-11-19 MED FILL — LOSARTAN POTASSIUM 100 MG T: 100 | 30 days supply | Qty: 30 | Fill #2

## 2018-11-19 MED FILL — CARTIA XT 180 MG CAPSULE SA: 180 | 90 days supply | Qty: 90 | Fill #1

## 2018-11-19 MED FILL — TRIAMCINOLONE 0.1% CREAM: 0.1 | 10 days supply | Qty: 80 | Fill #1

## 2018-11-19 NOTE — Telephone Encounter (Signed)
Called and spoke with Patient.  She was requesting a refill of Breo.  Patient was last seen by SG 03/29/17.  Patient stated that she tested positive for the  flu 2 weeks ago and was given Tamiflu.  She stated that she still has a lingering cough with white sputum.  She uses Breo as needed, and is currently out. Patient agreed to [place] 11/20/18, with Wyn Quaker, NP at 1000.  Nothing further at this time.

## 2018-11-20 ENCOUNTER — Encounter: Payer: Self-pay | Admitting: Primary Care

## 2018-11-20 ENCOUNTER — Ambulatory Visit: Payer: Managed Care, Other (non HMO) | Admitting: Primary Care

## 2018-11-20 ENCOUNTER — Ambulatory Visit: Payer: Self-pay | Admitting: Pulmonary Disease

## 2018-11-20 DIAGNOSIS — J45909 Unspecified asthma, uncomplicated: Secondary | ICD-10-CM

## 2018-11-20 DIAGNOSIS — J101 Influenza due to other identified influenza virus with other respiratory manifestations: Secondary | ICD-10-CM | POA: Insufficient documentation

## 2018-11-20 MED ORDER — ALBUTEROL SULFATE HFA 108 (90 BASE) MCG/ACT IN AERS: 2.0000 | INHALATION_SPRAY | Freq: Four times a day (QID) | RESPIRATORY_TRACT | 2 refills | Status: DC | PRN

## 2018-11-20 MED ORDER — FLUTICASONE FUROATE-VILANTEROL 200-25 MCG/INH IN AEPB: 1.0000 | INHALATION_SPRAY | Freq: Every day | RESPIRATORY_TRACT | 11 refills | Status: DC | PRN

## 2018-11-20 MED ORDER — FLUTICASONE FUROATE-VILANTEROL 200-25 MCG/INH IN AEPB: 1.0000 | INHALATION_SPRAY | Freq: Every day | RESPIRATORY_TRACT | 0 refills | Status: DC

## 2018-11-20 MED FILL — BREO ELLIPTA 200-25 MCG INH: 200-25 | 30 days supply | Qty: 60 | Fill #0

## 2018-11-20 MED FILL — PREMARIN VAGINAL CREAM-APPL: 0.625 | 60 days supply | Qty: 30 | Fill #2

## 2018-11-20 MED FILL — PROAIR HFA 90 MCG INHALER: 108 (90 BAS | 30 days supply | Qty: 9 | Fill #0

## 2018-11-20 NOTE — Progress Notes (Signed)
@Patient  ID: Unknown Nicole [name], female    DOB: 06/07/1956, 63 y.o.   MRN: [id number]  Chief Complaint  Patient presents with  . Follow-up    States she was given tamilfu 12/26. She still has a cough with frothy white mucous.     Referring provider: Gaynelle Arabian, MD  HPI: 63 year old female, former smoker quit in 2001(40 pack year hx). PMH significant for asthma, irritable larynx, hypertension. Patient of Dr. Lamonte Sakai, last seen by pulmonary Np on 03/29/17.   11/20/2018 Patient presents today for ROV and refill Breo. Reports that she recently tested positive for Influenza A in late December, treated with tamiflu. Her only symptoms was chills. Overall, she feels well. Has a lingering dry cough. No shortness of breath or wheezing. Afebrile.   Allergies  Allergen Reactions  . Hctz [Hydrochlorothiazide] Other (See Comments)    MUSCLE CRAMPING  . Hydralazine Hcl Other (See Comments)    Cramping  . Metoclopramide Hcl Other (See Comments)    Cramping, anxiety and depression; "makes me crazy"  . Other Other (See Comments)    Cramping  . Tramadol Other (See Comments)    "wig out"  . Voltaren [Diclofenac Sodium] Other (See Comments)    Swelling in leg and became parched.   . Pristiq [Desvenlafaxine Succinate Er]   . Avelox [Moxifloxacin Hcl In Nacl] Nausea And Vomiting  . Clindamycin Other (See Comments)  . Clindamycin/Lincomycin Rash  . Flagyl [Metronidazole] Rash  . Moxifloxacin Other (See Comments)  . Neosporin [Neomycin-Bacitracin Zn-Polymyx] Other (See Comments)    Blisters, rash    Immunization History  Administered Date(s) Administered  . Influenza Whole 07/16/2011  . Influenza,inj,Quad PF,6+ Mos 08/14/2015    Past Medical History:  Diagnosis Date  . Anxiety   . Arthritis    lumbar- HNP, /w myelopathy  . Asthma    /w reflux , consult /w Dr. Wert-2013 / USES INHALER PRN INFREQUENTLY  . Bronchitis    used albuterol , Sept. 2013, all clear now   . Complication of  anesthesia   . Difficulty sleeping    DUE TO PAIN  . Family history of anesthesia complication    N&V- Mother   . GERD (gastroesophageal reflux disease)    Chron's disease, not medically treating currently   . H/O hiatal hernia    small hiatal hernia, has had endoscopy & colondoscopy  . H/O vertigo   . Heart murmur    since birth  . Hypertension    followed by Dr. Marisue Humble   . Numbness in left leg   . PONV (postoperative nausea and vomiting)   . Stress incontinence     Tobacco History: Social History   Tobacco Use  Smoking Status Former Smoker  . Packs/day: 1.50  . Years: 27.00  . Pack years: 40.50  . Types: Cigarettes  . Last attempt to quit: 11/15/1999  . Years since quitting: 19.0  Smokeless Tobacco Never Used   Counseling given: Not Answered   Outpatient Medications Prior to Visit  Medication Sig Dispense Refill  . ALPRAZolam (XANAX) 0.5 MG tablet Take 0.5 mg by mouth at bedtime.    Marland Kitchen diltiazem (CARTIA XT) 180 MG 24 hr capsule Take 1 capsule (180 mg total) by mouth daily. 180 capsule 3  . escitalopram (LEXAPRO) 10 MG tablet Take 5 mg by mouth daily.   12  . esomeprazole (NEXIUM) 40 MG capsule Take 40 mg by mouth daily.  1  . fluticasone (FLONASE) 50 MCG/ACT nasal spray Place  2 sprays into both nostrils daily as needed (For cold symptoms).    . losartan (COZAAR) 100 MG tablet Take 1 tablet (100 mg total) by mouth daily. 90 tablet 2  . Sucralfate (CARAFATE PO) Take 1 tablet by mouth daily.    . valACYclovir (VALTREX) 1000 MG tablet Take 1,000 mg by mouth daily.  0  . albuterol (PROVENTIL HFA;VENTOLIN HFA) 108 (90 Base) MCG/ACT inhaler Inhale 2 puffs into the lungs every 6 (six) hours as needed for wheezing or shortness of breath. 1 Inhaler 2  . fluticasone furoate-vilanterol (BREO ELLIPTA) 200-25 MCG/INH AEPB Inhale 1 puff into the lungs daily as needed (For cold symptoms).     No facility-administered medications prior to visit.     Review of Systems  Review of  Systems  Constitutional: Negative.  Negative for activity change, chills and fever.  HENT: Negative.   Respiratory: Positive for cough. Negative for shortness of breath and wheezing.   Cardiovascular: Negative.     Physical Exam  BP 128/82   Pulse 71   Temp 98.4 F (36.9 C) (Oral)   Ht 5\' 6"  (1.676 m)   Wt 198 lb (89.8 kg)   SpO2 98%   BMI 31.96 kg/m  Physical Exam Constitutional:      Appearance: Normal appearance.  HENT:     Head: Normocephalic and atraumatic.     Right Ear: Tympanic membrane normal.     Left Ear: Tympanic membrane normal.     Mouth/Throat:     Mouth: Mucous membranes are moist.     Pharynx: Oropharynx is clear.  Eyes:     Extraocular Movements: Extraocular movements intact.     Pupils: Pupils are equal, round, and reactive to light.  Cardiovascular:     Rate and Rhythm: Normal rate and regular rhythm.  Pulmonary:     Effort: Pulmonary effort is normal.     Breath sounds: Normal breath sounds. No wheezing or rhonchi.  Neurological:     Mental Status: She is alert.  Psychiatric:        Mood and Affect: Mood normal.        Behavior: Behavior normal.        Thought Content: Thought content normal.        Judgment: Judgment normal.      Lab Results:  CBC    Component Value Date/Time   WBC 17.5 (H) 12/26/2014 0530   RBC 3.57 (L) 12/26/2014 0530   HGB 10.8 (L) 12/26/2014 0530   HCT 33.3 (L) 12/26/2014 0530   PLT 275 12/26/2014 0530   MCV 93.3 12/26/2014 0530   MCH 30.3 12/26/2014 0530   MCHC 32.4 12/26/2014 0530   RDW 13.3 12/26/2014 0530   LYMPHSABS 2.1 03/16/2010 1740   MONOABS 0.5 03/16/2010 1740   EOSABS 0.0 03/16/2010 1740   BASOSABS 0.0 03/16/2010 1740    BMET    Component Value Date/Time   NA 141 12/26/2014 0530   K 3.8 12/26/2014 0530   CL 108 12/26/2014 0530   CO2 26 12/26/2014 0530   GLUCOSE 125 (H) 12/26/2014 0530   BUN 14 12/26/2014 0530   CREATININE 0.66 12/26/2014 0530   CALCIUM 8.8 12/26/2014 0530   GFRNONAA >90  12/26/2014 0530   GFRAA >90 12/26/2014 0530    BNP No results found for: BNP  ProBNP No results found for: PROBNP  Imaging: No results found.   Assessment & Plan:   Influenza A Tested positive for influenza A on 11/08/18. Completed Tamiflu course.  Patient has lingering cough, exam benign with clear lungs. Afebrile  Recommend delsym 5-41ml every 12 hours prn cough  Intrinsic asthma Stable, not currently exacerbated Needs refill of Breo inhaler Annual follow-up recommended     Martyn Ehrich, NP 11/20/2018

## 2018-11-20 NOTE — Assessment & Plan Note (Addendum)
Stable, not currently exacerbated Needs refill of Breo inhaler Annual follow-up recommended

## 2018-11-20 NOTE — Assessment & Plan Note (Addendum)
Tested positive for influenza A on 11/08/18. Completed Tamiflu course.  Patient has lingering cough, exam benign with clear lungs. Afebrile  Recommend delsym 5-32ml every 12 hours prn cough

## 2018-11-20 NOTE — Patient Instructions (Signed)
Delsym 5-36ml every 12 hours for cough  Refill Breo and Albuterol rescue inhaler   Follow up as needed if if symptoms worsen or do not improve in 7-10 days   1 year follow up with Dr. Lamonte Sakai

## 2018-12-13 MED FILL — BREO ELLIPTA 200-25 MCG INH: 200-25 | 30 days supply | Qty: 60 | Fill #1

## 2018-12-13 MED FILL — LOSARTAN POTASSIUM 100 MG T: 100 | 30 days supply | Qty: 30 | Fill #3

## 2018-12-18 ENCOUNTER — Other Ambulatory Visit: Payer: Self-pay | Admitting: Gastroenterology

## 2018-12-18 DIAGNOSIS — R1031 Right lower quadrant pain: Secondary | ICD-10-CM

## 2018-12-18 MED FILL — OSCIMIN SR 0.375 MG TABLET: 0.375 | 30 days supply | Qty: 60 | Fill #0

## 2018-12-18 MED FILL — valACYclovir HCL 1 GM TABS: 1 | 5 days supply | Qty: 20 | Fill #1

## 2018-12-18 MED FILL — PANTOPRAZOLE SOD DR 40 MG T: 40 | 30 days supply | Qty: 30 | Fill #0

## 2018-12-27 ENCOUNTER — Other Ambulatory Visit: Payer: Self-pay

## 2018-12-28 ENCOUNTER — Ambulatory Visit
Admission: RE | Admit: 2018-12-28 | Discharge: 2018-12-28 | Disposition: A | Discharge status: Home / Self Care | Payer: Managed Care, Other (non HMO) | Source: Ambulatory Visit | Attending: Gastroenterology | Admitting: Gastroenterology

## 2018-12-28 DIAGNOSIS — R1031 Right lower quadrant pain: Secondary | ICD-10-CM

## 2018-12-28 MED ORDER — IOPAMIDOL (ISOVUE-300) INJECTION 61%
100.0000 mL | Freq: Once | INTRAVENOUS | Status: AC | PRN
  Administered 2018-12-28: 100 mL via INTRAVENOUS

## 2019-01-01 ENCOUNTER — Other Ambulatory Visit: Payer: Self-pay

## 2019-01-15 MED FILL — MIRTAZAPINE 30 MG TABLET: 30 | 90 days supply | Qty: 90 | Fill #0

## 2019-01-21 MED FILL — LOSARTAN POTASSIUM 100 MG T: 100 | 30 days supply | Qty: 30 | Fill #4

## 2019-01-28 MED FILL — valACYclovir HCL 1 GM TABS: 1 | 22 days supply | Qty: 90 | Fill #0

## 2019-02-18 ENCOUNTER — Other Ambulatory Visit: Payer: Self-pay | Admitting: Interventional Cardiology

## 2019-02-18 MED ORDER — LOSARTAN POTASSIUM 100 MG PO TABS: 100.0000 mg | ORAL_TABLET | Freq: Every day | ORAL | 1 refills | Status: DC

## 2019-02-18 MED FILL — LOSARTAN POTASSIUM 100 MG T: 100 | 90 days supply | Qty: 90 | Fill #0

## 2019-02-18 MED FILL — CARTIA XT 180 MG CAPSULE SA: 180 | 90 days supply | Qty: 90 | Fill #2

## 2019-03-09 MED FILL — ALPRAZolam 0.5 MG TABS: 0.5 | 90 days supply | Qty: 270 | Fill #0

## 2019-03-09 MED FILL — TRIAMCINOLONE 0.1% CREAM: 0.1 | 10 days supply | Qty: 80 | Fill #2

## 2019-03-11 MED FILL — PREMARIN VAGINAL CREAM-APPL: 0.625 | 60 days supply | Qty: 30 | Fill #0

## 2019-03-11 MED FILL — valACYclovir HCL 1 GM TABS: 1 | 22 days supply | Qty: 90 | Fill #0

## 2019-03-18 MED FILL — IBUPROFEN 800 MG TAB: 800 | 30 days supply | Qty: 90 | Fill #0

## 2019-04-16 DIAGNOSIS — M25561 Pain in right knee: Secondary | ICD-10-CM | POA: Insufficient documentation

## 2019-04-17 ENCOUNTER — Telehealth: Payer: Self-pay

## 2019-04-17 NOTE — Telephone Encounter (Signed)
YOUR CARDIOLOGY TEAM HAS ARRANGED FOR AN E-VISIT FOR YOUR APPOINTMENT - PLEASE REVIEW IMPORTANT INFORMATION BELOW SEVERAL DAYS PRIOR TO YOUR APPOINTMENT  Due to the recent COVID-19 pandemic, we are transitioning in-person office visits to tele-medicine visits in an effort to decrease unnecessary exposure to our patients, their families, and staff. These visits are billed to your insurance just like a normal visit is. We also encourage you to sign up for MyChart if you have not already done so. You will need a smartphone if possible. For patients that do not have this, we can still complete the visit using a regular telephone but do prefer a smartphone to enable video when possible. You may have a family member that lives with you that can help. If possible, we also ask that you have a blood pressure cuff and scale at home to measure your blood pressure, heart rate and weight prior to your scheduled appointment. Patients with clinical needs that need an in-person evaluation and testing will still be able to come to the office if absolutely necessary. If you have any questions, feel free to call our office.     YOUR PROVIDER WILL BE USING THE FOLLOWING PLATFORM TO COMPLETE YOUR VISIT: Doximity  . IF USING MYCHART - How to Download the MyChart App to Your SmartPhone   - If Apple, go to App Store and type in MyChart in the search bar and download the app. If Android, ask patient to go to Google Play Store and type in MyChart in the search bar and download the app. The app is free but as with any other app downloads, your phone may require you to verify saved payment information or Apple/Android password.  - You will need to then log into the app with your MyChart username and password, and select [facility] as your healthcare provider to link the account.  - When it is time for your visit, go to the MyChart app, find appointments, and click Begin Video Visit. Be sure to Select Allow for your device to  access the Microphone and Camera for your visit. You will then be connected, and your provider will be with you shortly.  **If you have any issues connecting or need assistance, please contact MyChart service desk [phone] ([phone])**  **If using a computer, in order to ensure the best quality for your visit, you will need to use either of the following Internet Browsers: Google Chrome or Microsoft Edge**  . IF USING DOXIMITY or DOXY.ME - The staff will give you instructions on receiving your link to join the meeting the day of your visit.      2-3 DAYS BEFORE YOUR APPOINTMENT  You will receive a telephone call from one of our [facility] team members - your caller ID may say "Unknown caller." If this is a video visit, we will walk you through how to get the video launched on your phone. We will remind you check your blood pressure, heart rate and weight prior to your scheduled appointment. If you have an Apple Watch or Kardia, please upload any pertinent ECG strips the day before or morning of your appointment to MyChart. Our staff will also make sure you have reviewed the consent and agree to move forward with your scheduled tele-health visit.     THE DAY OF YOUR APPOINTMENT  Approximately 15 minutes prior to your scheduled appointment, you will receive a telephone call from one of Heart[facility] team - your caller ID may say "Unknown caller."    Our staff will confirm medications, vital signs for the day and any symptoms you may be experiencing. Please have this information available prior to the time of visit start. It may also be helpful for you to have a pad of paper and pen handy for any instructions given during your visit. They will also walk you through joining the smartphone meeting if this is a video visit.    CONSENT FOR TELE-HEALTH VISIT - PLEASE REVIEW  I hereby voluntarily request, consent and authorize [facility] and its employed or contracted physicians, physician  assistants, nurse practitioners or other licensed health care professionals (the Practitioner), to provide me with telemedicine health care services (the "Services") as deemed necessary by the treating Practitioner. I acknowledge and consent to receive the Services by the Practitioner via telemedicine. I understand that the telemedicine visit will involve communicating with the Practitioner through live audiovisual communication technology and the disclosure of certain medical information by electronic transmission. I acknowledge that I have been given the opportunity to request an in-person assessment or other available alternative prior to the telemedicine visit and am voluntarily participating in the telemedicine visit.  I understand that I have the right to withhold or withdraw my consent to the use of telemedicine in the course of my care at any time, without affecting my right to future care or treatment, and that the Practitioner or I may terminate the telemedicine visit at any time. I understand that I have the right to inspect all information obtained and/or recorded in the course of the telemedicine visit and may receive copies of available information for a reasonable fee.  I understand that some of the potential risks of receiving the Services via telemedicine include:  . Delay or interruption in medical evaluation due to technological equipment failure or disruption; . Information transmitted may not be sufficient (e.g. poor resolution of images) to allow for appropriate medical decision making by the Practitioner; and/or  . In rare instances, security protocols could fail, causing a breach of personal health information.  Furthermore, I acknowledge that it is my responsibility to provide information about my medical history, conditions and care that is complete and accurate to the best of my ability. I acknowledge that Practitioner's advice, recommendations, and/or decision may be based on  factors not within their control, such as incomplete or inaccurate data provided by me or distortions of diagnostic images or specimens that may result from electronic transmissions. I understand that the practice of medicine is not an exact science and that Practitioner makes no warranties or guarantees regarding treatment outcomes. I acknowledge that I will receive a copy of this consent concurrently upon execution via email to the email address I last provided but may also request a printed copy by calling the office of [facility].    I understand that my insurance will be billed for this visit.   I have read or had this consent read to me. . I understand the contents of this consent, which adequately explains the benefits and risks of the Services being provided via telemedicine.  . I have been provided ample opportunity to ask questions regarding this consent and the Services and have had my questions answered to my satisfaction. . I give my informed consent for the services to be provided through the use of telemedicine in my medical care  By participating in this telemedicine visit I agree to the above.  

## 2019-04-21 NOTE — Progress Notes (Signed)
Virtual Visit via Video Note   This visit type was conducted due to national recommendations for restrictions regarding the COVID-19 Pandemic (e.g. social distancing) in an effort to limit this patient's exposure and mitigate transmission in our community.  Due to her co-morbid illnesses, this patient is at least at moderate risk for complications without adequate follow up.  This format is felt to be most appropriate for this patient at this time.  All issues noted in this document were discussed and addressed.  A limited physical exam was performed with this format.  Please refer to the patient's chart for her consent to telehealth for [facility].   Date:  04/21/2019   ID:  Nicole [name], DOB 1956/07/20, MRN [id number]  Patient Location: Home Provider Location: Home  PCP:  Gaynelle Arabian, MD  Cardiologist:  No primary care provider on file.  Electrophysiologist:  None   Evaluation Performed:  Follow-Up Visit  Chief Complaint:  Hypertension / Murmur  History of Present Illness:    Nicole [name] is a 63 y.o. female with hypertension, murmur, and asthma.  Physically, she is doing relatively well.  She denies chest pain, dyspnea, and other cardiac symptoms.  Emotionally she is under stress due to the current state of her father and mother's overall health.  Her work situation is also atypical in that she works 312-hour shifts per week at at [employer].  This throws off her sleep problem.  She is used to working nights and her job requires that she works days.  The patient does not have symptoms concerning for COVID-19 infection (fever, chills, cough, or new shortness of breath).    Past Medical History:  Diagnosis Date  . Anxiety   . Arthritis    lumbar- HNP, /w myelopathy  . Asthma    /w reflux , consult /w Dr. Wert-2013 / USES INHALER PRN INFREQUENTLY  . Bronchitis    used albuterol , Sept. 2013, all clear now   . Complication of anesthesia   . Difficulty sleeping    DUE TO PAIN  . Family history of anesthesia complication    N&V- Mother   . GERD (gastroesophageal reflux disease)    Chron's disease, not medically treating currently   . H/O hiatal hernia    small hiatal hernia, has had endoscopy & colondoscopy  . H/O vertigo   . Heart murmur    since birth  . Hypertension    followed by Dr. Marisue Humble   . Numbness in left leg   . PONV (postoperative nausea and vomiting)   . Stress incontinence    Past Surgical History:  Procedure Laterality Date  . BACK SURGERY  2013  . CHOLECYSTECTOMY  03   laparoscopic  . LUMBAR LAMINECTOMY/DECOMPRESSION MICRODISCECTOMY  09/10/2012   Procedure: LUMBAR LAMINECTOMY/DECOMPRESSION MICRODISCECTOMY 1 LEVEL;  Surgeon: Kristeen Miss, MD;  Location: [facility];  Service: Neurosurgery;  Laterality: Left;  Left Lumbar Five-Sacral One Microdiscectomy  . TOTAL HIP ARTHROPLASTY Left 12/24/2014   Procedure: LEFT TOTAL HIP ARTHROPLASTY ANTERIOR APPROACH;  Surgeon: Mauri Pole, MD;  Location: [facility];  Service: Orthopedics;  Laterality: Left;     No outpatient medications have been marked as taking for the 04/22/19 encounter (Appointment) with Belva Crome, MD.     Allergies:   Hctz [hydrochlorothiazide]; Hydralazine hcl; Metoclopramide hcl; Other; Tramadol; Voltaren [diclofenac sodium]; Pristiq [desvenlafaxine succinate er]; Avelox [moxifloxacin hcl in nacl]; Clindamycin; Clindamycin/lincomycin; Flagyl [metronidazole]; Moxifloxacin; and Neosporin [neomycin-bacitracin zn-polymyx]   Social History  Tobacco Use  . Smoking status: Former Smoker    Packs/day: 1.50    Years: 27.00    Pack years: 40.50    Types: Cigarettes    Last attempt to quit: 11/15/1999    Years since quitting: 19.4  . Smokeless tobacco: Never Used  Substance Use Topics  . Alcohol use: Yes    Comment: Seldom drinks, but occasional wine.  . Drug use: No     Family Hx: The patient's family history includes Asthma in her mother; Diabetes in her  father; Glaucoma in her mother; Hypertension in her father and sister; Kidney disease in her father and mother; Stroke in her father; Thyroid disease in her mother.  ROS:   Please see the history of present illness.    Anxiety and stress.  Dizziness and recurring vertigo.  Wants a neurology referral pill. All other systems reviewed and are negative.   Prior CV studies:   The following studies were reviewed today:  No recent cardiac imaging  Labs/Other Tests and Data Reviewed:    EKG:  No ECG reviewed.  Recent Labs: No results found for requested labs within last 8760 hours.   Recent Lipid Panel No results found for: CHOL, TRIG, HDL, CHOLHDL, LDLCALC, LDLDIRECT  Wt Readings from Last 3 Encounters:  11/20/18 198 lb (89.8 kg)  04/30/18 192 lb (87.1 kg)  06/09/17 198 lb (89.8 kg)     Objective:    Vital Signs:  There were no vitals taken for this visit.   VITAL SIGNS:  reviewed GEN:  no acute distress RESPIRATORY:  normal respiratory effort, symmetric expansion CARDIOVASCULAR:  no peripheral edema NEURO:  alert and oriented x 3, no obvious focal deficit  ASSESSMENT & PLAN:    1. Essential hypertension   2. Mild persistent asthma with acute exacerbation     PLAN:  1. Blood pressure is adequately controlled.  We discussed salt restriction, aerobic activity, and weight control.  Also discussed secondary cardiovascular risk reduction.  Overall education and awareness concerning primary/secondary risk prevention was discussed in detail: LDL less than 70, hemoglobin A1c less than 7, blood pressure target less than 130/80 mmHg, >150 minutes of moderate aerobic activity per week, avoidance of smoking, weight control (via diet and exercise), and continued surveillance/management of/for obstructive sleep apnea.   Clinical follow-up in 1 year. COVID-19 Education: The signs and symptoms of COVID-19 were discussed with the patient and how to seek care for testing (follow up with  PCP or arrange E-visit).  The importance of social distancing was discussed today.  Time:   Today, I have spent 10 minutes with the patient with telehealth technology discussing the above problems.     Medication Adjustments/Labs and Tests Ordered: Current medicines are reviewed at length with the patient today.  Concerns regarding medicines are outlined above.   Tests Ordered: No orders of the defined types were placed in this encounter.   Medication Changes: No orders of the defined types were placed in this encounter.   Disposition:  Follow up in 1 year(s)  Signed, Sinclair Grooms, MD  04/21/2019 9:32 AM    [facility]

## 2019-04-22 ENCOUNTER — Other Ambulatory Visit: Payer: Self-pay

## 2019-04-22 ENCOUNTER — Encounter: Payer: Self-pay | Admitting: Interventional Cardiology

## 2019-04-22 ENCOUNTER — Telehealth (INDEPENDENT_AMBULATORY_CARE_PROVIDER_SITE_OTHER): Payer: Managed Care, Other (non HMO) | Admitting: Interventional Cardiology

## 2019-04-22 VITALS — BP 138/88 | Ht 66.0 in | Wt 194.0 lb

## 2019-04-22 DIAGNOSIS — I10 Essential (primary) hypertension: Secondary | ICD-10-CM

## 2019-04-22 DIAGNOSIS — J4531 Mild persistent asthma with (acute) exacerbation: Secondary | ICD-10-CM

## 2019-04-22 NOTE — Patient Instructions (Signed)
Medication Instructions:  Your physician recommends that you continue on your current medications as directed. Please refer to the Current Medication list given to you today.  If you need a refill on your cardiac medications before your next appointment, please call your pharmacy.   Lab work: None If you have labs (blood work) drawn today and your tests are completely normal, you will receive your results only by: Marland Kitchen MyChart Message (if you have MyChart) OR . A paper copy in the mail If you have any lab test that is abnormal or we need to change your treatment, we will call you to review the results.  Testing/Procedures: None  Follow-Up: At [facility], you and your health needs are our priority.  As part of our continuing mission to provide you with exceptional heart care, we have created designated Provider Care Teams.  These Care Teams include your primary Cardiologist (physician) and Advanced Practice Providers (APPs -  Physician Assistants and Nurse Practitioners) who all work together to provide you with the care you need, when you need it. You will need a follow up appointment in 12 months.  Please call our office 2 months in advance to schedule this appointment.  You may see Dr. Tamala Julian or one of the following Advanced Practice Providers on your designated Care Team:   Truitt Merle, NP Cecilie Kicks, NP . Kathyrn Drown, NP  Any Other Special Instructions Will Be Listed Below (If Applicable).  Your provider recommends that you maintain 150 minutes per week of moderate aerobic activity.

## 2019-05-23 MED FILL — CARTIA XT 180 MG CAPSULE SA: 180 | 90 days supply | Qty: 90 | Fill #3

## 2019-05-23 MED FILL — LOSARTAN POTASSIUM 100 MG T: 100 | 90 days supply | Qty: 90 | Fill #1

## 2019-07-12 MED FILL — AMOXICILLIN 500 MG CAPSULE: 500 | 5 days supply | Qty: 20 | Fill #0

## 2019-07-14 MED FILL — IBUPROFEN 800 MG TAB: 800 | 30 days supply | Qty: 90 | Fill #1

## 2019-07-23 ENCOUNTER — Other Ambulatory Visit: Payer: Self-pay | Admitting: Interventional Cardiology

## 2019-07-23 DIAGNOSIS — I10 Essential (primary) hypertension: Secondary | ICD-10-CM

## 2019-07-24 MED FILL — CARTIA XT 180 MG CAPSULE SA: 180 | 90 days supply | Qty: 90 | Fill #0

## 2019-07-24 MED FILL — LOSARTAN POTASSIUM 100 MG T: 100 | 90 days supply | Qty: 90 | Fill #0

## 2019-07-24 MED FILL — valACYclovir HCL 1 GM TABS: 1 | 22 days supply | Qty: 90 | Fill #0

## 2019-08-09 MED FILL — MUPIROCIN 2% OINTMENT: 2 | 7 days supply | Qty: 22 | Fill #0

## 2019-09-24 MED FILL — NYSTATIN-TRIAMCINOLONE OINT: 100000-0.1 | 15 days supply | Qty: 30 | Fill #0

## 2019-09-24 MED FILL — CEPHALEXIN 500 MG CAPSULE: 500 | 10 days supply | Qty: 20 | Fill #0

## 2019-09-30 MED FILL — MESALAMINE ER 0.375 GM CP24: 0.375 | 90 days supply | Qty: 360 | Fill #0 | Status: TO

## 2019-09-30 MED FILL — MESALAMINE ER 0.375 GM CP24: 0.375 | 90 days supply | Qty: 360 | Fill #0

## 2019-11-20 MED FILL — valACYclovir HCL 1 GM TABS: 1 | 22 days supply | Qty: 90 | Fill #1

## 2019-11-20 MED FILL — DILTIAZEM HCL ER COATED BEA: 180 | 30 days supply | Qty: 30 | Fill #1

## 2019-11-20 MED FILL — LOSARTAN POTASSIUM 100 MG T: 100 | 30 days supply | Qty: 30 | Fill #1

## 2019-12-18 MED FILL — LOSARTAN POTASSIUM 100 MG T: 100 | 30 days supply | Qty: 30 | Fill #2

## 2019-12-18 MED FILL — DILTIAZEM HCL ER COATED BEA: 180 | 30 days supply | Qty: 30 | Fill #2

## 2019-12-18 MED FILL — ALPRAZolam 0.5 MG TABS: 0.5 | 30 days supply | Qty: 90 | Fill #0

## 2020-01-27 MED FILL — LOSARTAN POTASSIUM 100 MG T: 100 | 30 days supply | Qty: 30 | Fill #3

## 2020-01-27 MED FILL — ALPRAZolam 0.5 MG TABS: 0.5 | 30 days supply | Qty: 90 | Fill #1

## 2020-01-27 MED FILL — DILTIAZEM HCL ER COATED BEA: 180 | 30 days supply | Qty: 30 | Fill #3

## 2020-02-12 ENCOUNTER — Emergency Department (HOSPITAL_COMMUNITY)
Admission: EM | Admit: 2020-02-12 | Discharge: 2020-02-12 | Disposition: A | Discharge status: Home / Self Care | Payer: BC Managed Care – PPO | Attending: Emergency Medicine | Admitting: Emergency Medicine

## 2020-02-12 ENCOUNTER — Other Ambulatory Visit: Payer: Self-pay

## 2020-02-12 ENCOUNTER — Emergency Department (HOSPITAL_COMMUNITY): Payer: BC Managed Care – PPO

## 2020-02-12 ENCOUNTER — Encounter (HOSPITAL_COMMUNITY): Payer: Self-pay

## 2020-02-12 DIAGNOSIS — Z79899 Other long term (current) drug therapy: Secondary | ICD-10-CM | POA: Diagnosis not present

## 2020-02-12 DIAGNOSIS — R1084 Generalized abdominal pain: Secondary | ICD-10-CM | POA: Diagnosis present

## 2020-02-12 DIAGNOSIS — R1900 Intra-abdominal and pelvic swelling, mass and lump, unspecified site: Secondary | ICD-10-CM | POA: Insufficient documentation

## 2020-02-12 DIAGNOSIS — Z87891 Personal history of nicotine dependence: Secondary | ICD-10-CM | POA: Diagnosis not present

## 2020-02-12 DIAGNOSIS — K529 Noninfective gastroenteritis and colitis, unspecified: Secondary | ICD-10-CM | POA: Insufficient documentation

## 2020-02-12 LAB — CBC
HCT: 42.2 % (ref 36.0–46.0)
Hemoglobin: 13.9 g/dL (ref 12.0–15.0)
MCH: 30.9 pg (ref 26.0–34.0)
MCHC: 32.9 g/dL (ref 30.0–36.0)
MCV: 93.8 fL (ref 80.0–100.0)
Platelets: 289 10*3/uL (ref 150–400)
RBC: 4.5 MIL/uL (ref 3.87–5.11)
RDW: 12.9 % (ref 11.5–15.5)
WBC: 13.5 10*3/uL — ABNORMAL HIGH (ref 4.0–10.5)
nRBC: 0 % (ref 0.0–0.2)

## 2020-02-12 LAB — COMPREHENSIVE METABOLIC PANEL
ALT: 15 U/L (ref 0–44)
AST: 20 U/L (ref 15–41)
Albumin: 4.1 g/dL (ref 3.5–5.0)
Alkaline Phosphatase: 60 U/L (ref 38–126)
Anion gap: 10 (ref 5–15)
BUN: 9 mg/dL (ref 8–23)
CO2: 26 mmol/L (ref 22–32)
Calcium: 9.7 mg/dL (ref 8.9–10.3)
Chloride: 102 mmol/L (ref 98–111)
Creatinine, Ser: 0.82 mg/dL (ref 0.44–1.00)
GFR calc Af Amer: >60 mL/min (ref >60)
GFR calc non Af Amer: >60 mL/min (ref >60)
Glucose, Bld: 133 mg/dL — ABNORMAL HIGH (ref 70–99)
Potassium: 4 mmol/L (ref 3.5–5.1)
Sodium: 138 mmol/L (ref 135–145)
Total Bilirubin: 1 mg/dL (ref 0.3–1.2)
Total Protein: 7.7 g/dL (ref 6.5–8.1)

## 2020-02-12 LAB — URINALYSIS, ROUTINE W REFLEX MICROSCOPIC
Bilirubin Urine: NEGATIVE
Glucose, UA: NEGATIVE mg/dL
Ketones, ur: 5 mg/dL — AB
Leukocytes,Ua: NEGATIVE
Nitrite: NEGATIVE
Protein, ur: NEGATIVE mg/dL
Specific Gravity, Urine: 1.01 (ref 1.005–1.030)
pH: 6 (ref 5.0–8.0)

## 2020-02-12 LAB — LIPASE, BLOOD: Lipase: 23 U/L (ref 11–51)

## 2020-02-12 MED ORDER — IOHEXOL 300 MG/ML  SOLN
100.0000 mL | Freq: Once | INTRAMUSCULAR | Status: AC | PRN
  Administered 2020-02-12: 100 mL via INTRAVENOUS

## 2020-02-12 MED ORDER — PREDNISONE 20 MG PO TABS
40.0000 mg | ORAL_TABLET | Freq: Once | ORAL | Status: AC
  Administered 2020-02-12: 40 mg via ORAL
  Filled 2020-02-12: qty 2

## 2020-02-12 MED ORDER — PREDNISONE 20 MG PO TABS: 20.0000 mg | ORAL_TABLET | Freq: Two times a day (BID) | ORAL | 0 refills | Status: DC

## 2020-02-12 MED ORDER — SODIUM CHLORIDE 0.9 % IV SOLN: INTRAVENOUS | Status: DC

## 2020-02-12 MED ORDER — SODIUM CHLORIDE 0.9 % IV BOLUS
1000.0000 mL | Freq: Once | INTRAVENOUS | Status: AC
  Administered 2020-02-12: 1000 mL via INTRAVENOUS

## 2020-02-12 MED FILL — predniSONE 20 MG TABS: 20 | 5 days supply | Qty: 10 | Fill #0

## 2020-02-12 NOTE — Discharge Instructions (Signed)
Stay on a clear liquid diet, until you see Dr. Watt Climes next week.  Call tomorrow for an appointment.  Start the prednisone prescription tomorrow.  Make sure you take your mesalamine, for each day as prescribed.  Return here, if needed, for problems.  See your gynecologist about the pelvic cyst.

## 2020-02-12 NOTE — ED Notes (Signed)
Verbalized understanding of DC instructions, Rx, follow up care with Dr Watt Climes

## 2020-02-12 NOTE — ED Notes (Signed)
Water provided to patient

## 2020-02-12 NOTE — ED Provider Notes (Addendum)
[facility] DEPART[facility] Provider Note   CSN: [id number] Arrival date & time: 02/12/20  1043     History Chief Complaint  Patient presents with  . Abdominal Pain    Nicole [name] is a 64 y.o. female.  HPI She complains of persistent abdominal pain with swallowing and vomiting.  Onset of symptoms yesterday morning, worsening, initially epigastric then generalized.  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  Today she was unable to work because of persistent symptoms including abdominal pain, swelling and vomiting.  No blood in emesis.  Last bowel movement was 2 days ago.  No fever or chills.   She has been prescribed mesalamine, for ileitis, and typically takes it 2 out of 3 days.  Sometimes she stops because it tends to cause her constipation.  No prior intestinal surgery.  History of cholecystectomy.  There are no other known modifying factors.    Past Medical History:  Diagnosis Date  . Anxiety   . Arthritis    lumbar- HNP, /w myelopathy  . Asthma    /w reflux , consult /w Dr. Wert-2013 / USES INHALER PRN INFREQUENTLY  . Bronchitis    used albuterol , Sept. 2013, all clear now   . Complication of anesthesia   . Difficulty sleeping    DUE TO PAIN  . Family history of anesthesia complication    N&V- Mother   . GERD (gastroesophageal reflux disease)    Chron's disease, not medically treating currently   . H/O hiatal hernia    small hiatal hernia, has had endoscopy & colondoscopy  . H/O vertigo   . Heart murmur    since birth  . Hypertension    followed by Dr. Marisue Humble   . Numbness in left leg   . PONV (postoperative nausea and vomiting)   . Stress incontinence     Patient Active Problem List   Diagnosis Date Noted  . Influenza A 11/20/2018  . Asthma with acute exacerbation 07/25/2016  . Post-nasal drip 07/25/2016  . 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 Soft tissue abscess of inguinal region  09/26/2015  . 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 Dizziness and giddiness 02/07/2013  . Cough 09/23/2011  . Hypertension 08/11/2011  . Obesity 08/11/2011    Past Surgical History:  Procedure Laterality Date  . BACK SURGERY  2013  . CHOLECYSTECTOMY  03   laparoscopic  . LUMBAR LAMINECTOMY/DECOMPRESSION MICRODISCECTOMY  09/10/2012   Procedure: LUMBAR LAMINECTOMY/DECOMPRESSION MICRODISCECTOMY 1 LEVEL;  Surgeon: Kristeen Miss, MD;  Location: [facility];  Service: Neurosurgery;  Laterality: Left;  Left Lumbar Five-Sacral One Microdiscectomy  . TOTAL HIP ARTHROPLASTY Left 12/24/2014   Procedure: LEFT TOTAL HIP ARTHROPLASTY ANTERIOR APPROACH;  Surgeon: Mauri Pole, MD;  Location: [facility];  Service: Orthopedics;  Laterality: Left;     OB History   No obstetric history on file.     Family History  Problem Relation Age of Onset  . Kidney disease Mother   . Asthma Mother   . Thyroid disease Mother   . Glaucoma Mother   . Diabetes Father   . Stroke Father   . Hypertension Father   . Kidney disease Father   . Hypertension Sister     Social History   Tobacco Use  . Smoking status: Former Smoker    Packs/day: 1.50    Years: 27.00    Pack years: 40.50    Types: Cigarettes  Quit date: 11/15/1999    Years since quitting: 20.2  . Smokeless tobacco: Never Used  Substance Use Topics  . Alcohol use: Yes    Comment: Seldom drinks, but occasional wine.  . Drug use: No    Home Medications Prior to Admission medications   Medication Sig Start Date End Date Taking? Authorizing Provider  albuterol (PROVENTIL HFA;VENTOLIN HFA) 108 (90 Base) MCG/ACT inhaler Inhale 2 puffs into the lungs every 6 (six) hours as needed for wheezing or shortness of breath. 11/20/18  Yes Martyn Ehrich, NP  ALPRAZolam Duanne Moron) 0.5 MG tablet Take 0.5 mg by mouth as needed for anxiety or sleep.    Yes [provider]  CARTIA XT 180 MG 24 hr capsule TAKE 1 CAPSULE BY MOUTH DAILY. Patient taking  differently: Take 180 mg by mouth daily.  07/23/19  Yes Belva Crome, MD  esomeprazole (NEXIUM) 40 MG capsule Take 40 mg by mouth 2 (two) times daily before a meal.  04/10/18  Yes [provider]  fluticasone (FLONASE) 50 MCG/ACT nasal spray Place 2 sprays into both nostrils daily as needed (For cold symptoms).   Yes [provider]  fluticasone furoate-vilanterol (BREO ELLIPTA) 200-25 MCG/INH AEPB Inhale 1 puff into the lungs daily as needed (For cold symptoms). 11/20/18  Yes Martyn Ehrich, NP  losartan (COZAAR) 100 MG tablet TAKE 1 TABLET (100 MG TOTAL) BY MOUTH DAILY. 07/23/19  Yes Belva Crome, MD  valACYclovir (VALTREX) 1000 MG tablet Take 1,000 mg by mouth as needed (Fever blisters.).  03/01/18  Yes [provider]  predniSONE (DELTASONE) 20 MG tablet Take 1 tablet (20 mg total) by mouth 2 (two) times daily. 02/12/20   Daleen Bo, MD    Allergies    Hctz [hydrochlorothiazide], Hydralazine hcl, Metoclopramide hcl, Other, Tramadol, Voltaren [diclofenac sodium], Pristiq [desvenlafaxine succinate er], Avelox [moxifloxacin hcl in nacl], Clindamycin, Clindamycin/lincomycin, Flagyl [metronidazole], Moxifloxacin, and Neosporin [neomycin-bacitracin zn-polymyx]  Review of Systems   Review of Systems  All other systems reviewed and are negative.   Physical Exam Updated Vital Signs BP 137/76   Pulse 63   Temp 98.1 F (36.7 C) (Oral)   Resp 14   Ht 5\' 6"  (1.676 m)   Wt 90.7 kg   SpO2 100%   BMI 32.28 kg/m   Physical Exam Vitals and nursing note reviewed.  Constitutional:      General: She is not in acute distress.    Appearance: She is well-developed. She is not ill-appearing, toxic-appearing or diaphoretic.  HENT:     Head: Normocephalic and atraumatic.  Eyes:     Conjunctiva/sclera: Conjunctivae normal.     Pupils: Pupils are equal, round, and reactive to light.  Neck:     Trachea: Phonation normal.  Cardiovascular:     Rate and Rhythm: Normal rate  and regular rhythm.  Pulmonary:     Effort: Pulmonary effort is normal. No respiratory distress.     Breath sounds: Normal breath sounds. No stridor.  Chest:     Chest wall: No tenderness.  Abdominal:     General: There is distension.     Palpations: Abdomen is soft. There is no mass.     Tenderness: There is abdominal tenderness (Diffuse, mild). There is no guarding.     Hernia: No hernia is present.  Musculoskeletal:        General: Normal range of motion.     Cervical back: Normal range of motion and neck supple.  Skin:  General: Skin is warm and dry.  Neurological:     Mental Status: She is alert and oriented to person, place, and time.     Motor: No abnormal muscle tone.  Psychiatric:        Mood and Affect: Mood normal.        Behavior: Behavior normal.        Thought Content: Thought content normal.        Judgment: Judgment normal.     ED Results / Procedures / Treatments   Labs (all labs ordered are listed, but only abnormal results are displayed) Labs Reviewed  COMPREHENSIVE METABOLIC PANEL - Abnormal; Notable for the following components:      Result Value   Glucose, Bld 133 (*)    All other components within normal limits  CBC - Abnormal; Notable for the following components:   WBC 13.5 (*)    All other components within normal limits  URINALYSIS, ROUTINE W REFLEX MICROSCOPIC - Abnormal; Notable for the following components:   Hgb urine dipstick MODERATE (*)    Ketones, ur 5 (*)    Bacteria, UA RARE (*)    All other components within normal limits  LIPASE, BLOOD    EKG None  Radiology CT Abdomen Pelvis W Contrast  Result Date: 02/12/2020 CLINICAL DATA:  Abdominal distension EXAM: CT ABDOMEN AND PELVIS WITH CONTRAST TECHNIQUE: Multidetector CT imaging of the abdomen and pelvis was performed using the standard protocol following bolus administration of intravenous contrast. CONTRAST:  156mL OMNIPAQUE IOHEXOL 300 MG/ML  SOLN COMPARISON:  12/28/2018 CT  FINDINGS: Lower chest: Lung bases demonstrate no acute consolidation or pleural effusion. Cardiac size is within normal limits. Hepatobiliary: Cyst in the right hepatic dome. Status post cholecystectomy. No biliary enlargement Pancreas: Unremarkable. No pancreatic ductal dilatation or surrounding inflammatory changes. Spleen: Normal in size without focal abnormality. Adrenals/Urinary Tract: Adrenal glands are unremarkable. Kidneys are normal, without renal calculi, focal lesion, or hydronephrosis. Bladder is unremarkable. Stomach/Bowel: Stomach nonenlarged. Borderline distended fluid-filled bowel within the anterior abdomen though without convincing evidence for obstruction. Small bowel inflammatory process involving mid to distal ileal loops in the lower abdomen and pelvis, for example series 3, image number 67, as evidence by bowel wall thickening, mucosal enhancement and mesenteric edema/vascular congestion of associated bowel loops. Terminal ileal bowel wall thickening and mucosal enhancement. Negative for colon wall thickening. Vascular/Lymphatic: Mild aortic atherosclerosis without aneurysm. No suspicious adenopathy Reproductive: Uterus unremarkable.  5 cm left adnexal cyst. Other: No free air. Small moderate free fluid in the pelvis. Small amount perihepatic ascites and fluid in the right gutter. Musculoskeletal: No acute or significant osseous findings. IMPRESSION: 1. Few borderline distended fluid-filled loops of small bowel though without definitive transition point to suggest an obstruction. Small bowel inflammatory process involving mid to distal ileal loops and terminal ileum as evidence by wall thickening, mucosal enhancement and mesenteric edema, findings could be due to infection, inflammatory bowel disease, or ischemia. 2. Small amount of abdominopelvic ascites, new since 12/28/2018. 3. 5 cm left adnexal cyst. Assuming late postmenopausal patient and size of lesion, consider correlation with pelvic  ultrasound which may be performed on a nonemergent basis unless otherwise clinically indicated. Electronically Signed   By: Donavan Foil M.D.   On: 02/12/2020 16:41    Procedures Procedures (including critical care time)  Medications Ordered in ED Medications  0.9 %  sodium chloride infusion (has no administration in time range)  predniSONE (DELTASONE) tablet 40 mg (has no administration in time range)  sodium chloride 0.9 % bolus 1,000 mL (1,000 mLs Intravenous New Bag/Given 02/12/20 1518)  iohexol (OMNIPAQUE) 300 MG/ML solution 100 mL (100 mLs Intravenous Contrast Given 02/12/20 1537)    ED Course  I have reviewed the triage vital signs and the nursing notes.  Pertinent labs & imaging results that were available during my care of the patient were reviewed by me and considered in my medical decision making (see chart for details).  Clinical Course as of Feb 11 1701  Wed Feb 12, 2020  1434 Normal except white count elevated  CBC(!) [EW]  1434 Normal except glucose high  Comprehensive metabolic panel(!) [EW]  99991111 Case discussed with Dr. Clarene Essex, he agrees with CT imaging, and based on the results, will give additional recommendations.  We discussed the noncompliance with mesalamine.  He can be reached at [phone], to help with disposition.   [EW]    Clinical Course User Index [EW] Daleen Bo, MD   MDM Rules/Calculators/A&P                       Patient Vitals for the past 24 hrs:  BP Temp Temp src Pulse Resp SpO2 Height Weight  02/12/20 1630 137/76 -- -- 63 14 100 % -- --  02/12/20 1500 116/71 -- -- 77 -- 98 % -- --  02/12/20 1430 120/80 -- -- 77 -- 100 % -- --  02/12/20 1415 121/75 -- -- 80 -- 99 % -- --  02/12/20 1414 116/68 98.1 F (36.7 C) Oral 77 16 100 % -- --  02/12/20 1345 (!) 129/93 -- -- 76 -- 99 % -- --  02/12/20 1330 130/82 -- -- 83 -- 99 % -- --  02/12/20 1100 (!) 144/90 98.2 F (36.8 C) Oral 100 17 100 % 5\' 6"  (1.676 m) 90.7 kg    4:47 PM  Reevaluation with update and discussion. After initial assessment and treatment, an updated evaluation reveals clinical exam unchanged, patient updated on findings.  Patient states that she has a known left fallopian cyst, which her gynecologist follows regularly.  She was informed that it is 5 cm, and the recommendation is to have it rechecked. Daleen Bo   Medical Decision Making: Patient with ileitis, using mesalamine, sporadically, and has progression of inflammatory bowel process.  CT also with fluid in the pelvis, and adnexal mass, which requires outpatient follow-up pelvic ultrasound.  CT also shows mild distended bowel loops, without significant signs for obstruction. Nicole [name] was evaluated in Emergency Department on 02/12/2020 for the symptoms described in the history of present illness. She was evaluated in the context of the global COVID-19 pandemic, which necessitated consideration that the patient might be at risk for infection with the SARS-CoV-2 virus that causes COVID-19. Institutional protocols and algorithms that pertain to the evaluation of patients at risk for COVID-19 are in a state of rapid change based on information released by regulatory bodies including the CDC and federal and state organizations. These policies and algorithms were followed during the patient's care in the ED.   CRITICAL CARE- No Performed by: Daleen Bo   Nursing Notes Reviewed/ Care Coordinated Applicable Imaging Reviewed Interpretation of Laboratory Data incorporated into ED treatment  The patient appears reasonably screened and/or stabilized for discharge and I doubt any other medical condition or other [facility] requiring further screening, evaluation, or treatment in the ED at this time prior to discharge.  Plan: Home Medications-continue usual prescribed medications include mesalamine, for each day; Home  Treatments-liquid diet until following up with GI next week; return here if the recommended  treatment, does not improve the symptoms; Recommended follow up-GI follow-up 1 week.    Final Clinical Impression(s) / ED Diagnoses Final diagnoses:  Ileitis  Pelvic mass    Rx / DC Orders ED Discharge Orders         Ordered    predniSONE (DELTASONE) 20 MG tablet  2 times daily     02/12/20 1701            Daleen Bo, MD 02/12/20 1702

## 2020-02-12 NOTE — ED Triage Notes (Signed)
Pt arrives POV for eval of abd pain N/V, wretching since yesterday evening. Pt reports her doctor told her she has "Crohn's, Colitis or IBS or something". States she also recently stopped taking " a medicine that begins with M" which she believes has contributed to current episode. Pt states that she "knew her doc was on call today, so she came in so that she wouldn't have a perforation". States last BM Monday, endorses ongoing abd discomfort.

## 2020-02-13 MED FILL — MESALAMINE ER 0.375 GM CP24: 0.375 | 30 days supply | Qty: 120 | Fill #0

## 2020-02-21 MED FILL — DILTIAZEM HCL ER COATED BEA: 180 | 30 days supply | Qty: 30 | Fill #4

## 2020-02-21 MED FILL — LOSARTAN POTASSIUM 100 MG T: 100 | 30 days supply | Qty: 30 | Fill #4

## 2020-03-17 ENCOUNTER — Other Ambulatory Visit (HOSPITAL_COMMUNITY): Payer: Self-pay | Admitting: Family Medicine

## 2020-03-17 MED FILL — LOSARTAN POTASSIUM 100 MG T: 100 | 30 days supply | Qty: 30 | Fill #5

## 2020-03-17 MED FILL — valACYclovir HCL 1 GM TABS: 1 | 22 days supply | Qty: 90 | Fill #0

## 2020-03-17 MED FILL — NYSTATIN-TRIAMCINOLONE OINT: 100000-0.1 | 15 days supply | Qty: 30 | Fill #1

## 2020-03-17 MED FILL — DILTIAZEM HCL ER COATED BEA: 180 | 30 days supply | Qty: 30 | Fill #5

## 2020-03-17 MED FILL — ALPRAZolam 0.5 MG TABS: 0.5 | 30 days supply | Qty: 90 | Fill #2

## 2020-04-16 MED FILL — CITALOPRAM HBR 20 MG TABLET: 20 | 30 days supply | Qty: 30 | Fill #0

## 2020-04-27 MED FILL — CITALOPRAM HBR 10 MG TABLET: 10 | 30 days supply | Qty: 15 | Fill #0

## 2020-04-29 MED FILL — MESALAMINE ER 0.375 GM CP24: 0.375 | 30 days supply | Qty: 120 | Fill #1

## 2020-05-11 MED FILL — ALPRAZolam 0.5 MG TABS: 0.5 | 30 days supply | Qty: 90 | Fill #3

## 2020-05-28 MED FILL — TRIAMCINOLONE ACETONIDE 0.1: 0.1 | 10 days supply | Qty: 80 | Fill #0

## 2020-06-05 MED FILL — MESALAMINE ER 0.375 GM CP24: 0.375 | 30 days supply | Qty: 120 | Fill #2

## 2020-06-05 MED FILL — AMOXICILLIN 500 MG CAPSULE: 500 | 5 days supply | Qty: 20 | Fill #0

## 2020-06-16 MED FILL — CLOBETASOL PROPIONATE 0.05: 0.05 | 30 days supply | Qty: 45 | Fill #0

## 2020-06-18 ENCOUNTER — Telehealth: Payer: Self-pay | Admitting: Interventional Cardiology

## 2020-06-18 ENCOUNTER — Other Ambulatory Visit: Payer: Self-pay | Admitting: Interventional Cardiology

## 2020-06-18 MED ORDER — LOSARTAN POTASSIUM 100 MG PO TABS: 100.0000 mg | ORAL_TABLET | Freq: Every day | ORAL | 0 refills | Status: DC

## 2020-06-18 MED FILL — LOSARTAN POTASSIUM 100 MG T: 100 | 30 days supply | Qty: 30 | Fill #0

## 2020-06-18 NOTE — Telephone Encounter (Signed)
Pt's medication was sent to pt's pharmacy as requested. Confirmation received.  °

## 2020-06-18 NOTE — Telephone Encounter (Signed)
*  STAT* If patient is at the pharmacy, call can be transferred to refill team.   1. Which medications need to be refilled? (please list name of each medication and dose if known) losartan (COZAAR) 100 MG tablet  2. Which pharmacy/location (including street and city if local pharmacy) is medication to be sent to? [place], [place]  3. Do they need a 30 day or 90 day supply? 90 day supply

## 2020-06-22 MED FILL — DILTIAZEM HCL ER COATED BEA: 180 | 30 days supply | Qty: 30 | Fill #0

## 2020-06-22 MED FILL — ALPRAZolam 0.5 MG TABS: 0.5 | 30 days supply | Qty: 90 | Fill #0

## 2020-06-29 ENCOUNTER — Telehealth: Payer: Self-pay | Admitting: Interventional Cardiology

## 2020-06-29 NOTE — Telephone Encounter (Signed)
New message:    Patient calling to ask if she can come in the same time her mother has a appt at 4 pm. Her appt is in the am and would like to make one trip. Please call patient.

## 2020-06-29 NOTE — Telephone Encounter (Signed)
Patient is scheduled to see Dr. Tamala Julian at [facility] and her mother is scheduled for 4pm that same day. The patient is wondering if her mom could have her appointment moved to an earlier time in the AM so they could come together and only make one trip. I advised that it looks to be easier to move her appointment to the afternoon. She states she would prefer earlier. I advised her that I would reach out to [facility] to see if she could work them in at closer times as I did not see any available closer together that I could use. Offered the patient two openings the following week for her an her mother that were together but patient would prefer to keep appointments on 10/08.

## 2020-06-30 NOTE — Telephone Encounter (Signed)
Spoke with pt and advised unable to adjust appts on that same day.  Moved her mother's appt up to 9/30.  Offered sooner but pt doesn't have her work schedule yet.  She will call back once she does.

## 2020-07-03 ENCOUNTER — Ambulatory Visit: Payer: Managed Care, Other (non HMO) | Admitting: Interventional Cardiology

## 2020-07-13 MED FILL — DILTIAZEM HCL ER COATED BEA: 180 | 30 days supply | Qty: 30 | Fill #1

## 2020-07-13 MED FILL — LOSARTAN POTASSIUM 100 MG T: 100 | 30 days supply | Qty: 30 | Fill #1

## 2020-08-04 DIAGNOSIS — K5090 Crohn's disease, unspecified, without complications: Secondary | ICD-10-CM | POA: Insufficient documentation

## 2020-08-12 ENCOUNTER — Other Ambulatory Visit: Payer: Self-pay | Admitting: Interventional Cardiology

## 2020-08-12 DIAGNOSIS — I10 Essential (primary) hypertension: Secondary | ICD-10-CM

## 2020-08-12 MED ORDER — DILTIAZEM HCL ER COATED BEADS 180 MG PO CP24: 180.0000 mg | ORAL_CAPSULE | Freq: Every day | ORAL | 0 refills | Status: DC

## 2020-08-13 ENCOUNTER — Other Ambulatory Visit (HOSPITAL_COMMUNITY): Payer: Self-pay | Admitting: Gastroenterology

## 2020-08-14 ENCOUNTER — Ambulatory Visit: Payer: Managed Care, Other (non HMO) | Admitting: Interventional Cardiology

## 2020-08-14 DIAGNOSIS — K5000 Crohn's disease of small intestine without complications: Secondary | ICD-10-CM | POA: Diagnosis not present

## 2020-08-14 DIAGNOSIS — K529 Noninfective gastroenteritis and colitis, unspecified: Secondary | ICD-10-CM | POA: Diagnosis not present

## 2020-08-14 DIAGNOSIS — R21 Rash and other nonspecific skin eruption: Secondary | ICD-10-CM | POA: Diagnosis not present

## 2020-08-14 MED FILL — LOSARTAN POTASSIUM 100 MG T: 100 | 30 days supply | Qty: 30 | Fill #2

## 2020-08-14 MED FILL — MESALAMINE ER 0.375 GM CP24: 0.375 | 30 days supply | Qty: 120 | Fill #0

## 2020-08-14 MED FILL — DILTIAZEM HCL ER COATED BEA: 180 | 30 days supply | Qty: 60 | Fill #0

## 2020-08-14 MED FILL — ALPRAZolam 0.5 MG TABS: 0.5 | 30 days supply | Qty: 90 | Fill #1

## 2020-08-14 MED FILL — valACYclovir HCL 1 GM TABS: 1 | 22 days supply | Qty: 90 | Fill #1

## 2020-08-21 ENCOUNTER — Ambulatory Visit: Payer: Managed Care, Other (non HMO) | Admitting: Interventional Cardiology

## 2020-08-24 ENCOUNTER — Other Ambulatory Visit (HOSPITAL_COMMUNITY): Payer: Self-pay | Admitting: Ophthalmology

## 2020-08-24 MED FILL — TOBRAMYCIN 0.3 % SOLN: 0.3 | 21 days supply | Qty: 5 | Fill #0

## 2020-08-25 DIAGNOSIS — K529 Noninfective gastroenteritis and colitis, unspecified: Secondary | ICD-10-CM | POA: Diagnosis not present

## 2020-08-25 MED FILL — DUREZOL 0.05% EYE DROPS: 0.05 | 30 days supply | Qty: 5 | Fill #0

## 2020-08-25 MED FILL — PROLENSA 0.07% EYE DROPS: 0.07 | 21 days supply | Qty: 3 | Fill #0

## 2020-09-11 DIAGNOSIS — Z23 Encounter for immunization: Secondary | ICD-10-CM | POA: Diagnosis not present

## 2020-09-14 ENCOUNTER — Other Ambulatory Visit: Payer: Self-pay | Admitting: Interventional Cardiology

## 2020-09-15 ENCOUNTER — Other Ambulatory Visit: Payer: Self-pay | Admitting: Interventional Cardiology

## 2020-09-15 ENCOUNTER — Other Ambulatory Visit: Payer: Self-pay

## 2020-09-15 DIAGNOSIS — I10 Essential (primary) hypertension: Secondary | ICD-10-CM

## 2020-09-15 MED ORDER — DILTIAZEM HCL ER COATED BEADS 180 MG PO CP24: 180.0000 mg | ORAL_CAPSULE | Freq: Every day | ORAL | 0 refills | Status: DC

## 2020-09-15 MED ORDER — LOSARTAN POTASSIUM 100 MG PO TABS: 100.0000 mg | ORAL_TABLET | Freq: Every day | ORAL | 0 refills | Status: DC

## 2020-09-15 MED FILL — LOSARTAN POTASSIUM 100 MG T: 100 | 90 days supply | Qty: 90 | Fill #0

## 2020-09-15 MED FILL — DILTIAZEM HCL ER COATED BEA: 180 | 90 days supply | Qty: 90 | Fill #0

## 2020-09-15 NOTE — Telephone Encounter (Signed)
Pt calling requesting a refill on losartan and diltiazem. Pt has an upcoming appt with Dr. Tamala Julian in January 2022, pt has also cancelled previous appts in the past. Pt is requesting a 90 day supply of these medications. Would Dr. Tamala Julian like to refill these medications for 90 day supply? Please address

## 2020-10-01 DIAGNOSIS — L723 Sebaceous cyst: Secondary | ICD-10-CM | POA: Diagnosis not present

## 2020-10-01 DIAGNOSIS — N390 Urinary tract infection, site not specified: Secondary | ICD-10-CM | POA: Diagnosis not present

## 2020-10-03 MED FILL — MESALAMINE ER 0.375 GM CP24: 0.375 | 30 days supply | Qty: 120 | Fill #1

## 2020-10-03 MED FILL — ALPRAZolam 0.5 MG TABS: 0.5 | 30 days supply | Qty: 90 | Fill #2

## 2020-10-14 DIAGNOSIS — L853 Xerosis cutis: Secondary | ICD-10-CM | POA: Diagnosis not present

## 2020-10-14 DIAGNOSIS — L820 Inflamed seborrheic keratosis: Secondary | ICD-10-CM | POA: Diagnosis not present

## 2020-10-14 DIAGNOSIS — D485 Neoplasm of uncertain behavior of skin: Secondary | ICD-10-CM | POA: Diagnosis not present

## 2020-10-16 ENCOUNTER — Other Ambulatory Visit: Payer: Self-pay | Admitting: Primary Care

## 2020-10-16 DIAGNOSIS — N952 Postmenopausal atrophic vaginitis: Secondary | ICD-10-CM | POA: Diagnosis not present

## 2020-10-16 DIAGNOSIS — N393 Stress incontinence (female) (male): Secondary | ICD-10-CM | POA: Diagnosis not present

## 2020-10-16 DIAGNOSIS — L900 Lichen sclerosus et atrophicus: Secondary | ICD-10-CM | POA: Diagnosis not present

## 2020-10-16 DIAGNOSIS — R350 Frequency of micturition: Secondary | ICD-10-CM | POA: Diagnosis not present

## 2020-10-16 MED FILL — BREO ELLIPTA 200-25 MCG INH: 200-25 | 30 days supply | Qty: 60 | Fill #0

## 2020-10-16 MED FILL — ALBUTEROL SULFATE HFA 108 (: 108 (90 BAS | 25 days supply | Qty: 9 | Fill #0

## 2020-10-19 ENCOUNTER — Ambulatory Visit: Payer: Managed Care, Other (non HMO) | Admitting: Cardiology

## 2020-10-19 DIAGNOSIS — Z961 Presence of intraocular lens: Secondary | ICD-10-CM | POA: Diagnosis not present

## 2020-10-19 DIAGNOSIS — H524 Presbyopia: Secondary | ICD-10-CM | POA: Diagnosis not present

## 2020-10-19 DIAGNOSIS — H5213 Myopia, bilateral: Secondary | ICD-10-CM | POA: Diagnosis not present

## 2020-10-19 DIAGNOSIS — H2511 Age-related nuclear cataract, right eye: Secondary | ICD-10-CM | POA: Diagnosis not present

## 2020-10-19 DIAGNOSIS — Z9841 Cataract extraction status, right eye: Secondary | ICD-10-CM | POA: Diagnosis not present

## 2020-10-19 DIAGNOSIS — H52221 Regular astigmatism, right eye: Secondary | ICD-10-CM | POA: Diagnosis not present

## 2020-10-20 ENCOUNTER — Other Ambulatory Visit (HOSPITAL_COMMUNITY): Payer: Self-pay | Admitting: Ophthalmology

## 2020-10-20 DIAGNOSIS — H2512 Age-related nuclear cataract, left eye: Secondary | ICD-10-CM | POA: Diagnosis not present

## 2020-10-20 MED FILL — DUREZOL 0.05% EYE DROPS: 0.05 | 30 days supply | Qty: 5 | Fill #0

## 2020-10-20 MED FILL — PROLENSA 0.07% EYE DROPS: 0.07 | 60 days supply | Qty: 3 | Fill #0

## 2020-10-20 MED FILL — TOBRAMYCIN 0.3 % SOLN: 0.3 | 21 days supply | Qty: 5 | Fill #0

## 2020-11-02 DIAGNOSIS — Z961 Presence of intraocular lens: Secondary | ICD-10-CM | POA: Diagnosis not present

## 2020-11-02 DIAGNOSIS — Z9841 Cataract extraction status, right eye: Secondary | ICD-10-CM | POA: Diagnosis not present

## 2020-11-02 DIAGNOSIS — H2511 Age-related nuclear cataract, right eye: Secondary | ICD-10-CM | POA: Diagnosis not present

## 2020-11-02 DIAGNOSIS — Z9842 Cataract extraction status, left eye: Secondary | ICD-10-CM | POA: Diagnosis not present

## 2020-11-02 DIAGNOSIS — H2512 Age-related nuclear cataract, left eye: Secondary | ICD-10-CM | POA: Diagnosis not present

## 2020-11-23 ENCOUNTER — Telehealth: Payer: Self-pay | Admitting: Emergency Medicine

## 2020-11-23 ENCOUNTER — Other Ambulatory Visit: Payer: Self-pay | Admitting: Emergency Medicine

## 2020-11-23 MED ORDER — PREDNISONE 10 MG PO TABS: ORAL_TABLET | ORAL | 0 refills | Status: DC

## 2020-11-23 MED FILL — predniSONE 10 MG TABS: 10 | 12 days supply | Qty: 30 | Fill #0

## 2020-11-23 NOTE — Telephone Encounter (Signed)
Spoke with the pt and notified of response from Dr Lamonte Sakai. OV scheduled for 12/14/20 and pred taper sent to her preferred pharmacy.

## 2020-11-23 NOTE — Telephone Encounter (Signed)
ATC Patient.  LM to call back. Prednisone taper pending.

## 2020-11-23 NOTE — Telephone Encounter (Signed)
Please ask her to take pred taper as below. She needs to set up an OV with me since we have not seen each other ion several years. We can assess her improvement with the pred at that time.   Pred >> Take 40mg  daily for 3 days, then 30mg  daily for 3 days, then 20mg  daily for 3 days, then 10mg  daily for 3 days, then stop

## 2020-11-23 NOTE — Telephone Encounter (Signed)
Spoke with the pt  She is c/o cough x 1 month, prod with thick, white sputum She states that she feels as if she can not take a good, deep breath  She is not wheezing or having any chest tightness  She denies f/c/s, aches  Still taking the Breo and using her albuterol inhaler  She states sometimes uses the Breo bid and I advised not to do this since it is prescribed qd  She has had covid vaccines  Please advise, thanks!

## 2020-12-03 ENCOUNTER — Other Ambulatory Visit (HOSPITAL_COMMUNITY): Payer: Self-pay | Admitting: Specialist

## 2020-12-03 MED FILL — ALPRAZolam 0.5 MG TABS: 0.5 | 30 days supply | Qty: 21 | Fill #0

## 2020-12-08 NOTE — Progress Notes (Signed)
Cardiology Office Note:    Date:  12/10/2020   ID:  Nicole [name], DOB 02/26/1956, MRN [id number]  PCP:  Gaynelle Arabian, MD  Cardiologist:  Sinclair Grooms, MD   Referring MD: Gaynelle Arabian, MD   Chief Complaint  Patient presents with  . Hypertension  . Hyperlipidemia    History of Present Illness:    Nicole [name] is a 65 y.o. female with a hx of hypertension, and asthma.  She is stressed.  Her mother died of acute leukemia in 10-29-23 at age 68.  She is still clearly perturbed and becomes tearful when she talks for my.  She is still working three 12-hour shifts at [employer].  She has exposure to Covid patients.  She has no physical limitations.  She denies chest pain, orthopnea, and PND.  She does have a continuous feeling of tightness in her chest.  It is not worsened by physical activity.  It is not position related.  She is able to lie flat without shortness of breath.  She feels as though she cannot take a deep breath.  She also has some shoulder and back spasms that are position sensitive.  She will be seeing Dr. Lamonte Sakai next week.  Past Medical History:  Diagnosis Date  . Anxiety   . Arthritis    lumbar- HNP, /w myelopathy  . Asthma    /w reflux , consult /w Dr. Wert-2013 / USES INHALER PRN INFREQUENTLY  . Bronchitis    used albuterol , Sept. 2013, all clear now   . Complication of anesthesia   . Difficulty sleeping    DUE TO PAIN  . Family history of anesthesia complication    N&V- Mother   . GERD (gastroesophageal reflux disease)    Chron's disease, not medically treating currently   . H/O hiatal hernia    small hiatal hernia, has had endoscopy & colondoscopy  . H/O vertigo   . Heart murmur    since birth  . Hypertension    followed by Dr. Marisue Humble   . Numbness in left leg   . PONV (postoperative nausea and vomiting)   . Stress incontinence     Past Surgical History:  Procedure Laterality Date  . BACK SURGERY  2013  . CHOLECYSTECTOMY  03    laparoscopic  . LUMBAR LAMINECTOMY/DECOMPRESSION MICRODISCECTOMY  09/10/2012   Procedure: LUMBAR LAMINECTOMY/DECOMPRESSION MICRODISCECTOMY 1 LEVEL;  Surgeon: Kristeen Miss, MD;  Location: [facility];  Service: Neurosurgery;  Laterality: Left;  Left Lumbar Five-Sacral One Microdiscectomy  . TOTAL HIP ARTHROPLASTY Left 12/24/2014   Procedure: LEFT TOTAL HIP ARTHROPLASTY ANTERIOR APPROACH;  Surgeon: Mauri Pole, MD;  Location: [facility];  Service: Orthopedics;  Laterality: Left;    Current Medications: Current Meds  Medication Sig  . ALPRAZolam (XANAX) 0.5 MG tablet Take 0.5 mg by mouth as needed for anxiety or sleep.   Marland Kitchen BREO ELLIPTA 200-25 MCG/INH AEPB INHALE 1 PUFF INTO THE LUNGS DAILY AS NEEDED (FOR COLD SYMPTOMS).  Marland Kitchen esomeprazole (NEXIUM) 40 MG capsule Take 40 mg by mouth 2 (two) times daily before a meal.   . fluticasone (FLONASE) 50 MCG/ACT nasal spray Place 2 sprays into both nostrils daily as needed (For cold symptoms).  Marland Kitchen PROAIR HFA 108 (90 Base) MCG/ACT inhaler INHALE 2 PUFFS INTO THE LUNGS EVERY 6 (SIX) HOURS AS NEEDED FOR WHEEZING OR SHORTNESS OF BREATH.  . valACYclovir (VALTREX) 1000 MG tablet Take 1,000 mg by mouth as needed (Fever blisters.).   . [  DISCONTINUED] diltiazem (CARTIA XT) 180 MG 24 hr capsule Take 1 capsule (180 mg total) by mouth daily. You will need to keep January appointment with Dr. Tamala Julian to continue getting your medications.  . [DISCONTINUED] losartan (COZAAR) 100 MG tablet Take 1 tablet (100 mg total) by mouth daily. You will need to keep January appointment with Dr. Tamala Julian to continue getting your medications.     Allergies:   Hctz [hydrochlorothiazide], Hydralazine hcl, Metoclopramide hcl, Other, Tramadol, Voltaren [diclofenac sodium], Pristiq [desvenlafaxine succinate er], Avelox [moxifloxacin hcl in nacl], Clindamycin, Clindamycin/lincomycin, Flagyl [metronidazole], Moxifloxacin, and Neosporin [neomycin-bacitracin zn-polymyx]   Social History    Socioeconomic History  . Marital status: Divorced    Spouse name: Not on file  . Number of children: 0  . Years of education: 70  . Highest education level: Not on file  Occupational History  . Occupation: Therapist, sports  Tobacco Use  . Smoking status: Former Smoker    Packs/day: 1.50    Years: 27.00    Pack years: 40.50    Types: Cigarettes    Quit date: 11/15/1999    Years since quitting: 21.0  . Smokeless tobacco: Never Used  Vaping Use  . Vaping Use: Never used  Substance and Sexual Activity  . Alcohol use: Yes    Comment: Seldom drinks, but occasional wine.  . Drug use: No  . Sexual activity: Not on file  Other Topics Concern  . Not on file  Social History Narrative   Lives with mom and dad in a one story home.  Has no children.  Works as a travel Marine scientist.  Education: college.   Social Determinants of Health   Financial Resource Strain: Not on file  Food Insecurity: Not on file  Transportation Needs: Not on file  Physical Activity: Not on file  Stress: Not on file  Social Connections: Not on file     Family History: The patient's family history includes Asthma in her mother; Diabetes in her father; Glaucoma in her mother; Hypertension in her father and sister; Kidney disease in her father and mother; Stroke in her father; Thyroid disease in her mother.  ROS:   Please see the history of present illness.    Is a relatively recent diagnosis of Crohn's disease.  She has had partial small bowel obstruction treated with prednisone and IV fluids.  She has been having some bloating recently.  She is hoping to avoid Humira therapy because she is concerned about low immunity.  She sees Dr. Watt Climes.  All other systems reviewed and are negative.  EKGs/Labs/Other Studies Reviewed:    The following studies were reviewed today: No recent cardiac data  EKG:  EKG sinus rhythm.  Normal PR interval.  Normal EKG.  Recent Labs: 02/12/2020: ALT 15; BUN 9; Creatinine, Ser 0.82; Hemoglobin 13.9;  Platelets 289; Potassium 4.0; Sodium 138  Recent Lipid Panel No results found for: CHOL, TRIG, HDL, CHOLHDL, VLDL, LDLCALC, LDLDIRECT  Physical Exam:    VS:  BP 108/72   Pulse 64   Ht 5\' 6"  (1.676 m)   Wt 188 lb (85.3 kg)   BMI 30.34 kg/m     Wt Readings from Last 3 Encounters:  12/10/20 188 lb (85.3 kg)  02/12/20 200 lb (90.7 kg)  04/22/19 194 lb (88 kg)     GEN: Overweight. No acute distress HEENT: Normal NECK: No JVD. LYMPHATICS: No lymphadenopathy CARDIAC: No murmur. RRR no gallop, or edema. VASCULAR:  Normal Pulses. No bruits. RESPIRATORY:  Clear to auscultation without  rales, wheezing or rhonchi  ABDOMEN: Soft, non-tender, non-distended, No pulsatile mass, MUSCULOSKELETAL: No deformity  SKIN: Warm and dry NEUROLOGIC:  Alert and oriented x 3 PSYCHIATRIC:  Normal affect   ASSESSMENT:    1. Essential hypertension   2. Mild persistent asthma with acute exacerbation   3. Crohn's disease of small intestine without complication ([place])   4. Educated about COVID-19 virus infection   5. Reactive depression    PLAN:    In order of problems listed above:  1. The blood pressure is under excellent control on diltiazem 180 mg/day and Cozaar 100 mg/day.  Recent creatinine 1.82 and potassium 4.0. 2. States that she has pulmonary issues being managed by Dr. Lamonte Sakai 3. Had an abdominal x-ray done this morning and will have follow-up with Dr. May guide. 4. She is vaccinated, boosted, and practicing social distancing. 5. This is related to recent death of her mother.  Will be seeing Dr. Toy Care later today.   Medication Adjustments/Labs and Tests Ordered: Current medicines are reviewed at length with the patient today.  Concerns regarding medicines are outlined above.  Orders Placed This Encounter  Procedures  . EKG 12-Lead   Meds ordered this encounter  Medications  . diltiazem (CARTIA XT) 180 MG 24 hr capsule    Sig: Take 1 capsule (180 mg total) by mouth daily.    Dispense:   90 capsule    Refill:  3  . losartan (COZAAR) 100 MG tablet    Sig: Take 1 tablet (100 mg total) by mouth daily.    Dispense:  90 tablet    Refill:  3    Patient Instructions  Medication Instructions:  Your physician recommends that you continue on your current medications as directed. Please refer to the Current Medication list given to you today.  *If you need a refill on your cardiac medications before your next appointment, please call your pharmacy*   Lab Work: None If you have labs (blood work) drawn today and your tests are completely normal, you will receive your results only by: Marland Kitchen MyChart Message (if you have MyChart) OR . A paper copy in the mail If you have any lab test that is abnormal or we need to change your treatment, we will call you to review the results.   Testing/Procedures: None   Follow-Up: At [facility], you and your health needs are our priority.  As part of our continuing mission to provide you with exceptional heart care, we have created designated Provider Care Teams.  These Care Teams include your primary Cardiologist (physician) and Advanced Practice Providers (APPs -  Physician Assistants and Nurse Practitioners) who all work together to provide you with the care you need, when you need it.  We recommend signing up for the patient portal called "MyChart".  Sign up information is provided on this After Visit Summary.  MyChart is used to connect with patients for Virtual Visits (Telemedicine).  Patients are able to view lab/test results, encounter notes, upcoming appointments, etc.  Non-urgent messages can be sent to your provider as well.   To learn more about what you can do with MyChart, go to [email or web address].    Your next appointment:   1 year(s)  The format for your next appointment:   In Person  Provider:   You may see Sinclair Grooms, MD or one of the following Advanced Practice Providers on your designated Care Team:    Kathyrn Drown, NP    Other Instructions  Signed, Sinclair Grooms, MD  12/10/2020 9:50 AM    [place]

## 2020-12-10 ENCOUNTER — Other Ambulatory Visit (HOSPITAL_COMMUNITY): Payer: Self-pay | Admitting: Family Medicine

## 2020-12-10 ENCOUNTER — Other Ambulatory Visit: Payer: Self-pay

## 2020-12-10 ENCOUNTER — Other Ambulatory Visit: Payer: Self-pay | Admitting: Gastroenterology

## 2020-12-10 ENCOUNTER — Ambulatory Visit (INDEPENDENT_AMBULATORY_CARE_PROVIDER_SITE_OTHER): Payer: PPO | Admitting: Interventional Cardiology

## 2020-12-10 ENCOUNTER — Ambulatory Visit
Admission: RE | Admit: 2020-12-10 | Discharge: 2020-12-10 | Disposition: A | Discharge status: Home / Self Care | Payer: PPO | Source: Ambulatory Visit | Attending: Gastroenterology | Admitting: Gastroenterology

## 2020-12-10 ENCOUNTER — Encounter: Payer: Self-pay | Admitting: Interventional Cardiology

## 2020-12-10 VITALS — BP 108/72 | HR 64 | Ht 66.0 in | Wt 188.0 lb

## 2020-12-10 DIAGNOSIS — K5000 Crohn's disease of small intestine without complications: Secondary | ICD-10-CM | POA: Diagnosis not present

## 2020-12-10 DIAGNOSIS — J4531 Mild persistent asthma with (acute) exacerbation: Secondary | ICD-10-CM | POA: Diagnosis not present

## 2020-12-10 DIAGNOSIS — B009 Herpesviral infection, unspecified: Secondary | ICD-10-CM | POA: Diagnosis not present

## 2020-12-10 DIAGNOSIS — K2100 Gastro-esophageal reflux disease with esophagitis, without bleeding: Secondary | ICD-10-CM | POA: Diagnosis not present

## 2020-12-10 DIAGNOSIS — I10 Essential (primary) hypertension: Secondary | ICD-10-CM | POA: Diagnosis not present

## 2020-12-10 DIAGNOSIS — R109 Unspecified abdominal pain: Secondary | ICD-10-CM | POA: Diagnosis not present

## 2020-12-10 DIAGNOSIS — K5900 Constipation, unspecified: Secondary | ICD-10-CM

## 2020-12-10 DIAGNOSIS — F329 Major depressive disorder, single episode, unspecified: Secondary | ICD-10-CM

## 2020-12-10 DIAGNOSIS — Z7189 Other specified counseling: Secondary | ICD-10-CM

## 2020-12-10 DIAGNOSIS — E7800 Pure hypercholesterolemia, unspecified: Secondary | ICD-10-CM | POA: Diagnosis not present

## 2020-12-10 MED ORDER — LOSARTAN POTASSIUM 100 MG PO TABS: 100.0000 mg | ORAL_TABLET | Freq: Every day | ORAL | 3 refills | Status: DC

## 2020-12-10 MED ORDER — DILTIAZEM HCL ER COATED BEADS 180 MG PO CP24: 180.0000 mg | ORAL_CAPSULE | Freq: Every day | ORAL | 3 refills | Status: DC

## 2020-12-10 MED FILL — DILTIAZEM HCL ER COATED BEA: 180 | 90 days supply | Qty: 90 | Fill #0

## 2020-12-10 MED FILL — CITALOPRAM HBR 10 MG TABLET: 10 | 90 days supply | Qty: 90 | Fill #0

## 2020-12-10 MED FILL — valACYclovir HCL 1 GM TABS: 1 | 22 days supply | Qty: 90 | Fill #0

## 2020-12-10 MED FILL — ALPRAZolam 0.5 MG TABS: 0.5 | 30 days supply | Qty: 90 | Fill #0

## 2020-12-10 MED FILL — LOSARTAN POTASSIUM 100 MG T: 100 | 90 days supply | Qty: 90 | Fill #0

## 2020-12-10 NOTE — Patient Instructions (Signed)

## 2020-12-14 ENCOUNTER — Encounter: Payer: Self-pay | Admitting: Emergency Medicine

## 2020-12-14 ENCOUNTER — Ambulatory Visit (INDEPENDENT_AMBULATORY_CARE_PROVIDER_SITE_OTHER): Payer: PPO | Admitting: Emergency Medicine

## 2020-12-14 ENCOUNTER — Other Ambulatory Visit: Payer: Self-pay

## 2020-12-14 DIAGNOSIS — R0982 Postnasal drip: Secondary | ICD-10-CM | POA: Diagnosis not present

## 2020-12-14 DIAGNOSIS — R059 Cough, unspecified: Secondary | ICD-10-CM

## 2020-12-14 DIAGNOSIS — K5900 Constipation, unspecified: Secondary | ICD-10-CM | POA: Diagnosis not present

## 2020-12-14 DIAGNOSIS — J45909 Unspecified asthma, uncomplicated: Secondary | ICD-10-CM | POA: Diagnosis not present

## 2020-12-14 DIAGNOSIS — K5000 Crohn's disease of small intestine without complications: Secondary | ICD-10-CM | POA: Diagnosis not present

## 2020-12-14 NOTE — Assessment & Plan Note (Signed)
She needs repeat pulmonary function testing to assess her degree of obstruction.  She was only intermittently using Breo, states that her breathing was fine until she began to have flaring in December with dyspnea and cough.  She can continue use albuterol as needed I will restart the [facility] for now.  Assess her pulmonary function testing and decide which maintenance bronchodilator/ICS regimen to initiate

## 2020-12-14 NOTE — Assessment & Plan Note (Signed)
Continue nasal rinses.  Restart fluticasone nasal spray on a schedule.

## 2020-12-14 NOTE — Assessment & Plan Note (Signed)
Persistent cough with whitish mucus in the morning.  Clears this and then is for the most part dry cough for the rest of the day.  Sometimes has chest tightness that does respond albuterol.  Multifactorial.  Suspect that most of this is upper airway in nature.  Interestingly it did not respond to prednisone even temporarily.  I think we need to aggressively treat rhinitis, aggressively treat GERD (she is already on Nexium).

## 2020-12-14 NOTE — Addendum Note (Signed)
Addended by: Gavin Potters R on: 12/14/2020 04:18 PM   Modules accepted: Orders

## 2020-12-14 NOTE — Progress Notes (Signed)
Subjective:    Patient ID: Unknown Foley, female    DOB: 01-14-1956, 65 y.o.   MRN: [id number]  Asthma She complains of cough and wheezing. There is no shortness of breath. Pertinent negatives include no ear pain, fever, headaches, postnasal drip, rhinorrhea, sneezing, sore throat or trouble swallowing. Her past medical history is significant for asthma.   ROV 12/14/20 --65 year old woman former smoker (40 pack years) whom I have seen in the past for COPD with a borderline bronchodilator response, allergic rhinitis, GERD, upper airway instability with associated cough.  Last seen by me in 2017, last seen in our office 11/20/2018.  She called 11/23/2020 with 1 month of increased cough, whitish mucus and associated shortness of breath.  No wheezing, no viral prodrome.  I treated her with a prednisone taper.  She has been on Breo but only uses it in spots - about 1-2x a week. She has albuterol which she uses about once a day - does help her breathing some. She doesn't believe that the prednisone changed her symptoms at all. She has to clear mucous most mornings, feels some chest tightness in the afternoons that will respond to albuterol.  Nexium 40mg  bid, but still has some breakthrough GERD sx depending on what she eats.  She has nasal gtt, uses nasal saline daily, uses flonase  MDM: Reviewed notes from 11/20/2018, 03/29/2017, 02/22/2017, 07/25/2016   Review of Systems  Constitutional: Negative for fever and unexpected weight change.  HENT: Negative for congestion, dental problem, ear pain, nosebleeds, postnasal drip, rhinorrhea, sinus pressure, sneezing, sore throat and trouble swallowing.   Eyes: Negative for redness and itching.  Respiratory: Positive for cough and wheezing. Negative for chest tightness and shortness of breath.   Cardiovascular: Negative for palpitations and leg swelling.  Gastrointestinal: Negative for nausea and vomiting.  Genitourinary: Negative for dysuria.  Musculoskeletal:  Negative for joint swelling.  Skin: Negative for rash.  Neurological: Negative for headaches.  Hematological: Does not bruise/bleed easily.  Psychiatric/Behavioral: Negative for dysphoric mood. The patient is not nervous/anxious.        Objective:   Physical Exam Vitals:   12/14/20 1544  BP: 132/74  Pulse: 80  Temp: 98 F (36.7 C)  SpO2: 98%  Weight: 187 lb (84.8 kg)  Height: 5\' 6"  (1.676 m)   Gen: Pleasant, well-nourished, in no distress,  normal affect  ENT: No lesions,  mouth clear,  oropharynx clear, no postnasal drip  Neck: No JVD, no stridor  Lungs: No use of accessory muscles, clear without rales or rhonchi  Cardiovascular: RRR, heart sounds normal, no murmur or gallops, no peripheral edema  Musculoskeletal: No deformities, no cyanosis or clubbing  Neuro: alert, non focal  Skin: Warm, no lesions or rashes      Assessment & Plan:  Cough Persistent cough with whitish mucus in the morning.  Clears this and then is for the most part dry cough for the rest of the day.  Sometimes has chest tightness that does respond albuterol.  Multifactorial.  Suspect that most of this is upper airway in nature.  Interestingly it did not respond to prednisone even temporarily.  I think we need to aggressively treat rhinitis, aggressively treat GERD (she is already on Nexium).  Intrinsic asthma She needs repeat pulmonary function testing to assess her degree of obstruction.  She was only intermittently using Breo, states that her breathing was fine until she began to have flaring in December with dyspnea and cough.  She can continue use  albuterol as needed I will restart the [facility] for now.  Assess her pulmonary function testing and decide which maintenance bronchodilator/ICS regimen to initiate  Post-nasal drip Continue nasal rinses.  Restart fluticasone nasal spray on a schedule.  Baltazar Apo, MD, PhD 12/14/2020, 4:06 PM [facility] [phone] or if no answer  [phone]

## 2020-12-14 NOTE — Patient Instructions (Signed)
Please continue nasal saline rinses.  You can do these twice a day if you feel like this would help you clear your sinuses. Start using your fluticasone nasal spray, 2 sprays each nostril twice a day for 5 days, then decrease to once daily. Continue your Nexium 40 mg twice a day.  Work hard to avoid eating foods that will flare your esophageal reflux. Do not restart Breo for now. Keep albuterol available to use 2 puffs when needed for shortness of breath, chest tightness, wheezing. We will repeat your pulmonary function testing in next office visit. Follow with Dr. Lamonte Sakai next available with full pulmonary function testing on the same day.

## 2020-12-26 MED FILL — MESALAMINE ER 0.375 GM CP24: 0.375 | 30 days supply | Qty: 120 | Fill #2

## 2021-02-11 ENCOUNTER — Other Ambulatory Visit (HOSPITAL_COMMUNITY): Payer: Self-pay

## 2021-02-14 MED FILL — Mesalamine Cap ER 24HR 0.375 GM: ORAL | 30 days supply | Qty: 120 | Fill #0 | Status: AC

## 2021-02-14 MED FILL — Alprazolam Tab 0.5 MG: ORAL | 30 days supply | Qty: 90 | Fill #0 | Status: AC

## 2021-02-15 ENCOUNTER — Other Ambulatory Visit (HOSPITAL_COMMUNITY): Payer: Self-pay

## 2021-02-23 ENCOUNTER — Other Ambulatory Visit (HOSPITAL_COMMUNITY): Payer: Self-pay

## 2021-03-16 MED FILL — Losartan Potassium Tab 100 MG: ORAL | 90 days supply | Qty: 90 | Fill #0 | Status: AC

## 2021-03-17 ENCOUNTER — Other Ambulatory Visit (HOSPITAL_COMMUNITY): Payer: Self-pay

## 2021-03-19 ENCOUNTER — Other Ambulatory Visit (HOSPITAL_COMMUNITY): Payer: Self-pay

## 2021-03-25 ENCOUNTER — Other Ambulatory Visit (HOSPITAL_COMMUNITY): Payer: Self-pay

## 2021-03-30 ENCOUNTER — Other Ambulatory Visit (HOSPITAL_COMMUNITY): Payer: Self-pay

## 2021-03-30 MED FILL — Diltiazem HCl Coated Beads Cap ER 24HR 180 MG: ORAL | 90 days supply | Qty: 90 | Fill #0 | Status: AC

## 2021-03-31 ENCOUNTER — Other Ambulatory Visit (HOSPITAL_COMMUNITY): Payer: Self-pay

## 2021-03-31 MED ORDER — ALPRAZOLAM 0.5 MG PO TABS
ORAL_TABLET | ORAL | 0 refills | Status: DC
  Filled 2021-03-31: qty 90, 30d supply, fill #0

## 2021-04-01 ENCOUNTER — Other Ambulatory Visit (HOSPITAL_COMMUNITY): Payer: Self-pay

## 2021-04-16 ENCOUNTER — Other Ambulatory Visit (HOSPITAL_COMMUNITY): Payer: Self-pay

## 2021-04-16 MED ORDER — ALPRAZOLAM 0.5 MG PO TABS
ORAL_TABLET | ORAL | 0 refills | Status: DC
  Filled 2021-05-22: qty 270, 90d supply, fill #0

## 2021-04-16 MED ORDER — ESCITALOPRAM OXALATE 10 MG PO TABS
ORAL_TABLET | ORAL | 4 refills | Status: DC
  Filled 2021-04-16: qty 90, 90d supply, fill #0

## 2021-04-26 ENCOUNTER — Other Ambulatory Visit (HOSPITAL_COMMUNITY): Payer: Self-pay

## 2021-04-26 DIAGNOSIS — K5000 Crohn's disease of small intestine without complications: Secondary | ICD-10-CM | POA: Diagnosis not present

## 2021-04-26 DIAGNOSIS — K5900 Constipation, unspecified: Secondary | ICD-10-CM | POA: Diagnosis not present

## 2021-04-26 DIAGNOSIS — K2100 Gastro-esophageal reflux disease with esophagitis, without bleeding: Secondary | ICD-10-CM | POA: Diagnosis not present

## 2021-04-26 DIAGNOSIS — R1013 Epigastric pain: Secondary | ICD-10-CM | POA: Diagnosis not present

## 2021-04-26 MED ORDER — SUCRALFATE 1 G PO TABS
ORAL_TABLET | ORAL | 2 refills | Status: DC
  Filled 2021-04-26: qty 90, 30d supply, fill #0
  Filled 2021-07-26: qty 90, 30d supply, fill #1

## 2021-04-26 MED ORDER — MESALAMINE ER 0.375 G PO CP24
ORAL_CAPSULE | ORAL | 5 refills | Status: DC
  Filled 2021-04-26: qty 120, 30d supply, fill #0
  Filled 2021-06-14: qty 120, 30d supply, fill #1
  Filled 2021-07-26: qty 120, 30d supply, fill #2
  Filled 2021-09-01: qty 120, 30d supply, fill #3
  Filled 2021-10-26: qty 120, 30d supply, fill #4
  Filled 2021-12-31: qty 120, 30d supply, fill #5

## 2021-04-27 ENCOUNTER — Other Ambulatory Visit (HOSPITAL_COMMUNITY): Payer: Self-pay

## 2021-05-03 DIAGNOSIS — F331 Major depressive disorder, recurrent, moderate: Secondary | ICD-10-CM | POA: Diagnosis not present

## 2021-05-14 ENCOUNTER — Other Ambulatory Visit (HOSPITAL_COMMUNITY): Payer: Self-pay

## 2021-05-14 DIAGNOSIS — L0390 Cellulitis, unspecified: Secondary | ICD-10-CM | POA: Diagnosis not present

## 2021-05-14 DIAGNOSIS — Z23 Encounter for immunization: Secondary | ICD-10-CM | POA: Diagnosis not present

## 2021-05-14 DIAGNOSIS — S40861A Insect bite (nonvenomous) of right upper arm, initial encounter: Secondary | ICD-10-CM | POA: Diagnosis not present

## 2021-05-14 DIAGNOSIS — W57XXXA Bitten or stung by nonvenomous insect and other nonvenomous arthropods, initial encounter: Secondary | ICD-10-CM | POA: Diagnosis not present

## 2021-05-14 MED ORDER — CEPHALEXIN 500 MG PO CAPS
500.0000 mg | ORAL_CAPSULE | Freq: Three times a day (TID) | ORAL | 0 refills | Status: DC
  Filled 2021-05-14: qty 15, 5d supply, fill #0

## 2021-05-22 ENCOUNTER — Other Ambulatory Visit (HOSPITAL_COMMUNITY): Payer: Self-pay

## 2021-05-28 DIAGNOSIS — M25562 Pain in left knee: Secondary | ICD-10-CM | POA: Diagnosis not present

## 2021-05-28 DIAGNOSIS — M25561 Pain in right knee: Secondary | ICD-10-CM | POA: Diagnosis not present

## 2021-05-31 ENCOUNTER — Other Ambulatory Visit: Payer: Self-pay

## 2021-05-31 ENCOUNTER — Ambulatory Visit (INDEPENDENT_AMBULATORY_CARE_PROVIDER_SITE_OTHER): Payer: PPO | Admitting: Emergency Medicine

## 2021-05-31 ENCOUNTER — Encounter: Payer: Self-pay | Admitting: Primary Care

## 2021-05-31 ENCOUNTER — Ambulatory Visit: Payer: PPO | Admitting: Primary Care

## 2021-05-31 DIAGNOSIS — R059 Cough, unspecified: Secondary | ICD-10-CM | POA: Diagnosis not present

## 2021-05-31 DIAGNOSIS — L570 Actinic keratosis: Secondary | ICD-10-CM | POA: Diagnosis not present

## 2021-05-31 DIAGNOSIS — J45909 Unspecified asthma, uncomplicated: Secondary | ICD-10-CM

## 2021-05-31 DIAGNOSIS — L565 Disseminated superficial actinic porokeratosis (DSAP): Secondary | ICD-10-CM | POA: Diagnosis not present

## 2021-05-31 DIAGNOSIS — L821 Other seborrheic keratosis: Secondary | ICD-10-CM | POA: Diagnosis not present

## 2021-05-31 DIAGNOSIS — Z6830 Body mass index (BMI) 30.0-30.9, adult: Secondary | ICD-10-CM | POA: Diagnosis not present

## 2021-05-31 DIAGNOSIS — L814 Other melanin hyperpigmentation: Secondary | ICD-10-CM | POA: Diagnosis not present

## 2021-05-31 DIAGNOSIS — D485 Neoplasm of uncertain behavior of skin: Secondary | ICD-10-CM | POA: Diagnosis not present

## 2021-05-31 DIAGNOSIS — D225 Melanocytic nevi of trunk: Secondary | ICD-10-CM | POA: Diagnosis not present

## 2021-05-31 DIAGNOSIS — D2262 Melanocytic nevi of left upper limb, including shoulder: Secondary | ICD-10-CM | POA: Diagnosis not present

## 2021-05-31 DIAGNOSIS — D2261 Melanocytic nevi of right upper limb, including shoulder: Secondary | ICD-10-CM | POA: Diagnosis not present

## 2021-05-31 DIAGNOSIS — E6609 Other obesity due to excess calories: Secondary | ICD-10-CM

## 2021-05-31 DIAGNOSIS — B353 Tinea pedis: Secondary | ICD-10-CM | POA: Diagnosis not present

## 2021-05-31 LAB — PULMONARY FUNCTION TEST
DL/VA % pred: 105 %
DL/VA: 4.34 ml/min/mmHg/L
DLCO cor % pred: 104 %
DLCO cor: 22.15 ml/min/mmHg
DLCO unc % pred: 104 %
DLCO unc: 22.15 ml/min/mmHg
FEF 25-75 Post: 1.65 L/sec
FEF 25-75 Pre: 1.12 L/sec
FEF2575-%Change-Post: 48 %
FEF2575-%Pred-Post: 73 %
FEF2575-%Pred-Pre: 49 %
FEV1-%Change-Post: 14 %
FEV1-%Pred-Post: 78 %
FEV1-%Pred-Pre: 68 %
FEV1-Post: 2.05 L
FEV1-Pre: 1.79 L
FEV1FVC-%Change-Post: 1 %
FEV1FVC-%Pred-Pre: 87 %
FEV6-%Change-Post: 11 %
FEV6-%Pred-Post: 90 %
FEV6-%Pred-Pre: 81 %
FEV6-Post: 2.98 L
FEV6-Pre: 2.66 L
FEV6FVC-%Change-Post: -1 %
FEV6FVC-%Pred-Post: 102 %
FEV6FVC-%Pred-Pre: 103 %
FVC-%Change-Post: 13 %
FVC-%Pred-Post: 88 %
FVC-%Pred-Pre: 78 %
FVC-Post: 3.03 L
FVC-Pre: 2.67 L
Post FEV1/FVC ratio: 68 %
Post FEV6/FVC ratio: 98 %
Pre FEV1/FVC ratio: 67 %
Pre FEV6/FVC Ratio: 100 %
RV % pred: 99 %
RV: 2.18 L
TLC % pred: 95 %
TLC: 5.12 L

## 2021-05-31 NOTE — Assessment & Plan Note (Signed)
-   She is doing well today. Her cough has resolved. She has no acute complaints today. Not currently on maintenance ICS/LABA. Most of her symptoms are felt to be upper airway. PFTs today did showed mild-moderate obstruction with positive BD repsonse. Normal diffusion capacity.  - Continue Albuterol hfa 2 puffs every 6 hours as needed. If she required SABA >2-3 times a week advised she resume BREO 287mcg once daily and notify our office  - Consider starting Montelukast 10mg  at bedtime, d/t possible adverse side effects including depressed mood and abnormal dreams she will discuss medication with her psychiatrist  - FU in 6 months with Dr. Lamonte Sakai

## 2021-05-31 NOTE — Patient Instructions (Signed)
Recommendations: - Use albuterol rescue inhaler 2 puffs every 6 hours for breakthrough shortness or breath/wheezing. I you find your self needing to use your rescue inhaler more than 2-3 times week recommend you resume Breo [id number]  - Ask psychiatry if ok to start Montelukast (Singulair)- see below drug information  - Call our office if you develop bronchitis symptoms, may need steroid shot and pred taper   Follow-up: - 6 months with Dr. Lamonte Sakai   Asthma, Adult  Asthma is a long-term (chronic) condition that causes recurrent episodes in which the airways become tight and narrow. The airways are the passages that lead from the nose and mouth down into the lungs. Asthma episodes, also called asthma attacks, can cause coughing, wheezing, shortness of breath, and chest pain. The airways can also fill with mucus. During an attack, it can be difficult to breathe. Asthmaattacks can range from minor to life threatening. Asthma cannot be cured, but medicines and lifestyle changes can help control itand treat acute attacks. What are the causes? This condition is believed to be caused by inherited (genetic) and environmental factors, but its exact cause is not known. There are many things that can bring on an asthma attack or make asthma symptoms worse (triggers). Asthma triggers are different for each person. Common triggers include: Mold. Dust. Cigarette smoke. Cockroaches. Things that can cause allergy symptoms (allergens), such as animal dander or pollen from trees or grass. Air pollutants such as household cleaners, wood smoke, smog, or Advertising account planner. Cold air, weather changes, and winds (which increase molds and pollen in the air). Strong emotional expressions such as crying or laughing hard. Stress. Certain medicines (such as aspirin) or types of medicines (such as beta-blockers). Sulfites in foods and drinks. Foods and drinks that may contain sulfites include dried fruit, potato chips, and  sparkling grape juice. Infections or inflammatory conditions such as the flu, a cold, or inflammation of the nasal membranes (rhinitis). Gastroesophageal reflux disease (GERD). Exercise or strenuous activity. What are the signs or symptoms? Symptoms of this condition may occur right after asthma is triggered or many hours later. Symptoms include: Wheezing. This can sound like whistling when you breathe. Excessive nighttime or early morning coughing. Frequent or severe coughing with a common cold. Chest tightness. Shortness of breath. Tiredness (fatigue) with minimal activity. How is this diagnosed? This condition is diagnosed based on: Your medical history. A physical exam. Tests, which may include: Lung function studies and pulmonary studies (spirometry). These tests can evaluate the flow of air in your lungs. Allergy tests. Imaging tests, such as X-rays. How is this treated? There is no cure for this condition, but treatment can help control your symptoms. Treatment for asthma usually involves: Identifying and avoiding your asthma triggers. Using medicines to control your symptoms. Generally, two types of medicines are used to treat asthma: Controller medicines. These help prevent asthma symptoms from occurring. They are usually taken every day. Fast-acting reliever or rescue medicines. These quickly relieve asthma symptoms by widening the narrow and tight airways. They are used as needed and provide short-term relief. Using supplemental oxygen. This may be needed during a severe episode. Using other medicines, such as: Allergy medicines, such as antihistamines, if your asthma attacks are triggered by allergens. Immune medicines (immunomodulators). These are medicines that help control the immune system. Creating an asthma action plan. An asthma action plan is a written plan for managing and treating your asthma attacks. This plan includes: A list of your asthma triggers and how to  avoid them. Information about when medicines should be taken and when their dosage should be changed. Instructions about using a device called a peak flow meter. A peak flow meter measures how well the lungs are working and the severity of your asthma. It helps you monitor your condition. Follow these instructions at home: Controlling your home environment Control your home environment in the following ways to help avoid triggers and prevent asthma attacks: Change your heating and air conditioning filter regularly. Limit your use of fireplaces and wood stoves. Get rid of pests (such as roaches and mice) and their droppings. Throw away plants if you see mold on them. Clean floors and dust surfaces regularly. Use unscented cleaning products. Try to have someone else vacuum for you regularly. Stay out of rooms while they are being vacuumed and for a short while afterward. If you vacuum, use a dust mask from a hardware store, a double-layered or microfilter vacuum cleaner bag, or a vacuum cleaner with a HEPA filter. Replace carpet with wood, tile, or vinyl flooring. Carpet can trap dander and dust. Use allergy-proof pillows, mattress covers, and box spring covers. Keep your bedroom a trigger-free room. Avoid pets and keep windows closed when allergens are in the air. Wash beddings every week in hot water and dry them in a dryer. Use blankets that are made of polyester or cotton. Clean bathrooms and kitchens with bleach. If possible, have someone repaint the walls in these rooms with mold-resistant paint. Stay out of the rooms that are being cleaned and painted. Wash your hands often with soap and water. If soap and water are not available, use hand sanitizer. Do not allow anyone to smoke in your home. General instructions Take over-the-counter and prescription medicines only as told by your health care provider. Speak with your health care provider if you have questions about how or when to take  the medicines. Make note if you are requiring more frequent dosages. Do not use any products that contain nicotine or tobacco, such as cigarettes and e-cigarettes. If you need help quitting, ask your health care provider. Also, avoid being exposed to secondhand smoke. Use a peak flow meter as told by your health care provider. Record and keep track of the readings. Understand and use the asthma action plan to help minimize, or stop an asthma attack, without needing to seek medical care. Make sure you stay up to date on your yearly vaccinations as told by your health care provider. This may include vaccines for the flu and pneumonia. Avoid outdoor activities when allergen counts are high and when air quality is low. Wear a ski mask that covers your nose and mouth during outdoor winter activities. Exercise indoors on cold days if you can. Warm up before exercising, and take time for a cool-down period after exercise. Keep all follow-up visits as told by your health care provider. This is important. Where to find more information For information about asthma, turn to the Centers for Disease Control and Prevention at http://www.clark.net/ For air quality information, turn to [employer] at https://www.miller-reyes.info/ Contact a health care provider if: You have wheezing, shortness of breath, or a cough even while you are taking medicine to prevent attacks. The mucus you cough up (sputum) is thicker than usual. Your sputum changes from clear or white to yellow, green, gray, or bloody. Your medicines are causing side effects, such as a rash, itching, swelling, or trouble breathing. You need to use a reliever medicine more than 2-3 times a week.  Your peak flow reading is still at 50-79% of your personal best after following your action plan for 1 hour. You have a fever. Get help right away if: You are getting worse and do not respond to treatment during an asthma attack. You are short of breath when at rest or when doing  very little physical activity. You have difficulty eating, drinking, or talking. You have chest pain or tightness. You develop a fast heartbeat or palpitations. You have a bluish color to your lips or fingernails. You are light-headed or dizzy, or you faint. Your peak flow reading is less than 50% of your personal best. You feel too tired to breathe normally. Summary Asthma is a long-term (chronic) condition that causes recurrent episodes in which the airways become tight and narrow. These episodes can cause coughing, wheezing, shortness of breath, and chest pain. Asthma cannot be cured, but medicines and lifestyle changes can help control it and treat acute attacks. Make sure you understand how to avoid triggers and how and when to use your medicines. Asthma attacks can range from minor to life threatening. Get help right away if you have an asthma attack and do not respond to treatment with your usual rescue medicines. This information is not intended to replace advice given to you by your health care provider. Make sure you discuss any questions you have with your healthcare provider. Document Revised: 07/31/2020 Document Reviewed: 03/04/2020 Elsevier Patient Education  [place].    Montelukast Tablets What is this medication? MONTELUKAST (mon te LOO kast) prevents and treats the symptoms of asthma and allergies. It works by decreasing inflammation in the airways, making it easierto breathe. Do not use this medication to treat a sudden asthma attack. This medicine may be used for other purposes; ask your health care provider orpharmacist if you have questions. COMMON BRAND NAME(S): Singulair What should I tell my care team before I take this medication? They need to know if you have any of these conditions: Liver disease An unusual or allergic reaction to montelukast, other medications, foods, dyes, or preservatives Pregnant or trying to get pregnant Breast-feeding How  should I use this medication? Take this medication by mouth with water. Take it as directed on the prescription label at the same time every day. You can take this medication with or without food. If it upsets your stomach, take it with food. Keep takingit unless your care team tells you to stop. A special MedGuide will be given to you by the pharmacist with eachprescription and refill. Be sure to read this information carefully each time. Talk to your care team about the use of this medication in children. While this medication may be prescribed for children as young as 15 years for selectedconditions, precautions do apply. Overdosage: If you think you have taken too much of this medicine contact apoison control center or emergency room at once. NOTE: This medicine is only for you. Do not share this medicine with others. What if I miss a dose? If you miss a dose, skip it. Take your next dose at the normal time. Do nottake extra or 2 doses at the same time to make up for the missed dose. What may interact with this medication? Medications for seizures like phenytoin, phenobarbital, and carbamazepine Rifabutin Rifampin This list may not describe all possible interactions. Give your health care provider a list of all the medicines, herbs, non-prescription drugs, or dietary supplements you use. Also tell them if you smoke, drink alcohol, or  use illegaldrugs. Some items may interact with your medicine. What should I watch for while using this medication? Visit your health care provider for regular checks on your progress. Tell your health care provider if your allergy or asthma symptoms do not improve. Takeyour medication even when you do not have symptoms. If you have asthma, talk to your health care provider about what to do in an acute asthma attack. Always have your rescue medication for asthma attacks withyou. Patients and their families should watch for new or worsening thoughts of suicide or  depression. Also watch for sudden changes in feelings such as feeling anxious, agitated, panicky, irritable, hostile, aggressive, impulsive, severely restless, overly excited and hyperactive, or not being able to sleep. Any worsening of mood or thoughts of suicide or dying should be reported toyour health care provider right away. What side effects may I notice from receiving this medication? Side effects that you should report to your care team as soon as possible: Allergic reactions-skin rash, itching, hives, swelling of the face, lips, tongue, or throat Flu-like symptoms-fever, chills, muscle pain, cough, headache, fatigue Mood and behavior changes such as anxiety, nervousness, confusion, hallucinations, irritability, hostility, thoughts of suicide or self-harm, worsening mood, feelings of depression Pain, tingling, or numbness in the hands or feet Sinus pain or pressure around the face or forehead Trouble sleeping Vivid dreams or nightmares Side effects that usually do not require medical attention (report to your careteam if they continue or are bothersome): Cough Diarrhea Headache Runny or stuffy nose Sore throat Stomach pain This list may not describe all possible side effects. Call your doctor for medical advice about side effects. You may report side effects to FDA at1-800-FDA-1088. Where should I keep my medication? Keep out of the reach of children and pets. Store at room temperature between 15 and 30 degrees C (59 and 86 degrees F). Protect from light and moisture. Protect from light and moisture. Keep the container tightly closed. Get rid of any unused medication after the expirationdate. To get rid of medications that are no longer needed or expired: Take the medication to a medication take-back program. Check with your pharmacy or law enforcement to find a location. If you cannot return the medication, check the label or package insert to see if the medication should be thrown  out in the garbage or flushed down the toilet. If you are not sure, ask your care team. If it is safe to put in the trash, empty the medication out of the container. Mix the medication with cat litter, dirt, coffee grounds, or other unwanted substance. Seal the mixture in a bag or container. Put it in the trash. NOTE: This sheet is a summary. It may not cover all possible information. If you have questions about this medicine, talk to your doctor, pharmacist, orhealth care provider.  2022 Elsevier/Gold Standard (2020-11-27 11:01:47)

## 2021-05-31 NOTE — Assessment & Plan Note (Signed)
-   Patient is motivated to lose weight, she is working with nutritionist and wants to start walking again. Goal weight is 145lbs

## 2021-05-31 NOTE — Assessment & Plan Note (Signed)
Resolved

## 2021-05-31 NOTE — Progress Notes (Signed)
PFT done today. 

## 2021-05-31 NOTE — Progress Notes (Signed)
@Patient  ID: Unknown Nicole [name], female    DOB: 29-Dec-1955, 65 y.o.   MRN: [id number]  Chief Complaint  Patient presents with   Follow-up    PFT review, Patient did not have concerns.     Referring provider: Gaynelle Arabian, MD  HPI: 65 year old female, former smoker. PMH significant for HTN, intrinsic asthma, irritable larynx, cough,  PND, obesity. Patient of Dr. Lamonte Sakai, last seen in office on 12/14/20 for cough.  05/31/2021 Patient presents today for PFTs. During last visit she had a persistent cough with white mucus in the morning, this cleared as the day went on. Occasional chest tightness that did respond to albuterol. Cough was felt to be more upper airway in nature, however, has responded to prednisone in the past temporarily. She was advised not to re-start Breo.  She was started on Flonase nasal spray. Continue albuterol hfa as needed, Nexium 40mg  BID. Ordered for PFTs to assess of obstruction.   She is doing well today. Her cough has resolved. She has no acute complaints today. She has rare nasal congestion but denies post nasal drip. She uses saline rinse once daily. She works in [place]. She states that fire/smoke and strong smells bother her. She is not currently on maintenance inhaler. She has not used albuterol hfa more than once in the past year. When she gets bronchitis symptoms she needs typically needs steroid injection and oral prednisone taper. She has not had bronchitis symptoms since covid started and we began wearing masks. 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 Her goal weight is 145lbs. She will be starting celexa 10mg  today, she has an apt with psychiatry tomorrow.  Pulmonary function testing: 05/31/2021 FVC 3.03 (88%), FEV1 2.05 (78%), ratio 68, TLC 95%, DLCOunc 22.15 (104%)  Moderate obstruction with positive BS response. Normal diffusion capacity.   Allergies  Allergen Reactions   Hctz [Hydrochlorothiazide] Other  (See Comments)    MUSCLE CRAMPING   Hydralazine Hcl Other (See Comments)    Cramping   Metoclopramide Hcl Other (See Comments)    Cramping, anxiety and depression; "makes me crazy"   Other Other (See Comments)    Cramping   Tramadol Other (See Comments)    "wig out"   Voltaren [Diclofenac Sodium] Other (See Comments)    Swelling in leg and became parched.    Avelox [Moxifloxacin Hcl In Nacl] Nausea And Vomiting   Clindamycin Rash   Clindamycin/Lincomycin Rash   Flagyl [Metronidazole] Rash   Moxifloxacin Rash   Neosporin [Neomycin-Bacitracin Zn-Polymyx] Other (See Comments)    Blisters, rash   Pristiq [Desvenlafaxine Succinate Er] Other (See Comments)    Intolerance.     Immunization History  Administered Date(s) Administered   Influenza Split 08/31/2016, 08/09/2018, 08/07/2019   Influenza Whole 07/16/2011   Influenza,inj,Quad PF,6+ Mos 08/14/2015, 08/23/2017, 08/05/2018   Influenza,inj,quad, With Preservative 08/05/2018   Influenza-Unspecified 09/11/2020   PFIZER(Purple Top)SARS-COV-2 Vaccination 11/07/2019, 12/01/2019, 09/03/2020   PPD Test 08/14/2018   Tdap 07/26/2011   Zoster, Live 12/11/2015    Past Medical History:  Diagnosis Date   Anxiety    Arthritis    lumbar- HNP, /w myelopathy   Asthma    /w reflux , consult /w Dr. Wert-2013 / Jodi Mourning INHALER PRN INFREQUENTLY   Bronchitis    used albuterol , Sept. 2013, all clear now    Complication of anesthesia    Difficulty sleeping    DUE TO PAIN   Family history  of anesthesia complication    N&V- Mother    GERD (gastroesophageal reflux disease)    Chron's disease, not medically treating currently    H/O hiatal hernia    small hiatal hernia, has had endoscopy & colondoscopy   H/O vertigo    Heart murmur    since birth   Hypertension    followed by Dr. Marisue Humble    Numbness in left leg    PONV (postoperative nausea and vomiting)    Stress incontinence     Tobacco History: Social History   Tobacco Use   Smoking Status Former   Packs/day: 1.50   Years: 27.00   Pack years: 40.50   Types: Cigarettes   Quit date: 11/15/1999   Years since quitting: 21.5  Smokeless Tobacco Never   Counseling given: Not Answered   Outpatient Medications Prior to Visit  Medication Sig Dispense Refill   albuterol (VENTOLIN HFA) 108 (90 Base) MCG/ACT inhaler INHALE 2 PUFFS INTO THE LUNGS EVERY 6 HOURS AS NEEDED FOR WHEEZING OR SHORTNESS OF BREATH * NEEDS APPT 8.5 g 0   ALPRAZolam (XANAX) 0.5 MG tablet Take 0.5 mg by mouth as needed for anxiety or sleep.      diltiazem (CARDIZEM CD) 180 MG 24 hr capsule TAKE 1 CAPSULE BY MOUTH DAILY. 90 capsule 3   esomeprazole (NEXIUM) 40 MG capsule Take 40 mg by mouth 2 (two) times daily before a meal.   1   fluticasone (FLONASE) 50 MCG/ACT nasal spray Place 2 sprays into both nostrils daily as needed (For cold symptoms).     fluticasone furoate-vilanterol (BREO ELLIPTA) 200-25 MCG/INH AEPB INHALE 1 PUFFS BY MOUTH INTO LUNGS DAILY AS NEEDED FOR COLD SYMPTOMS * NEEDS APPT FOR REFILLS 60 each 0   losartan (COZAAR) 100 MG tablet TAKE 1 TABLET BY MOUTH DAILY. 90 tablet 3   mesalamine (APRISO) 0.375 g 24 hr capsule Take 4 capsules by mouth every morning 120 capsule 5   PROLENSA 0.07 % SOLN INSTILL 1 DROP INTO LEFT EYE AT BEDTIME AS DIRECTED 3 mL 1   PROLENSA 0.07 % SOLN APPLY 1 DROP INTO RIGHT EYE AT BEDTIME AS DIRECTED 3 mL 1   sucralfate (CARAFATE) 1 g tablet Take 1 tablet by mouth three times daily on an empty stomach (before meals) 90 tablet 2   valACYclovir (VALTREX) 1000 MG tablet Take 1,000 mg by mouth as needed (Fever blisters.).   0   valACYclovir (VALTREX) 1000 MG tablet TAKE 2 TABLETS BY MOUTH 2 TIMES DAILY FOR 2 DAYS AT FIRST SIGN OF FEVER BLISTER 90 tablet 3   citalopram (CELEXA) 10 MG tablet TAKE 1/2 TO 1 TABLET BY MOUTH DAILY (Patient not taking: Reported on 05/31/2021) 90 tablet 4   ALPRAZolam (XANAX) 0.5 MG tablet TAKE 1 TABLET BY MOUTH 3 TIMES DAILY AS NEEDED (MUST  LAST 30 DAYS). (Patient not taking: Reported on 05/31/2021) 21 tablet 0   ALPRAZolam (XANAX) 0.5 MG tablet TAKE 1 TABLET BY MOUTH 3 TIMES DAILY AS NEEDED (MUST LAST 30 DAYS).. (Patient not taking: Reported on 05/31/2021) 90 tablet 0   ALPRAZolam (XANAX) 0.5 MG tablet TAKE 1 TABLET BY MOUTH 3 TIMES DAILY AS NEEDED (MUST LAST 90 DAYS). (Patient not taking: Reported on 05/31/2021) 270 tablet 0   cephALEXin (KEFLEX) 500 MG capsule Take 1 capsule (500 mg total) by mouth 3 (three) times daily for 5 days (Patient not taking: Reported on 05/31/2021) 15 capsule 0   DUREZOL 0.05 % EMUL APPLY 1 DROP INTO LEFT EYE  THREE TIMES A DAY AS DIRECTED (Patient not taking: Reported on 05/31/2021) 5 mL 1   DUREZOL 0.05 % EMUL APPLY 1 DROP INTO RIGHT EYE THREE TIMES A DAY AS DIRECTED (Patient not taking: Reported on 05/31/2021) 5 mL 1   escitalopram (LEXAPRO) 10 MG tablet Take 1 tablet by mouth every day (Patient not taking: Reported on 05/31/2021) 90 tablet 4   tobramycin (TOBREX) 0.3 % ophthalmic solution APPLY 1 DROP INTO LEFT EYE 4 TIMES A DAY AS DIRECTED (Patient not taking: Reported on 05/31/2021) 5 mL 1   tobramycin (TOBREX) 0.3 % ophthalmic solution INSTILL 1 DROP INTO RIGHT EYE FOUR TIMES DAILY AS DIRECTED (Patient not taking: Reported on 05/31/2021) 5 mL 1   No facility-administered medications prior to visit.    Review of Systems  Review of Systems  Constitutional: Negative.   Respiratory:  Negative for cough, shortness of breath and wheezing.     Physical Exam  BP 104/60 (BP Location: Left Arm, Patient Position: Sitting, Cuff Size: Normal)   Pulse 79   Temp 97.8 F (36.6 C) (Oral)   Ht 5\' 6"  (1.676 m)   Wt 190 lb 9.6 oz (86.5 kg)   SpO2 97%   BMI 30.76 kg/m  Physical Exam Constitutional:      Appearance: Normal appearance.  HENT:     Head: Normocephalic and atraumatic.     Mouth/Throat:     Mouth: Mucous membranes are moist.     Pharynx: Oropharynx is clear.  Cardiovascular:     Rate and Rhythm:  Normal rate and regular rhythm.  Pulmonary:     Effort: Pulmonary effort is normal.     Breath sounds: Wheezing present. No rhonchi or rales.     Comments: Exp wheezing upper lobes mainly, otherwise clear  Skin:    General: Skin is warm and dry.  Neurological:     General: No focal deficit present.     Mental Status: She is alert and oriented to person, place, and time. Mental status is at baseline.  Psychiatric:        Mood and Affect: Mood normal.        Behavior: Behavior normal.        Thought Content: Thought content normal.        Judgment: Judgment normal.     Lab Results:  CBC    Component Value Date/Time   WBC 13.5 (H) 02/12/2020 1131   RBC 4.50 02/12/2020 1131   HGB 13.9 02/12/2020 1131   HCT 42.2 02/12/2020 1131   PLT 289 02/12/2020 1131   MCV 93.8 02/12/2020 1131   MCH 30.9 02/12/2020 1131   MCHC 32.9 02/12/2020 1131   RDW 12.9 02/12/2020 1131   LYMPHSABS 2.1 03/16/2010 1740   MONOABS 0.5 03/16/2010 1740   EOSABS 0.0 03/16/2010 1740   BASOSABS 0.0 03/16/2010 1740    BMET    Component Value Date/Time   NA 138 02/12/2020 1131   K 4.0 02/12/2020 1131   CL 102 02/12/2020 1131   CO2 26 02/12/2020 1131   GLUCOSE 133 (H) 02/12/2020 1131   BUN 9 02/12/2020 1131   CREATININE 0.82 02/12/2020 1131   CALCIUM 9.7 02/12/2020 1131   GFRNONAA >60 02/12/2020 1131   GFRAA >60 02/12/2020 1131    BNP No results found for: BNP  ProBNP No results found for: PROBNP  Imaging: No results found.   Assessment & Plan:   Intrinsic asthma - She is doing well today. Her cough has resolved. She has  no acute complaints today. Not currently on maintenance ICS/LABA. Most of her symptoms are felt to be upper airway. PFTs today did showed mild-moderate obstruction with positive BD repsonse. Normal diffusion capacity.  - Continue Albuterol hfa 2 puffs every 6 hours as needed. If she required SABA >2-3 times a week advised she resume BREO 287mcg once daily and notify our office   - Consider starting Montelukast 10mg  at bedtime, d/t possible adverse side effects including depressed mood and abnormal dreams she will discuss medication with her psychiatrist  - FU in 6 months with Dr. Lamonte Sakai   Obesity - Patient is motivated to lose weight, she is working with nutritionist and wants to start walking again. Goal weight is 145lbs  Cough - Resolved    Martyn Ehrich, NP 05/31/2021

## 2021-06-08 DIAGNOSIS — M25561 Pain in right knee: Secondary | ICD-10-CM | POA: Diagnosis not present

## 2021-06-08 DIAGNOSIS — R2681 Unsteadiness on feet: Secondary | ICD-10-CM | POA: Diagnosis not present

## 2021-06-08 DIAGNOSIS — M25562 Pain in left knee: Secondary | ICD-10-CM | POA: Diagnosis not present

## 2021-06-14 ENCOUNTER — Other Ambulatory Visit (HOSPITAL_COMMUNITY): Payer: Self-pay

## 2021-06-15 ENCOUNTER — Ambulatory Visit: Payer: PPO | Admitting: Podiatry

## 2021-06-15 ENCOUNTER — Other Ambulatory Visit (HOSPITAL_COMMUNITY): Payer: Self-pay

## 2021-06-15 ENCOUNTER — Other Ambulatory Visit: Payer: Self-pay | Admitting: Gastroenterology

## 2021-06-15 ENCOUNTER — Ambulatory Visit
Admission: RE | Admit: 2021-06-15 | Discharge: 2021-06-15 | Disposition: A | Discharge status: Home / Self Care | Payer: PPO | Source: Ambulatory Visit | Attending: Gastroenterology | Admitting: Gastroenterology

## 2021-06-15 ENCOUNTER — Ambulatory Visit: Payer: PPO

## 2021-06-15 ENCOUNTER — Other Ambulatory Visit: Payer: Self-pay

## 2021-06-15 DIAGNOSIS — L301 Dyshidrosis [pompholyx]: Secondary | ICD-10-CM | POA: Diagnosis not present

## 2021-06-15 DIAGNOSIS — K5000 Crohn's disease of small intestine without complications: Secondary | ICD-10-CM

## 2021-06-15 DIAGNOSIS — R109 Unspecified abdominal pain: Secondary | ICD-10-CM | POA: Diagnosis not present

## 2021-06-15 DIAGNOSIS — R1110 Vomiting, unspecified: Secondary | ICD-10-CM

## 2021-06-15 MED ORDER — BETAMETHASONE DIPROPIONATE 0.05 % EX CREA
TOPICAL_CREAM | Freq: Two times a day (BID) | CUTANEOUS | 3 refills | Status: DC
  Filled 2021-06-15: qty 45, 20d supply, fill #0

## 2021-06-15 MED FILL — Losartan Potassium Tab 100 MG: ORAL | 90 days supply | Qty: 90 | Fill #1 | Status: AC

## 2021-06-15 NOTE — Progress Notes (Signed)
   HPI: 65 y.o. female presenting today as a new patient for evaluation of skin lesions developing to the patient's left foot.  Patient states that she has had these lesions for the past 40 years.  She has acute flareups that will eventually resolve and go away for several months.  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  She presents today for second opinion and further treatment and evaluation  Past Medical History:  Diagnosis Date   Anxiety    Arthritis    lumbar- HNP, /w myelopathy   Asthma    /w reflux , consult /w Dr. Wert-2013 / Lillia Pauls PRN INFREQUENTLY   Bronchitis    used albuterol , Sept. 2013, all clear now    Complication of anesthesia    Difficulty sleeping    DUE TO PAIN   Family history of anesthesia complication    N&V- Mother    GERD (gastroesophageal reflux disease)    Chron's disease, not medically treating currently    H/O hiatal hernia    small hiatal hernia, has had endoscopy & colondoscopy   H/O vertigo    Heart murmur    since birth   Hypertension    followed by Dr. Marisue Humble    Numbness in left leg    PONV (postoperative nausea and vomiting)    Stress incontinence      Physical Exam: General: The patient is alert and oriented x3 in no acute distress.  Dermatology: Skin is warm, dry and supple bilateral lower extremities. Negative for open lesions or macerations.  Small vesicles noted along the plantar medial arch of the left foot with serous/viscous drainage and inflammation of the skin.  Vascular: Palpable pedal pulses bilaterally. No edema or erythema noted. Capillary refill within normal limits.  Neurological: Epicritic and protective threshold grossly intact bilaterally.   Musculoskeletal Exam: No pedal deformities noted  Assessment: 1.  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 Patient evaluated.  2.  Prescription for betamethasone 0.05% cream to be  used as needed to the left foot for acute flareups 3.  I do not believe this is fungal in nature.  Believe this is more strictly associated to the dyshidrotic dermatitis. 4.  Recommend good foot hygiene 5.  Return to clinic as needed  *Traveling nurse.  Friends with [employer] (patients)      Edrick Kins, DPM [facility]  Dr. Edrick Kins, DPM    [address], [place] [address]                Office [phone]  Fax [phone]

## 2021-06-16 ENCOUNTER — Other Ambulatory Visit (HOSPITAL_COMMUNITY): Payer: Self-pay

## 2021-06-16 ENCOUNTER — Other Ambulatory Visit: Payer: Self-pay | Admitting: Gastroenterology

## 2021-06-16 DIAGNOSIS — K5000 Crohn's disease of small intestine without complications: Secondary | ICD-10-CM | POA: Diagnosis not present

## 2021-06-16 DIAGNOSIS — K50019 Crohn's disease of small intestine with unspecified complications: Secondary | ICD-10-CM

## 2021-06-16 DIAGNOSIS — K5900 Constipation, unspecified: Secondary | ICD-10-CM | POA: Diagnosis not present

## 2021-06-21 DIAGNOSIS — M25561 Pain in right knee: Secondary | ICD-10-CM | POA: Diagnosis not present

## 2021-06-21 DIAGNOSIS — M25562 Pain in left knee: Secondary | ICD-10-CM | POA: Diagnosis not present

## 2021-06-21 DIAGNOSIS — R2681 Unsteadiness on feet: Secondary | ICD-10-CM | POA: Diagnosis not present

## 2021-06-28 DIAGNOSIS — R2681 Unsteadiness on feet: Secondary | ICD-10-CM | POA: Diagnosis not present

## 2021-06-28 DIAGNOSIS — M25561 Pain in right knee: Secondary | ICD-10-CM | POA: Diagnosis not present

## 2021-06-28 DIAGNOSIS — M25562 Pain in left knee: Secondary | ICD-10-CM | POA: Diagnosis not present

## 2021-06-29 DIAGNOSIS — R2681 Unsteadiness on feet: Secondary | ICD-10-CM | POA: Diagnosis not present

## 2021-06-29 DIAGNOSIS — M25562 Pain in left knee: Secondary | ICD-10-CM | POA: Diagnosis not present

## 2021-06-29 DIAGNOSIS — M25561 Pain in right knee: Secondary | ICD-10-CM | POA: Diagnosis not present

## 2021-07-05 ENCOUNTER — Other Ambulatory Visit: Payer: PPO

## 2021-07-08 DIAGNOSIS — R2681 Unsteadiness on feet: Secondary | ICD-10-CM | POA: Diagnosis not present

## 2021-07-08 DIAGNOSIS — M25561 Pain in right knee: Secondary | ICD-10-CM | POA: Diagnosis not present

## 2021-07-08 DIAGNOSIS — M25562 Pain in left knee: Secondary | ICD-10-CM | POA: Diagnosis not present

## 2021-07-09 DIAGNOSIS — M25562 Pain in left knee: Secondary | ICD-10-CM | POA: Diagnosis not present

## 2021-07-09 DIAGNOSIS — R2681 Unsteadiness on feet: Secondary | ICD-10-CM | POA: Diagnosis not present

## 2021-07-09 DIAGNOSIS — M25561 Pain in right knee: Secondary | ICD-10-CM | POA: Diagnosis not present

## 2021-07-11 MED FILL — Diltiazem HCl Coated Beads Cap ER 24HR 180 MG: ORAL | 90 days supply | Qty: 90 | Fill #1 | Status: AC

## 2021-07-12 ENCOUNTER — Other Ambulatory Visit (HOSPITAL_COMMUNITY): Payer: Self-pay

## 2021-07-12 DIAGNOSIS — M25561 Pain in right knee: Secondary | ICD-10-CM | POA: Diagnosis not present

## 2021-07-12 DIAGNOSIS — R2681 Unsteadiness on feet: Secondary | ICD-10-CM | POA: Diagnosis not present

## 2021-07-12 DIAGNOSIS — M25562 Pain in left knee: Secondary | ICD-10-CM | POA: Diagnosis not present

## 2021-07-20 ENCOUNTER — Other Ambulatory Visit: Payer: Self-pay

## 2021-07-20 ENCOUNTER — Ambulatory Visit
Admission: RE | Admit: 2021-07-20 | Discharge: 2021-07-20 | Disposition: A | Discharge status: Home / Self Care | Payer: PPO | Source: Ambulatory Visit | Attending: Gastroenterology | Admitting: Gastroenterology

## 2021-07-20 DIAGNOSIS — K529 Noninfective gastroenteritis and colitis, unspecified: Secondary | ICD-10-CM | POA: Diagnosis not present

## 2021-07-20 DIAGNOSIS — K50019 Crohn's disease of small intestine with unspecified complications: Secondary | ICD-10-CM

## 2021-07-20 MED ORDER — IOPAMIDOL (ISOVUE-300) INJECTION 61%
100.0000 mL | Freq: Once | INTRAVENOUS | Status: AC | PRN
  Administered 2021-07-20: 100 mL via INTRAVENOUS

## 2021-07-26 ENCOUNTER — Other Ambulatory Visit (HOSPITAL_COMMUNITY): Payer: Self-pay

## 2021-07-27 ENCOUNTER — Other Ambulatory Visit (HOSPITAL_COMMUNITY): Payer: Self-pay

## 2021-08-12 ENCOUNTER — Other Ambulatory Visit (HOSPITAL_COMMUNITY): Payer: Self-pay

## 2021-08-12 DIAGNOSIS — R102 Pelvic and perineal pain: Secondary | ICD-10-CM | POA: Diagnosis not present

## 2021-08-12 DIAGNOSIS — M792 Neuralgia and neuritis, unspecified: Secondary | ICD-10-CM | POA: Diagnosis not present

## 2021-08-12 DIAGNOSIS — Z23 Encounter for immunization: Secondary | ICD-10-CM | POA: Diagnosis not present

## 2021-08-12 MED ORDER — PREDNISONE 20 MG PO TABS
ORAL_TABLET | ORAL | 0 refills | Status: DC
  Filled 2021-08-12: qty 18, 9d supply, fill #0

## 2021-08-25 DIAGNOSIS — M25561 Pain in right knee: Secondary | ICD-10-CM | POA: Diagnosis not present

## 2021-08-25 DIAGNOSIS — M25562 Pain in left knee: Secondary | ICD-10-CM | POA: Diagnosis not present

## 2021-08-26 ENCOUNTER — Other Ambulatory Visit (HOSPITAL_COMMUNITY): Payer: Self-pay

## 2021-08-26 DIAGNOSIS — L988 Other specified disorders of the skin and subcutaneous tissue: Secondary | ICD-10-CM | POA: Diagnosis not present

## 2021-08-26 DIAGNOSIS — D485 Neoplasm of uncertain behavior of skin: Secondary | ICD-10-CM | POA: Diagnosis not present

## 2021-08-26 MED ORDER — MUPIROCIN 2 % EX OINT
TOPICAL_OINTMENT | CUTANEOUS | 0 refills | Status: AC
  Filled 2021-08-26: qty 22, 30d supply, fill #0

## 2021-09-01 ENCOUNTER — Other Ambulatory Visit (HOSPITAL_COMMUNITY): Payer: Self-pay

## 2021-09-02 ENCOUNTER — Other Ambulatory Visit (HOSPITAL_COMMUNITY): Payer: Self-pay

## 2021-09-06 DIAGNOSIS — M25561 Pain in right knee: Secondary | ICD-10-CM | POA: Diagnosis not present

## 2021-09-06 DIAGNOSIS — R2681 Unsteadiness on feet: Secondary | ICD-10-CM | POA: Diagnosis not present

## 2021-09-06 DIAGNOSIS — M25562 Pain in left knee: Secondary | ICD-10-CM | POA: Diagnosis not present

## 2021-09-07 ENCOUNTER — Other Ambulatory Visit (HOSPITAL_COMMUNITY): Payer: Self-pay

## 2021-09-07 MED FILL — Losartan Potassium Tab 100 MG: ORAL | 90 days supply | Qty: 90 | Fill #2 | Status: AC

## 2021-09-13 DIAGNOSIS — M25561 Pain in right knee: Secondary | ICD-10-CM | POA: Diagnosis not present

## 2021-09-13 DIAGNOSIS — R2681 Unsteadiness on feet: Secondary | ICD-10-CM | POA: Diagnosis not present

## 2021-09-13 DIAGNOSIS — M25562 Pain in left knee: Secondary | ICD-10-CM | POA: Diagnosis not present

## 2021-09-14 DIAGNOSIS — M25562 Pain in left knee: Secondary | ICD-10-CM | POA: Diagnosis not present

## 2021-09-14 DIAGNOSIS — R2681 Unsteadiness on feet: Secondary | ICD-10-CM | POA: Diagnosis not present

## 2021-09-14 DIAGNOSIS — M25561 Pain in right knee: Secondary | ICD-10-CM | POA: Diagnosis not present

## 2021-09-20 DIAGNOSIS — M25561 Pain in right knee: Secondary | ICD-10-CM | POA: Diagnosis not present

## 2021-09-20 DIAGNOSIS — R2681 Unsteadiness on feet: Secondary | ICD-10-CM | POA: Diagnosis not present

## 2021-09-20 DIAGNOSIS — M25562 Pain in left knee: Secondary | ICD-10-CM | POA: Diagnosis not present

## 2021-09-21 DIAGNOSIS — M25561 Pain in right knee: Secondary | ICD-10-CM | POA: Diagnosis not present

## 2021-09-21 DIAGNOSIS — M25562 Pain in left knee: Secondary | ICD-10-CM | POA: Diagnosis not present

## 2021-09-21 DIAGNOSIS — R2681 Unsteadiness on feet: Secondary | ICD-10-CM | POA: Diagnosis not present

## 2021-09-27 DIAGNOSIS — M25561 Pain in right knee: Secondary | ICD-10-CM | POA: Diagnosis not present

## 2021-09-27 DIAGNOSIS — M25562 Pain in left knee: Secondary | ICD-10-CM | POA: Diagnosis not present

## 2021-09-27 DIAGNOSIS — R2681 Unsteadiness on feet: Secondary | ICD-10-CM | POA: Diagnosis not present

## 2021-09-28 ENCOUNTER — Other Ambulatory Visit (HOSPITAL_COMMUNITY): Payer: Self-pay

## 2021-09-28 DIAGNOSIS — M25562 Pain in left knee: Secondary | ICD-10-CM | POA: Diagnosis not present

## 2021-09-28 DIAGNOSIS — R2681 Unsteadiness on feet: Secondary | ICD-10-CM | POA: Diagnosis not present

## 2021-09-28 DIAGNOSIS — M25561 Pain in right knee: Secondary | ICD-10-CM | POA: Diagnosis not present

## 2021-09-28 MED ORDER — ALPRAZOLAM 0.5 MG PO TABS
ORAL_TABLET | ORAL | 0 refills | Status: DC
  Filled 2021-09-28: qty 270, 90d supply, fill #0

## 2021-09-28 MED FILL — Citalopram Hydrobromide Tab 10 MG (Base Equiv): ORAL | 90 days supply | Qty: 90 | Fill #0 | Status: AC

## 2021-10-01 ENCOUNTER — Other Ambulatory Visit (HOSPITAL_COMMUNITY): Payer: Self-pay

## 2021-10-11 ENCOUNTER — Other Ambulatory Visit (HOSPITAL_COMMUNITY): Payer: Self-pay

## 2021-10-11 MED FILL — Diltiazem HCl Coated Beads Cap ER 24HR 180 MG: ORAL | 90 days supply | Qty: 90 | Fill #2 | Status: AC

## 2021-10-12 ENCOUNTER — Other Ambulatory Visit (HOSPITAL_COMMUNITY): Payer: Self-pay

## 2021-10-22 ENCOUNTER — Other Ambulatory Visit (HOSPITAL_COMMUNITY): Payer: Self-pay

## 2021-10-22 DIAGNOSIS — I10 Essential (primary) hypertension: Secondary | ICD-10-CM | POA: Diagnosis not present

## 2021-10-22 DIAGNOSIS — Z6830 Body mass index (BMI) 30.0-30.9, adult: Secondary | ICD-10-CM | POA: Diagnosis not present

## 2021-10-22 DIAGNOSIS — M5412 Radiculopathy, cervical region: Secondary | ICD-10-CM | POA: Diagnosis not present

## 2021-10-23 ENCOUNTER — Other Ambulatory Visit: Payer: Self-pay | Admitting: Neurological Surgery

## 2021-10-23 DIAGNOSIS — M5412 Radiculopathy, cervical region: Secondary | ICD-10-CM

## 2021-10-26 ENCOUNTER — Other Ambulatory Visit (HOSPITAL_COMMUNITY): Payer: Self-pay

## 2021-10-27 ENCOUNTER — Other Ambulatory Visit (HOSPITAL_COMMUNITY): Payer: Self-pay

## 2021-11-01 ENCOUNTER — Ambulatory Visit
Admission: RE | Admit: 2021-11-01 | Discharge: 2021-11-01 | Disposition: A | Discharge status: Home / Self Care | Payer: PPO | Source: Ambulatory Visit | Attending: Neurological Surgery | Admitting: Neurological Surgery

## 2021-11-01 ENCOUNTER — Other Ambulatory Visit: Payer: Self-pay

## 2021-11-01 DIAGNOSIS — M47812 Spondylosis without myelopathy or radiculopathy, cervical region: Secondary | ICD-10-CM | POA: Diagnosis not present

## 2021-11-01 DIAGNOSIS — M50223 Other cervical disc displacement at C6-C7 level: Secondary | ICD-10-CM | POA: Diagnosis not present

## 2021-11-01 DIAGNOSIS — R531 Weakness: Secondary | ICD-10-CM | POA: Diagnosis not present

## 2021-11-01 DIAGNOSIS — M50222 Other cervical disc displacement at C5-C6 level: Secondary | ICD-10-CM | POA: Diagnosis not present

## 2021-11-01 DIAGNOSIS — M5412 Radiculopathy, cervical region: Secondary | ICD-10-CM

## 2021-11-22 DIAGNOSIS — M25512 Pain in left shoulder: Secondary | ICD-10-CM | POA: Diagnosis not present

## 2021-11-22 DIAGNOSIS — M542 Cervicalgia: Secondary | ICD-10-CM | POA: Diagnosis not present

## 2021-11-23 ENCOUNTER — Other Ambulatory Visit: Payer: Self-pay | Admitting: Interventional Cardiology

## 2021-11-23 DIAGNOSIS — M542 Cervicalgia: Secondary | ICD-10-CM | POA: Diagnosis not present

## 2021-11-23 DIAGNOSIS — M25512 Pain in left shoulder: Secondary | ICD-10-CM | POA: Diagnosis not present

## 2021-11-24 ENCOUNTER — Other Ambulatory Visit (HOSPITAL_COMMUNITY): Payer: Self-pay

## 2021-11-24 MED ORDER — LOSARTAN POTASSIUM 100 MG PO TABS
100.0000 mg | ORAL_TABLET | Freq: Every day | ORAL | 0 refills | Status: DC
  Filled 2021-11-24: qty 30, 30d supply, fill #0

## 2021-11-27 ENCOUNTER — Other Ambulatory Visit (HOSPITAL_COMMUNITY): Payer: Self-pay

## 2021-11-29 ENCOUNTER — Other Ambulatory Visit (HOSPITAL_COMMUNITY): Payer: Self-pay

## 2021-11-29 DIAGNOSIS — M25512 Pain in left shoulder: Secondary | ICD-10-CM | POA: Diagnosis not present

## 2021-11-29 DIAGNOSIS — M542 Cervicalgia: Secondary | ICD-10-CM | POA: Diagnosis not present

## 2021-12-08 DIAGNOSIS — M25562 Pain in left knee: Secondary | ICD-10-CM | POA: Diagnosis not present

## 2021-12-08 DIAGNOSIS — Z96642 Presence of left artificial hip joint: Secondary | ICD-10-CM | POA: Diagnosis not present

## 2021-12-15 ENCOUNTER — Other Ambulatory Visit (HOSPITAL_COMMUNITY): Payer: Self-pay

## 2021-12-15 MED ORDER — CITALOPRAM HYDROBROMIDE 10 MG PO TABS
ORAL_TABLET | Freq: Every day | ORAL | 4 refills | Status: DC
  Filled 2021-12-15: qty 90, 90d supply, fill #0
  Filled 2022-03-08: qty 90, 90d supply, fill #1
  Filled 2022-05-13: qty 90, 90d supply, fill #2
  Filled 2022-07-11 – 2022-07-12 (×2): qty 90, 90d supply, fill #3
  Filled 2022-09-06: qty 90, 90d supply, fill #4

## 2021-12-16 DIAGNOSIS — M25562 Pain in left knee: Secondary | ICD-10-CM | POA: Diagnosis not present

## 2021-12-16 DIAGNOSIS — S82102A Unspecified fracture of upper end of left tibia, initial encounter for closed fracture: Secondary | ICD-10-CM | POA: Diagnosis not present

## 2021-12-17 DIAGNOSIS — S82143A Displaced bicondylar fracture of unspecified tibia, initial encounter for closed fracture: Secondary | ICD-10-CM | POA: Insufficient documentation

## 2021-12-23 DIAGNOSIS — M25562 Pain in left knee: Secondary | ICD-10-CM | POA: Diagnosis not present

## 2021-12-27 ENCOUNTER — Other Ambulatory Visit (HOSPITAL_COMMUNITY): Payer: Self-pay

## 2021-12-27 ENCOUNTER — Other Ambulatory Visit: Payer: Self-pay | Admitting: *Deleted

## 2021-12-27 ENCOUNTER — Other Ambulatory Visit: Payer: Self-pay | Admitting: Interventional Cardiology

## 2021-12-27 MED ORDER — LOSARTAN POTASSIUM 100 MG PO TABS
100.0000 mg | ORAL_TABLET | Freq: Every day | ORAL | 0 refills | Status: DC
  Filled 2021-12-27: qty 15, 15d supply, fill #0

## 2021-12-27 MED ORDER — LOSARTAN POTASSIUM 100 MG PO TABS
ORAL_TABLET | ORAL | 1 refills | Status: DC
  Filled 2021-12-27: qty 90, 90d supply, fill #0
  Filled 2022-03-24: qty 90, 90d supply, fill #1

## 2021-12-31 ENCOUNTER — Other Ambulatory Visit (HOSPITAL_COMMUNITY): Payer: Self-pay

## 2022-01-03 ENCOUNTER — Other Ambulatory Visit (HOSPITAL_COMMUNITY): Payer: Self-pay

## 2022-01-08 ENCOUNTER — Other Ambulatory Visit (HOSPITAL_COMMUNITY): Payer: Self-pay

## 2022-01-08 ENCOUNTER — Other Ambulatory Visit: Payer: Self-pay | Admitting: Interventional Cardiology

## 2022-01-10 ENCOUNTER — Other Ambulatory Visit (HOSPITAL_COMMUNITY): Payer: Self-pay

## 2022-01-10 MED ORDER — DILTIAZEM HCL ER COATED BEADS 180 MG PO CP24
180.0000 mg | ORAL_CAPSULE | Freq: Every day | ORAL | 0 refills | Status: DC
  Filled 2022-01-10: qty 90, 90d supply, fill #0

## 2022-01-12 DIAGNOSIS — N393 Stress incontinence (female) (male): Secondary | ICD-10-CM | POA: Insufficient documentation

## 2022-01-13 ENCOUNTER — Other Ambulatory Visit (HOSPITAL_COMMUNITY): Payer: Self-pay

## 2022-01-13 DIAGNOSIS — R1013 Epigastric pain: Secondary | ICD-10-CM | POA: Diagnosis not present

## 2022-01-13 DIAGNOSIS — K5000 Crohn's disease of small intestine without complications: Secondary | ICD-10-CM | POA: Diagnosis not present

## 2022-01-13 MED ORDER — CLOBETASOL PROPIONATE 0.05 % EX OINT
TOPICAL_OINTMENT | CUTANEOUS | 1 refills | Status: DC
  Filled 2022-01-13: qty 45, 60d supply, fill #0
  Filled 2022-03-02: qty 45, 60d supply, fill #1
  Filled 2022-03-02: qty 15, 20d supply, fill #1
  Filled 2022-03-02: qty 30, 40d supply, fill #1

## 2022-01-14 DIAGNOSIS — S82102A Unspecified fracture of upper end of left tibia, initial encounter for closed fracture: Secondary | ICD-10-CM | POA: Diagnosis not present

## 2022-01-15 ENCOUNTER — Other Ambulatory Visit (HOSPITAL_COMMUNITY): Payer: Self-pay

## 2022-01-21 ENCOUNTER — Other Ambulatory Visit (HOSPITAL_COMMUNITY): Payer: Self-pay

## 2022-01-21 MED ORDER — PEG 3350-KCL-NA BICARB-NACL 420 G PO SOLR
ORAL | 0 refills | Status: DC
  Filled 2022-01-21: qty 4000, 1d supply, fill #0

## 2022-01-26 DIAGNOSIS — K5000 Crohn's disease of small intestine without complications: Secondary | ICD-10-CM | POA: Diagnosis not present

## 2022-01-26 DIAGNOSIS — K5010 Crohn's disease of large intestine without complications: Secondary | ICD-10-CM | POA: Diagnosis not present

## 2022-01-26 DIAGNOSIS — K649 Unspecified hemorrhoids: Secondary | ICD-10-CM | POA: Diagnosis not present

## 2022-01-26 DIAGNOSIS — K5730 Diverticulosis of large intestine without perforation or abscess without bleeding: Secondary | ICD-10-CM | POA: Diagnosis not present

## 2022-01-28 DIAGNOSIS — S82102A Unspecified fracture of upper end of left tibia, initial encounter for closed fracture: Secondary | ICD-10-CM | POA: Diagnosis not present

## 2022-02-16 DIAGNOSIS — M25662 Stiffness of left knee, not elsewhere classified: Secondary | ICD-10-CM | POA: Diagnosis not present

## 2022-02-16 DIAGNOSIS — M25562 Pain in left knee: Secondary | ICD-10-CM | POA: Diagnosis not present

## 2022-02-16 DIAGNOSIS — M6281 Muscle weakness (generalized): Secondary | ICD-10-CM | POA: Diagnosis not present

## 2022-02-16 DIAGNOSIS — R262 Difficulty in walking, not elsewhere classified: Secondary | ICD-10-CM | POA: Diagnosis not present

## 2022-02-17 ENCOUNTER — Other Ambulatory Visit (HOSPITAL_COMMUNITY): Payer: Self-pay

## 2022-02-18 ENCOUNTER — Other Ambulatory Visit (HOSPITAL_COMMUNITY): Payer: Self-pay

## 2022-02-21 ENCOUNTER — Other Ambulatory Visit (HOSPITAL_COMMUNITY): Payer: Self-pay

## 2022-02-21 MED ORDER — VALACYCLOVIR HCL 1 G PO TABS
ORAL_TABLET | ORAL | 3 refills | Status: DC
  Filled 2022-02-21: qty 90, 30d supply, fill #0

## 2022-02-21 MED ORDER — MESALAMINE ER 0.375 G PO CP24
ORAL_CAPSULE | ORAL | 5 refills | Status: DC
  Filled 2022-02-21: qty 120, 30d supply, fill #0
  Filled 2022-03-24: qty 120, 30d supply, fill #1
  Filled 2022-05-02: qty 120, 30d supply, fill #2
  Filled 2022-05-30: qty 120, 30d supply, fill #3
  Filled 2022-06-13: qty 120, 30d supply, fill #4
  Filled 2022-07-11: qty 120, 30d supply, fill #5

## 2022-02-22 ENCOUNTER — Other Ambulatory Visit (HOSPITAL_COMMUNITY): Payer: Self-pay

## 2022-02-23 DIAGNOSIS — M25562 Pain in left knee: Secondary | ICD-10-CM | POA: Diagnosis not present

## 2022-02-23 DIAGNOSIS — M6281 Muscle weakness (generalized): Secondary | ICD-10-CM | POA: Diagnosis not present

## 2022-02-23 DIAGNOSIS — M25662 Stiffness of left knee, not elsewhere classified: Secondary | ICD-10-CM | POA: Diagnosis not present

## 2022-02-23 DIAGNOSIS — R262 Difficulty in walking, not elsewhere classified: Secondary | ICD-10-CM | POA: Diagnosis not present

## 2022-02-25 DIAGNOSIS — M25562 Pain in left knee: Secondary | ICD-10-CM | POA: Diagnosis not present

## 2022-02-28 DIAGNOSIS — M25662 Stiffness of left knee, not elsewhere classified: Secondary | ICD-10-CM | POA: Diagnosis not present

## 2022-02-28 DIAGNOSIS — M6281 Muscle weakness (generalized): Secondary | ICD-10-CM | POA: Diagnosis not present

## 2022-02-28 DIAGNOSIS — M25562 Pain in left knee: Secondary | ICD-10-CM | POA: Diagnosis not present

## 2022-02-28 DIAGNOSIS — R262 Difficulty in walking, not elsewhere classified: Secondary | ICD-10-CM | POA: Diagnosis not present

## 2022-03-02 ENCOUNTER — Other Ambulatory Visit (HOSPITAL_COMMUNITY): Payer: Self-pay

## 2022-03-02 DIAGNOSIS — M25662 Stiffness of left knee, not elsewhere classified: Secondary | ICD-10-CM | POA: Diagnosis not present

## 2022-03-02 DIAGNOSIS — M25562 Pain in left knee: Secondary | ICD-10-CM | POA: Diagnosis not present

## 2022-03-02 DIAGNOSIS — R262 Difficulty in walking, not elsewhere classified: Secondary | ICD-10-CM | POA: Diagnosis not present

## 2022-03-02 DIAGNOSIS — M6281 Muscle weakness (generalized): Secondary | ICD-10-CM | POA: Diagnosis not present

## 2022-03-08 ENCOUNTER — Other Ambulatory Visit (HOSPITAL_COMMUNITY): Payer: Self-pay

## 2022-03-08 DIAGNOSIS — R262 Difficulty in walking, not elsewhere classified: Secondary | ICD-10-CM | POA: Diagnosis not present

## 2022-03-08 DIAGNOSIS — M25662 Stiffness of left knee, not elsewhere classified: Secondary | ICD-10-CM | POA: Diagnosis not present

## 2022-03-08 DIAGNOSIS — M25562 Pain in left knee: Secondary | ICD-10-CM | POA: Diagnosis not present

## 2022-03-08 DIAGNOSIS — M6281 Muscle weakness (generalized): Secondary | ICD-10-CM | POA: Diagnosis not present

## 2022-03-10 DIAGNOSIS — M6281 Muscle weakness (generalized): Secondary | ICD-10-CM | POA: Diagnosis not present

## 2022-03-10 DIAGNOSIS — M25662 Stiffness of left knee, not elsewhere classified: Secondary | ICD-10-CM | POA: Diagnosis not present

## 2022-03-10 DIAGNOSIS — R262 Difficulty in walking, not elsewhere classified: Secondary | ICD-10-CM | POA: Diagnosis not present

## 2022-03-10 DIAGNOSIS — M25562 Pain in left knee: Secondary | ICD-10-CM | POA: Diagnosis not present

## 2022-03-14 DIAGNOSIS — R262 Difficulty in walking, not elsewhere classified: Secondary | ICD-10-CM | POA: Diagnosis not present

## 2022-03-14 DIAGNOSIS — M6281 Muscle weakness (generalized): Secondary | ICD-10-CM | POA: Diagnosis not present

## 2022-03-14 DIAGNOSIS — M25562 Pain in left knee: Secondary | ICD-10-CM | POA: Diagnosis not present

## 2022-03-14 DIAGNOSIS — M25662 Stiffness of left knee, not elsewhere classified: Secondary | ICD-10-CM | POA: Diagnosis not present

## 2022-03-16 DIAGNOSIS — M25662 Stiffness of left knee, not elsewhere classified: Secondary | ICD-10-CM | POA: Diagnosis not present

## 2022-03-16 DIAGNOSIS — M6281 Muscle weakness (generalized): Secondary | ICD-10-CM | POA: Diagnosis not present

## 2022-03-16 DIAGNOSIS — R262 Difficulty in walking, not elsewhere classified: Secondary | ICD-10-CM | POA: Diagnosis not present

## 2022-03-16 DIAGNOSIS — M25562 Pain in left knee: Secondary | ICD-10-CM | POA: Diagnosis not present

## 2022-03-19 ENCOUNTER — Other Ambulatory Visit (HOSPITAL_COMMUNITY): Payer: Self-pay

## 2022-03-23 DIAGNOSIS — R262 Difficulty in walking, not elsewhere classified: Secondary | ICD-10-CM | POA: Diagnosis not present

## 2022-03-23 DIAGNOSIS — M25562 Pain in left knee: Secondary | ICD-10-CM | POA: Diagnosis not present

## 2022-03-23 DIAGNOSIS — M6281 Muscle weakness (generalized): Secondary | ICD-10-CM | POA: Diagnosis not present

## 2022-03-23 DIAGNOSIS — M25662 Stiffness of left knee, not elsewhere classified: Secondary | ICD-10-CM | POA: Diagnosis not present

## 2022-03-24 ENCOUNTER — Other Ambulatory Visit (HOSPITAL_COMMUNITY): Payer: Self-pay

## 2022-03-24 MED ORDER — ALPRAZOLAM 0.5 MG PO TABS
ORAL_TABLET | ORAL | 0 refills | Status: AC
  Filled 2022-03-24: qty 270, 90d supply, fill #0

## 2022-03-25 ENCOUNTER — Other Ambulatory Visit (HOSPITAL_COMMUNITY): Payer: Self-pay

## 2022-03-25 DIAGNOSIS — M25562 Pain in left knee: Secondary | ICD-10-CM | POA: Diagnosis not present

## 2022-03-25 DIAGNOSIS — M6281 Muscle weakness (generalized): Secondary | ICD-10-CM | POA: Diagnosis not present

## 2022-03-25 DIAGNOSIS — R262 Difficulty in walking, not elsewhere classified: Secondary | ICD-10-CM | POA: Diagnosis not present

## 2022-03-25 DIAGNOSIS — M25662 Stiffness of left knee, not elsewhere classified: Secondary | ICD-10-CM | POA: Diagnosis not present

## 2022-03-26 ENCOUNTER — Other Ambulatory Visit (HOSPITAL_COMMUNITY): Payer: Self-pay

## 2022-03-28 DIAGNOSIS — R262 Difficulty in walking, not elsewhere classified: Secondary | ICD-10-CM | POA: Diagnosis not present

## 2022-03-28 DIAGNOSIS — M25562 Pain in left knee: Secondary | ICD-10-CM | POA: Diagnosis not present

## 2022-03-28 DIAGNOSIS — M6281 Muscle weakness (generalized): Secondary | ICD-10-CM | POA: Diagnosis not present

## 2022-03-28 DIAGNOSIS — M25662 Stiffness of left knee, not elsewhere classified: Secondary | ICD-10-CM | POA: Diagnosis not present

## 2022-03-30 DIAGNOSIS — R262 Difficulty in walking, not elsewhere classified: Secondary | ICD-10-CM | POA: Diagnosis not present

## 2022-03-30 DIAGNOSIS — M25562 Pain in left knee: Secondary | ICD-10-CM | POA: Diagnosis not present

## 2022-03-30 DIAGNOSIS — M6281 Muscle weakness (generalized): Secondary | ICD-10-CM | POA: Diagnosis not present

## 2022-03-30 DIAGNOSIS — M25662 Stiffness of left knee, not elsewhere classified: Secondary | ICD-10-CM | POA: Diagnosis not present

## 2022-03-31 DIAGNOSIS — S82102A Unspecified fracture of upper end of left tibia, initial encounter for closed fracture: Secondary | ICD-10-CM | POA: Diagnosis not present

## 2022-04-05 DIAGNOSIS — M25562 Pain in left knee: Secondary | ICD-10-CM | POA: Diagnosis not present

## 2022-04-05 DIAGNOSIS — M25662 Stiffness of left knee, not elsewhere classified: Secondary | ICD-10-CM | POA: Diagnosis not present

## 2022-04-05 DIAGNOSIS — M6281 Muscle weakness (generalized): Secondary | ICD-10-CM | POA: Diagnosis not present

## 2022-04-05 DIAGNOSIS — R262 Difficulty in walking, not elsewhere classified: Secondary | ICD-10-CM | POA: Diagnosis not present

## 2022-04-05 NOTE — Progress Notes (Unsigned)
Cardiology Office Note:    Date:  04/07/2022   ID:  Nicole [name], DOB 02-21-1956, MRN [id number]  PCP:  Nicole Arabian, MD  Cardiologist:  Nicole Grooms, MD   Referring MD: Nicole Arabian, MD   Chief Complaint  Patient presents with   Follow-up    Aortic atherosclerosis Hypertension    History of Present Illness:    Nicole [name] is a 66 y.o. female with a hx of primary hypertension and asthma.  No symptoms.  Has been sedentary because of a tibial plateau fracture.  Pelvic CT done for other reasons identified aortic atherosclerosis.  Her last lipid panel was a year and a half ago and LDL was 95.  She has no anginal complaints or claudication.  She is not diabetic.  Past Medical History:  Diagnosis Date   Anxiety    Arthritis    lumbar- HNP, /w myelopathy   Asthma    /w reflux , consult /w Dr. Wert-2013 / Jodi Mourning INHALER PRN INFREQUENTLY   Bronchitis    used albuterol , Sept. 2013, all clear now    Complication of anesthesia    Difficulty sleeping    DUE TO PAIN   Family history of anesthesia complication    N&V- Mother    GERD (gastroesophageal reflux disease)    Chron's disease, not medically treating currently    H/O hiatal hernia    small hiatal hernia, has had endoscopy & colondoscopy   H/O vertigo    Heart murmur    since birth   Hypertension    followed by Dr. Marisue Humble    Numbness in left leg    PONV (postoperative nausea and vomiting)    Stress incontinence     Past Surgical History:  Procedure Laterality Date   BACK SURGERY  2013   CHOLECYSTECTOMY  2   laparoscopic   LUMBAR LAMINECTOMY/DECOMPRESSION MICRODISCECTOMY  09/10/2012   Procedure: LUMBAR LAMINECTOMY/DECOMPRESSION MICRODISCECTOMY 1 LEVEL;  Surgeon: Kristeen Miss, MD;  Location: [facility];  Service: Neurosurgery;  Laterality: Left;  Left Lumbar Five-Sacral One Microdiscectomy   TOTAL HIP ARTHROPLASTY Left 12/24/2014   Procedure: LEFT TOTAL HIP ARTHROPLASTY ANTERIOR APPROACH;   Surgeon: Mauri Pole, MD;  Location: [facility];  Service: Orthopedics;  Laterality: Left;    Current Medications: Current Meds  Medication Sig   albuterol (VENTOLIN HFA) 108 (90 Base) MCG/ACT inhaler INHALE 2 PUFFS INTO THE LUNGS EVERY 6 HOURS AS NEEDED FOR WHEEZING OR SHORTNESS OF BREATH * NEEDS APPT   ALPRAZolam (XANAX) 0.5 MG tablet Take 0.5 mg by mouth as needed for anxiety or sleep.    ALPRAZolam (XANAX) 0.5 MG tablet Take 1 tablet by mouth 3 times daily as needed (must last 90 days).   betamethasone dipropionate 0.05 % cream Apply to affected area 2 (two) times daily.   citalopram (CELEXA) 10 MG tablet Take 1 tablet by mouth everyday.   clobetasol ointment (TEMOVATE) 0.05 % Apply an eraser-sized amount to vaginal areas of concern nightly for two weeks and then twice a week thereafter.   diltiazem (CARDIZEM CD) 180 MG 24 hr capsule Take 1 capsule (180 mg total) by mouth daily. Please keep upcoming appt in May 2023 with Dr. Tamala Julian before anymore refills. Thank you Final Attempt   esomeprazole (NEXIUM) 40 MG capsule Take 40 mg by mouth 2 (two) times daily before a meal.    fluticasone (FLONASE) 50 MCG/ACT nasal spray Place 2 sprays into both nostrils daily as needed (For cold symptoms).  mesalamine (APRISO) 0.375 g 24 hr capsule Take 4 capsules by mouth every morning   mupirocin ointment (BACTROBAN) 2 % Apply a  small amount to affected area once a day with bandage changes   polyethylene glycol-electrolytes (NULYTELY) 420 g solution Use as directed   valACYclovir (VALTREX) 1000 MG tablet TAKE 2 TABLETS BY MOUTH 2 TIMES DAILY FOR 2 DAYS AT FIRST SIGN OF FEVER BLISTER   [DISCONTINUED] losartan (COZAAR) 100 MG tablet Take 1 tablet by mouth daily.     Allergies:   Hctz [hydrochlorothiazide], Hydralazine hcl, Metoclopramide hcl, Other, Tramadol, Voltaren [diclofenac sodium], Bacitracin, Bacitracin-polymyxin b, Diclofenac sodium, Gabapentin, Polymyxin b, Avelox [moxifloxacin hcl in nacl],  Clindamycin, Clindamycin/lincomycin, Flagyl [metronidazole], Moxifloxacin, Neosporin [neomycin-bacitracin zn-polymyx], and Pristiq [desvenlafaxine succinate er]   Social History   Socioeconomic History   Marital status: Divorced    Spouse name: Not on file   Number of children: 0   Years of education: 16   Highest education level: Not on file  Occupational History   Occupation: RN  Tobacco Use   Smoking status: Former    Packs/day: 1.50    Years: 27.00    Pack years: 40.50    Types: Cigarettes    Quit date: 11/15/1999    Years since quitting: 22.4   Smokeless tobacco: Never  Vaping Use   Vaping Use: Never used  Substance and Sexual Activity   Alcohol use: Yes    Comment: Seldom drinks, but occasional wine.   Drug use: No   Sexual activity: Not on file  Other Topics Concern   Not on file  Social History Narrative   Lives with mom and dad in a one story home.  Has no children.  Works as a travel Marine scientist.  Education: college.   Social Determinants of Health   Financial Resource Strain: Not on file  Food Insecurity: Not on file  Transportation Needs: Not on file  Physical Activity: Not on file  Stress: Not on file  Social Connections: Not on file     Family History: The patient's family history includes Asthma in her mother; Diabetes in her father; Glaucoma in her mother; Hypertension in her father and sister; Kidney disease in her father and mother; Stroke in her father; Thyroid disease in her mother.  ROS:   Please see the history of present illness.    Exertional intolerance.  Recent tibial plateau fracture.  Hyperlipidemia with triglyceride 248 and LDL cholesterol 95.  Total cholesterol 195 in January 2022.  All other systems reviewed and are negative.  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 No evidence of  active Crohn's disease or other acute findings.   Stable benign appearing left adnexal cystic lesion compared to previous studies dating back to 2013. No further imaging follow-up recommended.   Aortic Atherosclerosis (ICD10-I70.0).bdominal CT scan September 2022:   EKG:  EKG sinus bradycardia, 51 bpm.  EKG is otherwise normal.  In comparison to January 2022, rate is significantly slower.  Recent Labs: No results found for requested labs within last 8760 hours.  Recent Lipid Panel No results found for: CHOL, TRIG, HDL, CHOLHDL, VLDL, LDLCALC, LDLDIRECT  Physical Exam:    VS:  BP 126/74   Pulse (!) 51   Ht '5\' 6"'$  (1.676 m)   Wt 187 lb (84.8 kg)   SpO2 96%   BMI 30.18 kg/m  Wt Readings from Last 3 Encounters:  04/07/22 187 lb (84.8 kg)  05/31/21 190 lb 9.6 oz (86.5 kg)  12/14/20 187 lb (84.8 kg)     GEN: Overweight. No acute distress HEENT: Normal NECK: No JVD. LYMPHATICS: No lymphadenopathy CARDIAC: no murmur. RRR no gallop, or edema. VASCULAR:  Normal Pulses. No bruits. RESPIRATORY:  Clear to auscultation without rales, wheezing or rhonchi  ABDOMEN: Soft, non-tender, non-distended, No pulsatile mass, MUSCULOSKELETAL: No deformity  SKIN: Warm and dry NEUROLOGIC:  Alert and oriented x 3 PSYCHIATRIC:  Normal affect   ASSESSMENT:    1. Essential hypertension   2. Atherosclerosis of aorta (HCC)   3. Class 1 obesity due to excess calories without serious comorbidity with body mass index (BMI) of 30.0 to 30.9 in adult    PLAN:    In order of problems listed above:  Target 130/80 mmHg in 66 years of age stent 140/80 mmHg low-salt diet.  Continue diltiazem 180 mg/day and Cozaar 100 mg/day. Needs LDL lowering and management of triglycerides.  She has antitherapy for lipids.  She wants to attempt diet to lower the LDL to at least 70.  This is not likely to be possible but we will go along with the current plan.  Nerve prevention discussed.  Aspirin coated, 81 mg Monday  Wednesday and Friday at a minimum.  I recommended daily therapy. Exercise and weight loss encouraged.  Overall education and awareness concerning secondary risk prevention was discussed in detail: LDL less than 70, hemoglobin A1c less than 7, blood pressure target less than 130/80 mmHg, >150 minutes of moderate aerobic activity per week, avoidance of smoking, weight control (via diet and exercise), and continued surveillance/management of/for obstructive sleep apnea.     Medication Adjustments/Labs and Tests Ordered: Current medicines are reviewed at length with the patient today.  Concerns regarding medicines are outlined above.  Orders Placed This Encounter  Procedures   EKG 12-Lead   No orders of the defined types were placed in this encounter.   Patient Instructions  Medication Instructions:  Your physician recommends that you continue on your current medications as directed. Please refer to the Current Medication list given to you today.  *If you need a refill on your cardiac medications before your next appointment, please call your pharmacy*  Lab Work: Please have a fasting Lipid panel drawn by one of your medical providers in September.  Testing/Procedures: NONE  Follow-Up: At [facility], you and your health needs are our priority.  As part of our continuing mission to provide you with exceptional heart care, we have created designated Provider Care Teams.  These Care Teams include your primary Cardiologist (physician) and Advanced Practice Providers (APPs -  Physician Assistants and Nurse Practitioners) who all work together to provide you with the care you need, when you need it.  Your next appointment:   1 year(s)  The format for your next appointment:   In Person  Provider:   Sinclair Grooms, MD {  Other Instructions For you cholesterol, your LDL goal is less than 70. Please continue with diet to lower cholesterol and have a lipid panel checked by one of  your medical providers in September.  Preventing High Cholesterol Cholesterol is a white, waxy substance similar to fat that the human body needs to help build cells. The liver makes all the cholesterol that a person's body needs. Having high cholesterol (hypercholesterolemia) increases your risk for heart disease and stroke. Extra or excess cholesterol comes from  the food that you eat. High cholesterol can often be prevented with diet and lifestyle changes. If you already have high cholesterol, you can control it with diet, lifestyle changes, and medicines. How can high cholesterol affect me? If you have high cholesterol, fatty deposits (plaques) may build up on the walls of your blood vessels. The blood vessels that carry blood away from your heart are called arteries. Plaques make the arteries narrower and stiffer. This in turn can: Restrict or block blood flow and cause blood clots to form. Increase your risk for heart attack and stroke. What can increase my risk for high cholesterol? This condition is more likely to develop in people who: Eat foods that are high in saturated fat or cholesterol. Saturated fat is mostly found in foods that come from animal sources. Are overweight. Are not getting enough exercise. Use products that contain nicotine or tobacco, such as cigarettes, e-cigarettes, and chewing tobacco. Have a family history of high cholesterol (familial hypercholesterolemia). What actions can I take to prevent this? Nutrition  Eat less saturated fat. Avoid trans fats (partially hydrogenated oils). These are often found in margarine and in some baked goods, fried foods, and snacks bought in packages. Avoid precooked or cured meat, such as bacon, sausages, or meat loaves. Avoid foods and drinks that have added sugars. Eat more fruits, vegetables, and whole grains. Choose healthy sources of protein, such as fish, poultry, lean cuts of red meat, beans, peas, lentils, and  nuts. Choose healthy sources of fat, such as: Nuts. Vegetable oils, especially olive oil. Fish that have healthy fats, such as omega-3 fatty acids. These fish include mackerel or salmon. Lifestyle Lose weight if you are overweight. Maintaining a healthy body mass index (BMI) can help prevent or control high cholesterol. It can also lower your risk for diabetes and high blood pressure. Ask your health care provider to help you with a diet and exercise plan to lose weight safely. Do not use any products that contain nicotine or tobacco. These products include cigarettes, chewing tobacco, and vaping devices, such as e-cigarettes. If you need help quitting, ask your health care provider. Alcohol use Do not drink alcohol if: Your health care provider tells you not to drink. You are pregnant, may be pregnant, or are planning to become pregnant. If you drink alcohol: Limit how much you have to: 0-1 drink a day for women. 0-2 drinks a day for men. Know how much alcohol is in your drink. In the [place] one drink equals one 12 oz bottle of beer (355 mL), one 5 oz glass of wine (148 mL), or one 1 oz glass of hard liquor (44 mL). Activity  Get enough exercise. Do exercises as told by your health care provider. Each week, do at least 150 minutes of exercise that takes a medium level of effort (moderate-intensity exercise). This kind of exercise: Makes your heart beat faster while allowing you to still be able to talk. Can be done in short sessions several times a day or longer sessions a few times a week. For example, on 5 days each week, you could walk fast or ride your bike 3 times a day for 10 minutes each time. Medicines Your health care provider may recommend medicines to help lower cholesterol. This may be a medicine to lower the amount of cholesterol that your liver makes. You may need medicine if: Diet and lifestyle changes have not lowered your cholesterol enough. You have high cholesterol and  other risk factors for  heart disease or stroke. Take over-the-counter and prescription medicines only as told by your health care provider. General information Manage your risk factors for high cholesterol. Talk with your health care provider about all your risk factors and how to lower your risk. Manage other conditions that you have, such as diabetes or high blood pressure (hypertension). Have blood tests to check your cholesterol levels at regular points in time as told by your health care provider. Keep all follow-up visits. This is important. Where to find more information American Heart Association: www.heart.org National Heart, Lung, and Blood Institute: https://wilson-eaton.com/ Summary High cholesterol increases your risk for heart disease and stroke. By keeping your cholesterol level low, you can reduce your risk for these conditions. High cholesterol can often be prevented with diet and lifestyle changes. Work with your health care provider to manage your risk factors, and have your blood tested regularly. This information is not intended to replace advice given to you by your health care provider. Make sure you discuss any questions you have with your health care provider. Document Revised: 01/04/2021 Document Reviewed: 01/04/2021 Nicole Patient Education  [name]         Signed, Nicole Grooms, MD  04/07/2022 12:06 PM    [place]

## 2022-04-06 ENCOUNTER — Other Ambulatory Visit: Payer: Self-pay | Admitting: Interventional Cardiology

## 2022-04-07 ENCOUNTER — Encounter: Payer: Self-pay | Admitting: Interventional Cardiology

## 2022-04-07 ENCOUNTER — Other Ambulatory Visit (HOSPITAL_COMMUNITY): Payer: Self-pay

## 2022-04-07 ENCOUNTER — Ambulatory Visit: Payer: Medicare Other | Admitting: Interventional Cardiology

## 2022-04-07 VITALS — BP 126/74 | HR 51 | Ht 66.0 in | Wt 187.0 lb

## 2022-04-07 DIAGNOSIS — M6281 Muscle weakness (generalized): Secondary | ICD-10-CM | POA: Diagnosis not present

## 2022-04-07 DIAGNOSIS — I700 Atherosclerosis of aorta: Secondary | ICD-10-CM

## 2022-04-07 DIAGNOSIS — I10 Essential (primary) hypertension: Secondary | ICD-10-CM | POA: Diagnosis not present

## 2022-04-07 DIAGNOSIS — M25662 Stiffness of left knee, not elsewhere classified: Secondary | ICD-10-CM | POA: Diagnosis not present

## 2022-04-07 DIAGNOSIS — E6609 Other obesity due to excess calories: Secondary | ICD-10-CM | POA: Diagnosis not present

## 2022-04-07 DIAGNOSIS — R262 Difficulty in walking, not elsewhere classified: Secondary | ICD-10-CM | POA: Diagnosis not present

## 2022-04-07 DIAGNOSIS — M25562 Pain in left knee: Secondary | ICD-10-CM | POA: Diagnosis not present

## 2022-04-07 DIAGNOSIS — Z6830 Body mass index (BMI) 30.0-30.9, adult: Secondary | ICD-10-CM | POA: Diagnosis not present

## 2022-04-07 MED ORDER — LOSARTAN POTASSIUM 100 MG PO TABS
ORAL_TABLET | ORAL | 3 refills | Status: DC
  Filled 2022-04-07: qty 90, 90d supply, fill #0
  Filled 2022-04-07: qty 90, fill #0
  Filled 2022-07-11: qty 90, 90d supply, fill #0
  Filled 2022-10-11: qty 90, 90d supply, fill #1
  Filled 2023-01-02: qty 90, 90d supply, fill #2

## 2022-04-07 NOTE — Patient Instructions (Signed)
Medication Instructions:  Your physician recommends that you continue on your current medications as directed. Please refer to the Current Medication list given to you today.  *If you need a refill on your cardiac medications before your next appointment, please call your pharmacy*  Lab Work: Please have a fasting Lipid panel drawn by one of your medical providers in September.  Testing/Procedures: NONE  Follow-Up: At [facility], you and your health needs are our priority.  As part of our continuing mission to provide you with exceptional heart care, we have created designated Provider Care Teams.  These Care Teams include your primary Cardiologist (physician) and Advanced Practice Providers (APPs -  Physician Assistants and Nurse Practitioners) who all work together to provide you with the care you need, when you need it.  Your next appointment:   1 year(s)  The format for your next appointment:   In Person  Provider:   Sinclair Grooms, MD {  Other Instructions For you cholesterol, your LDL goal is less than 70. Please continue with diet to lower cholesterol and have a lipid panel checked by one of your medical providers in September.  Preventing High Cholesterol Cholesterol is a white, waxy substance similar to fat that the human body needs to help build cells. The liver makes all the cholesterol that a person's body needs. Having high cholesterol (hypercholesterolemia) increases your risk for heart disease and stroke. Extra or excess cholesterol comes from the food that you eat. High cholesterol can often be prevented with diet and lifestyle changes. If you already have high cholesterol, you can control it with diet, lifestyle changes, and medicines. How can high cholesterol affect me? If you have high cholesterol, fatty deposits (plaques) may build up on the walls of your blood vessels. The blood vessels that carry blood away from your heart are called arteries. Plaques make  the arteries narrower and stiffer. This in turn can: Restrict or block blood flow and cause blood clots to form. Increase your risk for heart attack and stroke. What can increase my risk for high cholesterol? This condition is more likely to develop in people who: Eat foods that are high in saturated fat or cholesterol. Saturated fat is mostly found in foods that come from animal sources. Are overweight. Are not getting enough exercise. Use products that contain nicotine or tobacco, such as cigarettes, e-cigarettes, and chewing tobacco. Have a family history of high cholesterol (familial hypercholesterolemia). What actions can I take to prevent this? Nutrition  Eat less saturated fat. Avoid trans fats (partially hydrogenated oils). These are often found in margarine and in some baked goods, fried foods, and snacks bought in packages. Avoid precooked or cured meat, such as bacon, sausages, or meat loaves. Avoid foods and drinks that have added sugars. Eat more fruits, vegetables, and whole grains. Choose healthy sources of protein, such as fish, poultry, lean cuts of red meat, beans, peas, lentils, and nuts. Choose healthy sources of fat, such as: Nuts. Vegetable oils, especially olive oil. Fish that have healthy fats, such as omega-3 fatty acids. These fish include mackerel or salmon. Lifestyle Lose weight if you are overweight. Maintaining a healthy body mass index (BMI) can help prevent or control high cholesterol. It can also lower your risk for diabetes and high blood pressure. Ask your health care provider to help you with a diet and exercise plan to lose weight safely. Do not use any products that contain nicotine or tobacco. These products include cigarettes, chewing tobacco,  and vaping devices, such as e-cigarettes. If you need help quitting, ask your health care provider. Alcohol use Do not drink alcohol if: Your health care provider tells you not to drink. You are pregnant, may  be pregnant, or are planning to become pregnant. If you drink alcohol: Limit how much you have to: 0-1 drink a day for women. 0-2 drinks a day for men. Know how much alcohol is in your drink. In the [place] one drink equals one 12 oz bottle of beer (355 mL), one 5 oz glass of wine (148 mL), or one 1 oz glass of hard liquor (44 mL). Activity  Get enough exercise. Do exercises as told by your health care provider. Each week, do at least 150 minutes of exercise that takes a medium level of effort (moderate-intensity exercise). This kind of exercise: Makes your heart beat faster while allowing you to still be able to talk. Can be done in short sessions several times a day or longer sessions a few times a week. For example, on 5 days each week, you could walk fast or ride your bike 3 times a day for 10 minutes each time. Medicines Your health care provider may recommend medicines to help lower cholesterol. This may be a medicine to lower the amount of cholesterol that your liver makes. You may need medicine if: Diet and lifestyle changes have not lowered your cholesterol enough. You have high cholesterol and other risk factors for heart disease or stroke. Take over-the-counter and prescription medicines only as told by your health care provider. General information Manage your risk factors for high cholesterol. Talk with your health care provider about all your risk factors and how to lower your risk. Manage other conditions that you have, such as diabetes or high blood pressure (hypertension). Have blood tests to check your cholesterol levels at regular points in time as told by your health care provider. Keep all follow-up visits. This is important. Where to find more information American Heart Association: www.heart.org National Heart, Lung, and Blood Institute: https://wilson-eaton.com/ Summary High cholesterol increases your risk for heart disease and stroke. By keeping your cholesterol level low,  you can reduce your risk for these conditions. High cholesterol can often be prevented with diet and lifestyle changes. Work with your health care provider to manage your risk factors, and have your blood tested regularly. This information is not intended to replace advice given to you by your health care provider. Make sure you discuss any questions you have with your health care provider. Document Revised: 01/04/2021 Document Reviewed: 01/04/2021 Elsevier Patient Education  [place]

## 2022-04-08 ENCOUNTER — Other Ambulatory Visit (HOSPITAL_COMMUNITY): Payer: Self-pay

## 2022-04-08 MED ORDER — METHOCARBAMOL 500 MG PO TABS
500.0000 mg | ORAL_TABLET | Freq: Three times a day (TID) | ORAL | 0 refills | Status: DC | PRN
  Filled 2022-04-08: qty 30, 10d supply, fill #0

## 2022-04-12 ENCOUNTER — Other Ambulatory Visit (HOSPITAL_COMMUNITY): Payer: Self-pay

## 2022-04-14 ENCOUNTER — Other Ambulatory Visit (HOSPITAL_COMMUNITY): Payer: Self-pay

## 2022-04-17 ENCOUNTER — Other Ambulatory Visit: Payer: Self-pay | Admitting: Interventional Cardiology

## 2022-04-18 ENCOUNTER — Other Ambulatory Visit (HOSPITAL_COMMUNITY): Payer: Self-pay

## 2022-04-18 MED ORDER — DILTIAZEM HCL ER COATED BEADS 180 MG PO CP24
180.0000 mg | ORAL_CAPSULE | Freq: Every day | ORAL | 3 refills | Status: DC
  Filled 2022-04-18: qty 90, 90d supply, fill #0
  Filled 2022-07-11: qty 90, 90d supply, fill #1
  Filled 2022-10-11: qty 90, 90d supply, fill #2
  Filled 2023-01-11: qty 90, 90d supply, fill #3
  Filled 2023-01-11: qty 90, 90d supply, fill #0
  Filled 2023-01-11: qty 90, 90d supply, fill #3

## 2022-04-22 DIAGNOSIS — M6281 Muscle weakness (generalized): Secondary | ICD-10-CM | POA: Diagnosis not present

## 2022-04-22 DIAGNOSIS — M25562 Pain in left knee: Secondary | ICD-10-CM | POA: Diagnosis not present

## 2022-04-22 DIAGNOSIS — R262 Difficulty in walking, not elsewhere classified: Secondary | ICD-10-CM | POA: Diagnosis not present

## 2022-04-22 DIAGNOSIS — M25662 Stiffness of left knee, not elsewhere classified: Secondary | ICD-10-CM | POA: Diagnosis not present

## 2022-04-25 ENCOUNTER — Encounter: Payer: Self-pay | Admitting: *Deleted

## 2022-04-25 ENCOUNTER — Encounter: Payer: Self-pay | Admitting: Primary Care

## 2022-04-25 ENCOUNTER — Other Ambulatory Visit (HOSPITAL_COMMUNITY): Payer: Self-pay

## 2022-04-25 ENCOUNTER — Ambulatory Visit: Payer: Medicare Other | Admitting: Primary Care

## 2022-04-25 VITALS — BP 118/70 | HR 72 | Temp 98.3°F | Ht 66.0 in | Wt 187.0 lb

## 2022-04-25 DIAGNOSIS — J45909 Unspecified asthma, uncomplicated: Secondary | ICD-10-CM

## 2022-04-25 LAB — NITRIC OXIDE: Nitric Oxide: 45

## 2022-04-25 MED ORDER — PREDNISONE 10 MG PO TABS
ORAL_TABLET | ORAL | 0 refills | Status: DC
Start: 2022-04-25 — End: 2022-12-12
  Filled 2022-04-25: qty 20, 8d supply, fill #0

## 2022-04-25 MED ORDER — METHYLPREDNISOLONE ACETATE 80 MG/ML IJ SUSP
80.0000 mg | Freq: Once | INTRAMUSCULAR | Status: AC
  Administered 2022-04-25: 80 mg via INTRAMUSCULAR

## 2022-04-25 MED ORDER — AZITHROMYCIN 250 MG PO TABS
ORAL_TABLET | ORAL | 0 refills | Status: DC
Start: 2022-04-25 — End: 2022-12-12
  Filled 2022-04-25: qty 6, 5d supply, fill #0

## 2022-04-25 NOTE — Assessment & Plan Note (Signed)
-  Seen today for acute asthmatic bronchitis.  Patient developed cough with associated wheezing 2 to 3 days ago.  Lungs were clear on exam.  FENO was 45.  She received Depo-Medrol 80 mg IM x1 in office.  Sending in prescription for prednisone taper 40 mg x 2 days, 30 mg x 2 days, 20 mg x 2 days, 10 mg x 2 days.  Z-Pak to have on hold if cough does not improve or sputum becomes more purulent.  Advise she take Mucinex 600 mg twice daily for the next 7 to 10 days.  She needs to check home COVID swab between 6/12 and 6/14.  She was provided a note to be out of work until June 14.  Follow-up as needed if symptoms do not improve or worsen

## 2022-04-25 NOTE — Assessment & Plan Note (Addendum)
-   Symptoms have been well controlled the last year without any SABA use. Her symptoms typically only flare when acutely ill. Continue Albuterol HFA 2 puffs every 4-6 hour as needed for breakthrough sob/wheeing. FU in 1 year or sooner if needed.

## 2022-04-25 NOTE — Patient Instructions (Addendum)
Recommendations: Start Mucinex '600mg'$  twice daily x 7-10 days  COVID test between Monday and Wednesday, call if positive We will provide you a letter to be out of work until Wednesday, June 14  Office treatment: Depo-medrol '80mg'$  IM x 1 today  Rx: Prednisone taper '40mg'$  x 2 days; '30mg'$ x 2 days, '20mg'$  x 2 days; '10mg'$  x 2 days Zpack to take if needed   Follow-up: If symptoms do not improve

## 2022-04-25 NOTE — Progress Notes (Signed)
$'@Patient'M$  ID: Unknown Foley, female    DOB: [birth year], 66 y.o.   MRN: [id number]  Chief Complaint  Patient presents with   Acute Visit    Referring provider: Gaynelle Arabian, MD  HPI: 66 year old female, former smoker. PMH significant for HTN, intrinsic asthma, irritable larynx, cough,  PND, obesity. Patient of Dr. Lamonte Sakai.  Previous [facility] pulmonary: 05/31/2021 Patient presents today for PFTs. During last visit she had a persistent cough with white mucus in the morning, this cleared as the day went on. Occasional chest tightness that did respond to albuterol. Cough was felt to be more upper airway in nature, however, has responded to prednisone in the past temporarily. She was advised not to re-start Breo.  She was started on Flonase nasal spray. Continue albuterol hfa as needed, Nexium '40mg'$  BID. Ordered for PFTs to assess of obstruction.   She is doing well today. Her cough has resolved. She has no acute complaints today. She has rare nasal congestion but denies post nasal drip. She uses saline rinse once daily. She works in [place]. She states that fire/smoke and strong smells bother her. She is not currently on maintenance inhaler. She has not used albuterol hfa more than once in the past year. When she gets bronchitis symptoms she needs typically needs steroid injection and oral prednisone taper. She has not had bronchitis symptoms since covid started and we began wearing masks. 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 Her goal weight is 145lbs. She will be starting celexa '10mg'$  today, she has an apt with psychiatry tomorrow.  04/25/2022- Interim hx  Patient presents today for acute office visit due to wheezing.  History of intrinsic asthma.  Maintained on as needed albuterol and Nexium 40 mg twice daily.  She developed scartatchy thraot on Friday with associated wheezing. She used her albuterol. She has had to ue 4 times a day d/t shortness of  breath. She is getting up clear-white mucus, turned yellow this moring.     Pulmonary function testing: 05/31/2021 FVC 3.03 (88%), FEV1 2.05 (78%), ratio 68, TLC 95%, DLCOunc 22.15 (104%)  Moderate obstruction with positive BS response. Normal diffusion capacity.   Allergies  Allergen Reactions   Hctz [Hydrochlorothiazide] Other (See Comments)    MUSCLE CRAMPING   Hydralazine Hcl Other (See Comments)    Cramping   Metoclopramide Hcl Other (See Comments)    Cramping, anxiety and depression; "makes me crazy"   Other Other (See Comments)    Cramping   Tramadol Other (See Comments)    "wig out"   Voltaren [Diclofenac Sodium] Other (See Comments)    Swelling in leg and became parched.    Bacitracin    Bacitracin-Polymyxin B     Other reaction(s): blisters   Diclofenac Sodium     Other reaction(s): Swelling/Dry mouth   Gabapentin     Other reaction(s): Lightheadedness   Polymyxin B    Avelox [Moxifloxacin Hcl In Nacl] Nausea And Vomiting   Clindamycin Rash   Clindamycin/Lincomycin Rash   Flagyl [Metronidazole] Rash   Moxifloxacin Rash   Neosporin [Neomycin-Bacitracin Zn-Polymyx] Other (See Comments)    Blisters, rash   Pristiq [Desvenlafaxine Succinate Er] Other (See Comments)    Intolerance.     Immunization History  Administered Date(s) Administered   Influenza Split 08/31/2016, 08/09/2018, 08/07/2019   Influenza Whole 07/16/2011   Influenza,inj,Quad PF,6+ Mos 08/14/2015, 08/23/2017, 08/05/2018   Influenza,inj,quad, With Preservative 08/05/2018   Influenza-Unspecified  09/11/2020   PFIZER(Purple Top)SARS-COV-2 Vaccination 11/07/2019, 12/01/2019, 09/03/2020   PPD Test 08/14/2018   Tdap 07/26/2011   Zoster, Live 12/11/2015    Past Medical History:  Diagnosis Date   Anxiety    Arthritis    lumbar- HNP, /w myelopathy   Asthma    /w reflux , consult /w Dr. Wert-2013 / Jodi Mourning INHALER PRN INFREQUENTLY   Bronchitis    used albuterol , Sept. 2013, all clear now     Complication of anesthesia    Difficulty sleeping    DUE TO PAIN   Family history of anesthesia complication    N&V- Mother    GERD (gastroesophageal reflux disease)    Chron's disease, not medically treating currently    H/O hiatal hernia    small hiatal hernia, has had endoscopy & colondoscopy   H/O vertigo    Heart murmur    since birth   Hypertension    followed by Dr. Marisue Humble    Numbness in left leg    PONV (postoperative nausea and vomiting)    Stress incontinence     Tobacco History: Social History   Tobacco Use  Smoking Status Former   Packs/day: 1.50   Years: 27.00   Total pack years: 40.50   Types: Cigarettes   Quit date: 11/15/1999   Years since quitting: 22.4  Smokeless Tobacco Never   Counseling given: Not Answered   Outpatient Medications Prior to Visit  Medication Sig Dispense Refill   albuterol (VENTOLIN HFA) 108 (90 Base) MCG/ACT inhaler INHALE 2 PUFFS INTO THE LUNGS EVERY 6 HOURS AS NEEDED FOR WHEEZING OR SHORTNESS OF BREATH * NEEDS APPT 8.5 g 0   ALPRAZolam (XANAX) 0.5 MG tablet Take 0.5 mg by mouth as needed for anxiety or sleep.      ALPRAZolam (XANAX) 0.5 MG tablet Take 1 tablet by mouth 3 times daily as needed (must last 90 days). 270 tablet 0   betamethasone dipropionate 0.05 % cream Apply to affected area 2 (two) times daily. 45 g 3   citalopram (CELEXA) 10 MG tablet Take 1 tablet by mouth everyday. 90 tablet 4   clobetasol ointment (TEMOVATE) 0.05 % Apply an eraser-sized amount to vaginal areas of concern nightly for two weeks and then twice a week thereafter. 45 g 1   diltiazem (CARDIZEM CD) 180 MG 24 hr capsule Take 1 capsule (180 mg total) by mouth daily. 90 capsule 3   esomeprazole (NEXIUM) 40 MG capsule Take 40 mg by mouth 2 (two) times daily before a meal.   1   fluticasone (FLONASE) 50 MCG/ACT nasal spray Place 2 sprays into both nostrils daily as needed (For cold symptoms).     losartan (COZAAR) 100 MG tablet Take 1 tablet by mouth daily.  90 tablet 3   mesalamine (APRISO) 0.375 g 24 hr capsule Take 4 capsules by mouth every morning 120 capsule 5   methocarbamol (ROBAXIN) 500 MG tablet Take 1 tablet by mouth 3 times daily as needed for spasms 30 tablet 0   mupirocin ointment (BACTROBAN) 2 % Apply a  small amount to affected area once a day with bandage changes 22 g 0   polyethylene glycol-electrolytes (NULYTELY) 420 g solution Use as directed 4000 mL 0   valACYclovir (VALTREX) 1000 MG tablet TAKE 2 TABLETS BY MOUTH 2 TIMES DAILY FOR 2 DAYS AT FIRST SIGN OF FEVER BLISTER 90 tablet 3   No facility-administered medications prior to visit.    Review of Systems  Review of Systems  Constitutional: Negative.   HENT: Negative.    Respiratory:  Positive for cough, shortness of breath and wheezing.   Cardiovascular: Negative.    Physical Exam  BP 118/70 (BP Location: Left Arm, Cuff Size: Normal)   Pulse 72   Temp 98.3 F (36.8 C) (Oral)   Ht '5\' 6"'$  (1.676 m)   Wt 187 lb (84.8 kg)   SpO2 95% Comment: on RA  BMI 30.18 kg/m  Physical Exam Constitutional:      General: She is not in acute distress.    Appearance: Normal appearance. She is not ill-appearing.  HENT:     Head: Normocephalic and atraumatic.     Right Ear: Tympanic membrane normal. There is no impacted cerumen.     Left Ear: Tympanic membrane normal. There is no impacted cerumen.     Mouth/Throat:     Comments: Deferred d/t masking Cardiovascular:     Rate and Rhythm: Normal rate and regular rhythm.  Pulmonary:     Effort: Pulmonary effort is normal.     Breath sounds: Normal breath sounds. No wheezing, rhonchi or rales.  Musculoskeletal:        General: Normal range of motion.     Cervical back: Normal range of motion and neck supple.  Skin:    General: Skin is warm and dry.  Neurological:     General: No focal deficit present.     Mental Status: She is alert and oriented to person, place, and time. Mental status is at baseline.  Psychiatric:         Mood and Affect: Mood normal.        Behavior: Behavior normal.        Thought Content: Thought content normal.        Judgment: Judgment normal.      Lab Results:  CBC    Component Value Date/Time   WBC 13.5 (H) 02/12/2020 1131   RBC 4.50 02/12/2020 1131   HGB 13.9 02/12/2020 1131   HCT 42.2 02/12/2020 1131   PLT 289 02/12/2020 1131   MCV 93.8 02/12/2020 1131   MCH 30.9 02/12/2020 1131   MCHC 32.9 02/12/2020 1131   RDW 12.9 02/12/2020 1131   LYMPHSABS 2.1 03/16/2010 1740   MONOABS 0.5 03/16/2010 1740   EOSABS 0.0 03/16/2010 1740   BASOSABS 0.0 03/16/2010 1740    BMET    Component Value Date/Time   NA 138 02/12/2020 1131   K 4.0 02/12/2020 1131   CL 102 02/12/2020 1131   CO2 26 02/12/2020 1131   GLUCOSE 133 (H) 02/12/2020 1131   BUN 9 02/12/2020 1131   CREATININE 0.82 02/12/2020 1131   CALCIUM 9.7 02/12/2020 1131   GFRNONAA >60 02/12/2020 1131   GFRAA >60 02/12/2020 1131    BNP No results found for: "BNP"  ProBNP No results found for: "PROBNP"  Imaging: No results found.   Assessment & Plan:   Intrinsic asthma - Symptoms have been well controlled the last year without any SABA use. Her symptoms typically only flare when acutely ill. Continue Albuterol HFA 2 puffs every 4-6 hour as needed for breakthrough sob/wheeing. FU in 1 year or sooner if needed.   Acute asthmatic bronchitis -Seen today for acute asthmatic bronchitis.  Patient developed cough with associated wheezing 2 to 3 days ago.  Lungs were clear on exam.  FENO was 45.  She received Depo-Medrol 80 mg IM x1 in office.  Sending in prescription for prednisone taper 40 mg x 2 days, 30  mg x 2 days, 20 mg x 2 days, 10 mg x 2 days.  Z-Pak to have on hold if cough does not improve or sputum becomes more purulent.  Advise she take Mucinex 600 mg twice daily for the next 7 to 10 days.  She needs to check home COVID swab between 6/12 and 6/14.  She was provided a note to be out of work until June 14.  Follow-up  as needed if symptoms do not improve or worsen   Martyn Ehrich, NP 04/25/2022

## 2022-04-29 ENCOUNTER — Other Ambulatory Visit: Payer: Self-pay | Admitting: Emergency Medicine

## 2022-04-29 ENCOUNTER — Other Ambulatory Visit (HOSPITAL_COMMUNITY): Payer: Self-pay

## 2022-04-29 MED FILL — Albuterol Sulfate Inhal Aero 108 MCG/ACT (90MCG Base Equiv): RESPIRATORY_TRACT | 25 days supply | Qty: 8.5 | Fill #0 | Status: AC

## 2022-05-03 ENCOUNTER — Other Ambulatory Visit (HOSPITAL_COMMUNITY): Payer: Self-pay

## 2022-05-03 DIAGNOSIS — M6281 Muscle weakness (generalized): Secondary | ICD-10-CM | POA: Diagnosis not present

## 2022-05-03 DIAGNOSIS — H0012 Chalazion right lower eyelid: Secondary | ICD-10-CM | POA: Diagnosis not present

## 2022-05-03 DIAGNOSIS — M25662 Stiffness of left knee, not elsewhere classified: Secondary | ICD-10-CM | POA: Diagnosis not present

## 2022-05-03 DIAGNOSIS — M25562 Pain in left knee: Secondary | ICD-10-CM | POA: Diagnosis not present

## 2022-05-03 DIAGNOSIS — R262 Difficulty in walking, not elsewhere classified: Secondary | ICD-10-CM | POA: Diagnosis not present

## 2022-05-11 DIAGNOSIS — R262 Difficulty in walking, not elsewhere classified: Secondary | ICD-10-CM | POA: Diagnosis not present

## 2022-05-11 DIAGNOSIS — M6281 Muscle weakness (generalized): Secondary | ICD-10-CM | POA: Diagnosis not present

## 2022-05-11 DIAGNOSIS — M25662 Stiffness of left knee, not elsewhere classified: Secondary | ICD-10-CM | POA: Diagnosis not present

## 2022-05-11 DIAGNOSIS — M25562 Pain in left knee: Secondary | ICD-10-CM | POA: Diagnosis not present

## 2022-05-13 ENCOUNTER — Other Ambulatory Visit (HOSPITAL_COMMUNITY): Payer: Self-pay

## 2022-05-16 ENCOUNTER — Other Ambulatory Visit (HOSPITAL_COMMUNITY): Payer: Self-pay

## 2022-05-24 DIAGNOSIS — M25662 Stiffness of left knee, not elsewhere classified: Secondary | ICD-10-CM | POA: Diagnosis not present

## 2022-05-24 DIAGNOSIS — M25562 Pain in left knee: Secondary | ICD-10-CM | POA: Diagnosis not present

## 2022-05-24 DIAGNOSIS — M6281 Muscle weakness (generalized): Secondary | ICD-10-CM | POA: Diagnosis not present

## 2022-05-24 DIAGNOSIS — R262 Difficulty in walking, not elsewhere classified: Secondary | ICD-10-CM | POA: Diagnosis not present

## 2022-05-27 DIAGNOSIS — R3129 Other microscopic hematuria: Secondary | ICD-10-CM | POA: Diagnosis not present

## 2022-05-27 DIAGNOSIS — R82998 Other abnormal findings in urine: Secondary | ICD-10-CM | POA: Diagnosis not present

## 2022-05-30 ENCOUNTER — Other Ambulatory Visit (HOSPITAL_COMMUNITY): Payer: Self-pay

## 2022-05-30 DIAGNOSIS — Z87891 Personal history of nicotine dependence: Secondary | ICD-10-CM | POA: Diagnosis not present

## 2022-05-30 MED ORDER — ACETAMINOPHEN 325 MG PO TABS: ORAL_TABLET | ORAL | 0 refills | Status: DC

## 2022-05-31 DIAGNOSIS — L821 Other seborrheic keratosis: Secondary | ICD-10-CM | POA: Diagnosis not present

## 2022-05-31 DIAGNOSIS — L565 Disseminated superficial actinic porokeratosis (DSAP): Secondary | ICD-10-CM | POA: Diagnosis not present

## 2022-05-31 DIAGNOSIS — D225 Melanocytic nevi of trunk: Secondary | ICD-10-CM | POA: Diagnosis not present

## 2022-05-31 DIAGNOSIS — L820 Inflamed seborrheic keratosis: Secondary | ICD-10-CM | POA: Diagnosis not present

## 2022-05-31 DIAGNOSIS — D485 Neoplasm of uncertain behavior of skin: Secondary | ICD-10-CM | POA: Diagnosis not present

## 2022-05-31 DIAGNOSIS — L570 Actinic keratosis: Secondary | ICD-10-CM | POA: Diagnosis not present

## 2022-06-06 ENCOUNTER — Other Ambulatory Visit (HOSPITAL_COMMUNITY): Payer: Self-pay

## 2022-06-13 ENCOUNTER — Other Ambulatory Visit (HOSPITAL_COMMUNITY): Payer: Self-pay

## 2022-06-14 ENCOUNTER — Other Ambulatory Visit (HOSPITAL_COMMUNITY): Payer: Self-pay

## 2022-06-15 DIAGNOSIS — R262 Difficulty in walking, not elsewhere classified: Secondary | ICD-10-CM | POA: Diagnosis not present

## 2022-06-15 DIAGNOSIS — M25562 Pain in left knee: Secondary | ICD-10-CM | POA: Diagnosis not present

## 2022-06-15 DIAGNOSIS — M6281 Muscle weakness (generalized): Secondary | ICD-10-CM | POA: Diagnosis not present

## 2022-06-15 DIAGNOSIS — M25662 Stiffness of left knee, not elsewhere classified: Secondary | ICD-10-CM | POA: Diagnosis not present

## 2022-06-28 ENCOUNTER — Other Ambulatory Visit (HOSPITAL_COMMUNITY): Payer: Self-pay

## 2022-06-28 DIAGNOSIS — M25562 Pain in left knee: Secondary | ICD-10-CM | POA: Diagnosis not present

## 2022-06-28 MED ORDER — PREDNISONE 5 MG PO TABS
ORAL_TABLET | ORAL | 0 refills | Status: DC
  Filled 2022-06-28: qty 21, 12d supply, fill #0

## 2022-06-28 MED ORDER — CITALOPRAM HYDROBROMIDE 20 MG PO TABS: 20.0000 mg | ORAL_TABLET | Freq: Every day | ORAL | 4 refills | Status: DC

## 2022-06-28 MED ORDER — TRAZODONE HCL 50 MG PO TABS
ORAL_TABLET | ORAL | 2 refills | Status: AC
  Filled 2022-06-28: qty 180, 90d supply, fill #0

## 2022-07-06 DIAGNOSIS — M25562 Pain in left knee: Secondary | ICD-10-CM | POA: Diagnosis not present

## 2022-07-07 DIAGNOSIS — Z9889 Other specified postprocedural states: Secondary | ICD-10-CM | POA: Diagnosis not present

## 2022-07-12 ENCOUNTER — Other Ambulatory Visit (HOSPITAL_COMMUNITY): Payer: Self-pay

## 2022-07-14 ENCOUNTER — Other Ambulatory Visit (HOSPITAL_COMMUNITY): Payer: Self-pay

## 2022-07-19 ENCOUNTER — Other Ambulatory Visit (HOSPITAL_COMMUNITY): Payer: Self-pay

## 2022-07-20 ENCOUNTER — Other Ambulatory Visit (HOSPITAL_COMMUNITY): Payer: Self-pay

## 2022-07-20 MED ORDER — MESALAMINE ER 0.375 G PO CP24
0.3750 g | ORAL_CAPSULE | ORAL | 5 refills | Status: DC
Start: 2022-07-20 — End: 2024-01-26
  Filled 2022-07-20 – 2022-08-23 (×2): qty 120, 30d supply, fill #0
  Filled 2022-09-28: qty 120, 30d supply, fill #1
  Filled 2022-10-31: qty 11, 11d supply, fill #2
  Filled 2022-10-31: qty 109, 109d supply, fill #2
  Filled 2022-10-31: qty 109, 27d supply, fill #2
  Filled 2022-10-31: qty 11, 3d supply, fill #2
  Filled 2022-12-06: qty 120, 30d supply, fill #3
  Filled 2023-01-11: qty 120, 30d supply, fill #0
  Filled 2023-01-11 (×2): qty 120, 30d supply, fill #4
  Filled 2023-02-05: qty 120, 30d supply, fill #1

## 2022-07-30 DIAGNOSIS — M25552 Pain in left hip: Secondary | ICD-10-CM | POA: Diagnosis not present

## 2022-07-30 DIAGNOSIS — M25532 Pain in left wrist: Secondary | ICD-10-CM | POA: Diagnosis not present

## 2022-07-30 DIAGNOSIS — M25562 Pain in left knee: Secondary | ICD-10-CM | POA: Diagnosis not present

## 2022-08-11 DIAGNOSIS — M25562 Pain in left knee: Secondary | ICD-10-CM | POA: Diagnosis not present

## 2022-08-11 DIAGNOSIS — M25552 Pain in left hip: Secondary | ICD-10-CM | POA: Diagnosis not present

## 2022-08-23 DIAGNOSIS — M7989 Other specified soft tissue disorders: Secondary | ICD-10-CM | POA: Diagnosis not present

## 2022-08-24 ENCOUNTER — Other Ambulatory Visit (HOSPITAL_COMMUNITY): Payer: Self-pay

## 2022-08-26 ENCOUNTER — Other Ambulatory Visit (HOSPITAL_COMMUNITY): Payer: Self-pay

## 2022-08-29 DIAGNOSIS — I10 Essential (primary) hypertension: Secondary | ICD-10-CM | POA: Diagnosis not present

## 2022-08-29 DIAGNOSIS — R011 Cardiac murmur, unspecified: Secondary | ICD-10-CM | POA: Diagnosis not present

## 2022-08-29 DIAGNOSIS — R600 Localized edema: Secondary | ICD-10-CM | POA: Diagnosis not present

## 2022-08-29 DIAGNOSIS — M7989 Other specified soft tissue disorders: Secondary | ICD-10-CM | POA: Diagnosis not present

## 2022-08-29 DIAGNOSIS — E785 Hyperlipidemia, unspecified: Secondary | ICD-10-CM | POA: Diagnosis not present

## 2022-08-29 DIAGNOSIS — Z23 Encounter for immunization: Secondary | ICD-10-CM | POA: Diagnosis not present

## 2022-09-01 ENCOUNTER — Other Ambulatory Visit: Payer: Self-pay | Admitting: *Deleted

## 2022-09-01 DIAGNOSIS — M7989 Other specified soft tissue disorders: Secondary | ICD-10-CM

## 2022-09-05 ENCOUNTER — Other Ambulatory Visit (HOSPITAL_COMMUNITY): Payer: Self-pay | Admitting: Family Medicine

## 2022-09-05 DIAGNOSIS — R011 Cardiac murmur, unspecified: Secondary | ICD-10-CM

## 2022-09-06 ENCOUNTER — Other Ambulatory Visit (HOSPITAL_COMMUNITY): Payer: Self-pay

## 2022-09-09 DIAGNOSIS — R197 Diarrhea, unspecified: Secondary | ICD-10-CM | POA: Diagnosis not present

## 2022-09-09 DIAGNOSIS — R1013 Epigastric pain: Secondary | ICD-10-CM | POA: Diagnosis not present

## 2022-09-09 DIAGNOSIS — R112 Nausea with vomiting, unspecified: Secondary | ICD-10-CM | POA: Diagnosis not present

## 2022-09-09 DIAGNOSIS — R933 Abnormal findings on diagnostic imaging of other parts of digestive tract: Secondary | ICD-10-CM | POA: Diagnosis not present

## 2022-09-09 DIAGNOSIS — K5090 Crohn's disease, unspecified, without complications: Secondary | ICD-10-CM | POA: Diagnosis not present

## 2022-09-09 DIAGNOSIS — Z888 Allergy status to other drugs, medicaments and biological substances status: Secondary | ICD-10-CM | POA: Diagnosis not present

## 2022-09-09 DIAGNOSIS — Z885 Allergy status to narcotic agent status: Secondary | ICD-10-CM | POA: Diagnosis not present

## 2022-09-09 DIAGNOSIS — Z881 Allergy status to other antibiotic agents status: Secondary | ICD-10-CM | POA: Diagnosis not present

## 2022-09-09 DIAGNOSIS — J45909 Unspecified asthma, uncomplicated: Secondary | ICD-10-CM | POA: Diagnosis not present

## 2022-09-16 NOTE — Progress Notes (Unsigned)
Requested by:  Collene Leyden, MD [address] [place],  [place] [address]  Reason for consultation: lower extremity edema    History of Present Illness   Nicole [name] is a 66 y.o. (01/21/1956) female who presents for evaluation of lower extremity edema.   Venous symptoms include: (aching, heavy, tired, throbbing, burning, itching, swelling, bleeding, ulcer)  *** Onset/duration:  ***  Occupation:  *** Aggravating factors: (sitting, standing) Alleviating factors: (elevation) Compression:  *** Helps:  *** Pain medications:  *** Previous vein procedures:  *** History of DVT:  ***  Past Medical History:  Diagnosis Date   Anxiety    Arthritis    lumbar- HNP, /w myelopathy   Asthma    /w reflux , consult /w Dr. Wert-2013 / Lillia Pauls PRN INFREQUENTLY   Bronchitis    used albuterol , Sept. 2013, all clear now    Complication of anesthesia    Difficulty sleeping    DUE TO PAIN   Family history of anesthesia complication    N&V- Mother    GERD (gastroesophageal reflux disease)    Chron's disease, not medically treating currently    H/O hiatal hernia    small hiatal hernia, has had endoscopy & colondoscopy   H/O vertigo    Heart murmur    since birth   Hypertension    followed by Dr. Marisue Humble    Numbness in left leg    PONV (postoperative nausea and vomiting)    Stress incontinence     Past Surgical History:  Procedure Laterality Date   BACK SURGERY  2013   CHOLECYSTECTOMY  03   laparoscopic   LUMBAR LAMINECTOMY/DECOMPRESSION MICRODISCECTOMY  09/10/2012   Procedure: LUMBAR LAMINECTOMY/DECOMPRESSION MICRODISCECTOMY 1 LEVEL;  Surgeon: Kristeen Miss, MD;  Location: [facility];  Service: Neurosurgery;  Laterality: Left;  Left Lumbar Five-Sacral One Microdiscectomy   TOTAL HIP ARTHROPLASTY Left 12/24/2014   Procedure: LEFT TOTAL HIP ARTHROPLASTY ANTERIOR APPROACH;  Surgeon: Mauri Pole, MD;  Location: [facility];  Service: Orthopedics;  Laterality: Left;     Social History   Socioeconomic History   Marital status: Divorced    Spouse name: Not on file   Number of children: 0   Years of education: 16   Highest education level: Not on file  Occupational History   Occupation: RN  Tobacco Use   Smoking status: Former    Packs/day: 1.50    Years: 27.00    Total pack years: 40.50    Types: Cigarettes    Quit date: 11/15/1999    Years since quitting: 22.8   Smokeless tobacco: Never  Vaping Use   Vaping Use: Never used  Substance and Sexual Activity   Alcohol use: Yes    Comment: Seldom drinks, but occasional wine.   Drug use: No   Sexual activity: Not on file  Other Topics Concern   Not on file  Social History Narrative   Lives with mom and dad in a one story home.  Has no children.  Works as a travel Marine scientist.  Education: college.   Social Determinants of Health   Financial Resource Strain: Not on file  Food Insecurity: Not on file  Transportation Needs: Not on file  Physical Activity: Not on file  Stress: Not on file  Social Connections: Not on file  Intimate Partner Violence: Not on file   *** Family History  Problem Relation Age of Onset   Kidney disease Mother    Asthma  Mother    Thyroid disease Mother    Glaucoma Mother    Diabetes Father    Stroke Father    Hypertension Father    Kidney disease Father    Hypertension Sister     Current Outpatient Medications  Medication Sig Dispense Refill   acetaminophen (TYLENOL) 325 MG tablet Take 2 tablets (650 mg total) by mouth every 4 (four) hours as needed for up to 7 days 60 tablet 0   albuterol (VENTOLIN HFA) 108 (90 Base) MCG/ACT inhaler Inhale 2 puffs into the lungs every 4 (four) hours as needed. 8.5 g 2   ALPRAZolam (XANAX) 0.5 MG tablet Take 0.5 mg by mouth as needed for anxiety or sleep.      ALPRAZolam (XANAX) 0.5 MG tablet Take 1 tablet by mouth 3 times daily as needed (must last 90 days). 270 tablet 0   azithromycin (ZITHROMAX Z-PAK) 250 MG tablet Take 2  tablets by mouth today, followed by 1 tablet daily for 4 days 6 tablet 0   betamethasone dipropionate 0.05 % cream Apply to affected area 2 (two) times daily. 45 g 3   citalopram (CELEXA) 10 MG tablet Take 1 tablet by mouth everyday. 90 tablet 4   citalopram (CELEXA) 20 MG tablet Take 1 tablet (20 mg total) by mouth daily. 90 tablet 4   clobetasol ointment (TEMOVATE) 0.05 % Apply an eraser-sized amount to vaginal areas of concern nightly for two weeks and then twice a week thereafter. 45 g 1   diltiazem (CARDIZEM CD) 180 MG 24 hr capsule Take 1 capsule (180 mg total) by mouth daily. 90 capsule 3   esomeprazole (NEXIUM) 40 MG capsule Take 40 mg by mouth 2 (two) times daily before a meal.   1   fluticasone (FLONASE) 50 MCG/ACT nasal spray Place 2 sprays into both nostrils daily as needed (For cold symptoms).     losartan (COZAAR) 100 MG tablet Take 1 tablet by mouth daily. 90 tablet 3   mesalamine (APRISO) 0.375 g 24 hr capsule Take 4 capsules by mouth every morning 120 capsule 5   mupirocin ointment (BACTROBAN) 2 % Apply a  small amount to affected area once a day with bandage changes 22 g 0   polyethylene glycol-electrolytes (NULYTELY) 420 g solution Use as directed 4000 mL 0   predniSONE (DELTASONE) 10 MG tablet Take 4 tablets by mouth for 2 days, 3 tablets for 2 days, 2 tablets for 2 days, then 1 tablet for 2 days, then stop 20 tablet 0   predniSONE (DELTASONE) 5 MG tablet Take 3 tablets by mouth on days 1 and 2, take 2 tablets on Days 3-7 and take 1 tablet on Days 8-12 21 tablet 0   traZODone (DESYREL) 50 MG tablet Take 1/2-2 tablets by mouth at bedtime as needed 180 tablet 2   valACYclovir (VALTREX) 1000 MG tablet TAKE 2 TABLETS BY MOUTH 2 TIMES DAILY FOR 2 DAYS AT FIRST SIGN OF FEVER BLISTER 90 tablet 3   No current facility-administered medications for this visit.    Allergies  Allergen Reactions   Hctz [Hydrochlorothiazide] Other (See Comments)    MUSCLE CRAMPING   Hydralazine Hcl  Other (See Comments)    Cramping   Metoclopramide Hcl Other (See Comments)    Cramping, anxiety and depression; "makes me crazy"   Other Other (See Comments)    Cramping   Tramadol Other (See Comments)    "wig out"   Voltaren [Diclofenac Sodium] Other (See Comments)  Swelling in leg and became parched.    Bacitracin    Bacitracin-Polymyxin B     Other reaction(s): blisters   Diclofenac Sodium     Other reaction(s): Swelling/Dry mouth   Gabapentin     Other reaction(s): Lightheadedness   Polymyxin B    Avelox [Moxifloxacin Hcl In Nacl] Nausea And Vomiting   Clindamycin Rash   Clindamycin/Lincomycin Rash   Flagyl [Metronidazole] Rash   Moxifloxacin Rash   Neosporin [Neomycin-Bacitracin Zn-Polymyx] Other (See Comments)    Blisters, rash   Pristiq [Desvenlafaxine Succinate Er] Other (See Comments)    Intolerance.     ***REVIEW OF SYSTEMS (negative unless checked):   Cardiac:  '[]'$  Chest pain or chest pressure? '[]'$  Shortness of breath upon activity? '[]'$  Shortness of breath when lying flat? '[]'$  Irregular heart rhythm?  Vascular:  '[]'$  Pain in calf, thigh, or hip brought on by walking? '[]'$  Pain in feet at night that wakes you up from your sleep? '[]'$  Blood clot in your veins? '[]'$  Leg swelling?  Pulmonary:  '[]'$  Oxygen at home? '[]'$  Productive cough? '[]'$  Wheezing?  Neurologic:  '[]'$  Sudden weakness in arms or legs? '[]'$  Sudden numbness in arms or legs? '[]'$  Sudden onset of difficult speaking or slurred speech? '[]'$  Temporary loss of vision in one eye? '[]'$  Problems with dizziness?  Gastrointestinal:  '[]'$  Blood in stool? '[]'$  Vomited blood?  Genitourinary:  '[]'$  Burning when urinating? '[]'$  Blood in urine?  Psychiatric:  '[]'$  Major depression  Hematologic:  '[]'$  Bleeding problems? '[]'$  Problems with blood clotting?  Dermatologic:  '[]'$  Rashes or ulcers?  Constitutional:  '[]'$  Fever or chills?  Ear/Nose/Throat:  '[]'$  Change in hearing? '[]'$  Nose bleeds? '[]'$  Sore throat?  Musculoskeletal:   '[]'$  Back pain? '[]'$  Joint pain? '[]'$  Muscle pain?   Physical Examination    There were no vitals filed for this visit. There is no height or weight on file to calculate BMI.  General:  WDWN in NAD; vital signs documented above Gait: Not observed HENT: WNL, normocephalic Pulmonary: normal non-labored breathing , without Rales, rhonchi,  wheezing Cardiac: {Desc; regular/irreg:14544} HR, without  Murmurs {With/Without:20273} carotid bruit*** Abdomen: soft, NT, no masses Skin: {With/Without:20273} rashes Vascular Exam/Pulses:  Right Left  Radial {Exam; arterial pulse strength 0-4:30167} {Exam; arterial pulse strength 0-4:30167}  Ulnar {Exam; arterial pulse strength 0-4:30167} {Exam; arterial pulse strength 0-4:30167}  Femoral {Exam; arterial pulse strength 0-4:30167} {Exam; arterial pulse strength 0-4:30167}  Popliteal {Exam; arterial pulse strength 0-4:30167} {Exam; arterial pulse strength 0-4:30167}  DP {Exam; arterial pulse strength 0-4:30167} {Exam; arterial pulse strength 0-4:30167}  PT {Exam; arterial pulse strength 0-4:30167} {Exam; arterial pulse strength 0-4:30167}   Extremities: {With/Without:20273} varicose veins, {With/Without:20273} reticular veins, {With/Without:20273} edema, {With/Without:20273} stasis pigmentation, {With/Without:20273} lipodermatosclerosis, {With/Without:20273} ulcers Musculoskeletal: no muscle wasting or atrophy  Neurologic: A&O X 3;  No focal weakness or paresthesias are detected Psychiatric:  The pt has {Desc; normal/abnormal:11317::"Normal"} affect.  Non-invasive Vascular Imaging   BLE Venous Insufficiency Duplex (***):  RLE:  *** DVT and SVT,  *** GSV reflux ***, GSV diameter *** *** SSV reflux ***, *** deep venous reflux  LLE: *** DVT and SVT,  *** GSV reflux ***,  GSV diameter *** *** SSV reflux ***, *** deep venous reflux   Medical Decision Making   JONNI OELKERS is a 66 y.o. female who presents with: ***LE chronic venous  insufficiency, ***varicose veins with complications  Based on the patient's history and examination, I recommend: ***. I discussed with the patient the use of her 20-30  mm thigh high compression stockings and need for 3 month trial of such. The patient will follow up in 3 months with Dr. Marland Kitchen Thank you for allowing [place] to participate in this patient's care.   Karoline Caldwell, PA-C [facility] Office: [phone]  09/16/2022, 11:49 AM  Clinic MD: Trula Slade

## 2022-09-19 ENCOUNTER — Ambulatory Visit (HOSPITAL_COMMUNITY)
Admission: RE | Admit: 2022-09-19 | Discharge: 2022-09-19 | Disposition: A | Discharge status: Home / Self Care | Payer: Medicare Other | Source: Ambulatory Visit | Attending: Surgery | Admitting: Surgery

## 2022-09-19 ENCOUNTER — Ambulatory Visit: Payer: Medicare Other | Admitting: Physician Assistant

## 2022-09-19 ENCOUNTER — Ambulatory Visit (HOSPITAL_BASED_OUTPATIENT_CLINIC_OR_DEPARTMENT_OTHER): Payer: Medicare Other

## 2022-09-19 VITALS — BP 113/58 | HR 56 | Temp 98.6°F | Resp 20 | Ht 66.0 in | Wt 186.9 lb

## 2022-09-19 DIAGNOSIS — M7989 Other specified soft tissue disorders: Secondary | ICD-10-CM

## 2022-09-19 DIAGNOSIS — R011 Cardiac murmur, unspecified: Secondary | ICD-10-CM | POA: Insufficient documentation

## 2022-09-19 DIAGNOSIS — R87619 Unspecified abnormal cytological findings in specimens from cervix uteri: Secondary | ICD-10-CM | POA: Insufficient documentation

## 2022-09-19 DIAGNOSIS — I872 Venous insufficiency (chronic) (peripheral): Secondary | ICD-10-CM

## 2022-09-19 DIAGNOSIS — M1990 Unspecified osteoarthritis, unspecified site: Secondary | ICD-10-CM | POA: Insufficient documentation

## 2022-09-19 DIAGNOSIS — K589 Irritable bowel syndrome without diarrhea: Secondary | ICD-10-CM | POA: Insufficient documentation

## 2022-09-19 LAB — ECHOCARDIOGRAM COMPLETE
AR max vel: 1.56 cm2
AV Area VTI: 1.78 cm2
AV Area mean vel: 1.6 cm2
AV Mean grad: 9 mmHg
AV Peak grad: 20.3 mmHg
Ao pk vel: 2.25 m/s
Area-P 1/2: 3.77 cm2
Height: 66 in
S' Lateral: 2.7 cm
Weight: 2990.4 oz

## 2022-09-28 ENCOUNTER — Other Ambulatory Visit (HOSPITAL_COMMUNITY): Payer: Self-pay

## 2022-10-11 ENCOUNTER — Other Ambulatory Visit (HOSPITAL_COMMUNITY): Payer: Self-pay

## 2022-10-11 MED ORDER — ALPRAZOLAM 0.5 MG PO TABS
0.5000 mg | ORAL_TABLET | Freq: Three times a day (TID) | ORAL | 0 refills | Status: DC | PRN
  Filled 2022-10-11: qty 270, 90d supply, fill #0

## 2022-10-12 ENCOUNTER — Other Ambulatory Visit (HOSPITAL_COMMUNITY): Payer: Self-pay

## 2022-10-17 ENCOUNTER — Other Ambulatory Visit (HOSPITAL_COMMUNITY): Payer: Self-pay

## 2022-10-17 DIAGNOSIS — K5010 Crohn's disease of large intestine without complications: Secondary | ICD-10-CM | POA: Diagnosis not present

## 2022-10-18 ENCOUNTER — Other Ambulatory Visit (HOSPITAL_COMMUNITY): Payer: Self-pay

## 2022-10-19 ENCOUNTER — Other Ambulatory Visit (HOSPITAL_COMMUNITY): Payer: Self-pay

## 2022-10-19 MED ORDER — CITALOPRAM HYDROBROMIDE 20 MG PO TABS
20.0000 mg | ORAL_TABLET | Freq: Every day | ORAL | 0 refills | Status: DC
  Filled 2022-10-19: qty 90, 90d supply, fill #0

## 2022-10-29 ENCOUNTER — Other Ambulatory Visit (HOSPITAL_COMMUNITY): Payer: Self-pay

## 2022-10-31 ENCOUNTER — Other Ambulatory Visit: Payer: Self-pay

## 2022-10-31 ENCOUNTER — Other Ambulatory Visit (HOSPITAL_COMMUNITY): Payer: Self-pay

## 2022-11-01 ENCOUNTER — Other Ambulatory Visit (HOSPITAL_COMMUNITY): Payer: Self-pay

## 2022-11-04 ENCOUNTER — Other Ambulatory Visit (HOSPITAL_COMMUNITY): Payer: Self-pay

## 2022-11-04 DIAGNOSIS — M25551 Pain in right hip: Secondary | ICD-10-CM | POA: Diagnosis not present

## 2022-11-08 ENCOUNTER — Other Ambulatory Visit: Payer: Self-pay

## 2022-11-08 ENCOUNTER — Other Ambulatory Visit (HOSPITAL_COMMUNITY): Payer: Self-pay

## 2022-11-08 MED ORDER — PREDNISONE 5 MG (21) PO TBPK
ORAL_TABLET | ORAL | 0 refills | Status: DC
  Filled 2022-11-08: qty 21, 6d supply, fill #0

## 2022-11-09 ENCOUNTER — Other Ambulatory Visit (HOSPITAL_COMMUNITY): Payer: Self-pay

## 2022-12-06 ENCOUNTER — Other Ambulatory Visit (HOSPITAL_COMMUNITY): Payer: Self-pay

## 2022-12-08 DIAGNOSIS — Z96642 Presence of left artificial hip joint: Secondary | ICD-10-CM | POA: Diagnosis not present

## 2022-12-08 DIAGNOSIS — M25551 Pain in right hip: Secondary | ICD-10-CM | POA: Diagnosis not present

## 2022-12-12 ENCOUNTER — Other Ambulatory Visit (HOSPITAL_COMMUNITY): Payer: Self-pay

## 2022-12-12 ENCOUNTER — Encounter: Payer: Self-pay | Admitting: Pulmonary Disease

## 2022-12-12 ENCOUNTER — Telehealth: Payer: Self-pay | Admitting: Student

## 2022-12-12 ENCOUNTER — Ambulatory Visit: Payer: Medicare Other | Admitting: Pulmonary Disease

## 2022-12-12 VITALS — BP 104/62 | HR 76 | Ht 66.0 in | Wt 187.0 lb

## 2022-12-12 DIAGNOSIS — R051 Acute cough: Secondary | ICD-10-CM | POA: Diagnosis not present

## 2022-12-12 DIAGNOSIS — R0982 Postnasal drip: Secondary | ICD-10-CM

## 2022-12-12 DIAGNOSIS — J45909 Unspecified asthma, uncomplicated: Secondary | ICD-10-CM | POA: Diagnosis not present

## 2022-12-12 MED ORDER — PREDNISONE 10 MG PO TABS
20.0000 mg | ORAL_TABLET | Freq: Every day | ORAL | 0 refills | Status: DC
  Filled 2022-12-12: qty 28, 14d supply, fill #0

## 2022-12-12 MED ORDER — ALBUTEROL SULFATE HFA 108 (90 BASE) MCG/ACT IN AERS
2.0000 | INHALATION_SPRAY | Freq: Four times a day (QID) | RESPIRATORY_TRACT | 6 refills | Status: DC | PRN
  Filled 2022-12-12: qty 6.7, 25d supply, fill #0

## 2022-12-12 MED ORDER — AZITHROMYCIN 250 MG PO TABS
ORAL_TABLET | ORAL | 0 refills | Status: AC
  Filled 2022-12-12: qty 6, 5d supply, fill #0

## 2022-12-12 NOTE — Progress Notes (Signed)
Nicole [name]    [id number]    05/14/1956  Primary Care Physician:Ehinger, Herbie [place], MD  Referring Physician: Gaynelle Arabian, MD [address],  [place] [address]  Chief complaint:   Patient presenting with shortness of breath, cough, wheezing for the last 1 week  HPI:  Just recently come back from vacation, started getting more short of breath, coughing, wheezing Over the last day or 2 she noticed that she could not taste and smell well Denies any fever or chills  Does have a history of asthma  Uses albuterol as needed  Last significant episode was about 2 years ago Required steroids, bronchodilator treatments  Reformed smoker quit over 20 years ago  Is a nurse  Triggers for asthma include exposure to smoke, smells not currently on any maintenance inhaler Was not exposed to anybody with COVID diagnosis recently   Outpatient Encounter Medications as of 12/12/2022  Medication Sig   acetaminophen (TYLENOL) 325 MG tablet Take 2 tablets (650 mg total) by mouth every 4 (four) hours as needed for up to 7 days   albuterol (VENTOLIN HFA) 108 (90 Base) MCG/ACT inhaler Inhale 2 puffs into the lungs every 4 (four) hours as needed.   ALPRAZolam (XANAX) 0.5 MG tablet Take 1 mg by mouth as needed for anxiety or sleep.   citalopram (CELEXA) 10 MG tablet Take 1 tablet by mouth everyday. (Patient taking differently: Take 20 mg by mouth daily.)   diltiazem (CARDIZEM CD) 180 MG 24 hr capsule Take 1 capsule (180 mg total) by mouth daily.   esomeprazole (NEXIUM) 40 MG capsule Take 40 mg by mouth 2 (two) times daily before a meal.    fluticasone (FLONASE) 50 MCG/ACT nasal spray Place 2 sprays into both nostrils daily as needed (For cold symptoms).   losartan (COZAAR) 100 MG tablet Take 1 tablet by mouth daily.   mesalamine (APRISO) 0.375 g 24 hr capsule Take 4 capsules by mouth every morning   mupirocin ointment (BACTROBAN) 2 % Apply a  small amount to affected  area once a day with bandage changes   traZODone (DESYREL) 50 MG tablet Take 1/2-2 tablets by mouth at bedtime as needed   valACYclovir (VALTREX) 1000 MG tablet TAKE 2 TABLETS BY MOUTH 2 TIMES DAILY FOR 2 DAYS AT FIRST SIGN OF FEVER BLISTER   ALPRAZolam (XANAX) 0.5 MG tablet Take 1 tablet by mouth 3 times daily as needed (must last 90 days). (Patient not taking: Reported on 12/12/2022)   [DISCONTINUED] ALPRAZolam (XANAX) 0.5 MG tablet Take 1 tablet (0.5 mg total) by mouth 3 (three) times daily as needed. Must last 90 days. (Patient not taking: Reported on 12/12/2022)   [DISCONTINUED] azithromycin (ZITHROMAX Z-PAK) 250 MG tablet Take 2 tablets by mouth today, followed by 1 tablet daily for 4 days (Patient not taking: Reported on 12/12/2022)   [DISCONTINUED] betamethasone dipropionate 0.05 % cream Apply to affected area 2 (two) times daily. (Patient not taking: Reported on 12/12/2022)   [DISCONTINUED] citalopram (CELEXA) 20 MG tablet Take 1 tablet (20 mg total) by mouth daily. (Patient not taking: Reported on 12/12/2022)   [DISCONTINUED] clobetasol ointment (TEMOVATE) 0.05 % Apply an eraser-sized amount to vaginal areas of concern nightly for two weeks and then twice a week thereafter. (Patient not taking: Reported on 12/12/2022)   [DISCONTINUED] polyethylene glycol-electrolytes (NULYTELY) 420 g solution Use as directed (Patient not taking: Reported on 12/12/2022)   [DISCONTINUED] predniSONE (DELTASONE) 10 MG tablet Take 4  tablets by mouth for 2 days, 3 tablets for 2 days, 2 tablets for 2 days, then 1 tablet for 2 days, then stop (Patient not taking: Reported on 12/12/2022)   [DISCONTINUED] predniSONE (DELTASONE) 5 MG tablet Take 3 tablets by mouth on days 1 and 2, take 2 tablets on Days 3-7 and take 1 tablet on Days 8-12 (Patient not taking: Reported on 12/12/2022)   [DISCONTINUED] predniSONE (STERAPRED UNI-PAK 21 TAB) 5 MG (21) TBPK tablet Take as directed per package instructions. (Patient not taking: Reported  on 12/12/2022)   No facility-administered encounter medications on file as of 12/12/2022.    Allergies as of 12/12/2022 - Review Complete 12/12/2022  Allergen Reaction Noted   Hctz [hydrochlorothiazide] Other (See Comments) 12/18/2014   Hydralazine hcl Other (See Comments) 05/19/2017   Metoclopramide hcl Other (See Comments) 09/23/2011   Other Other (See Comments) 05/19/2017   Tramadol Other (See Comments) 05/19/2017   Voltaren [diclofenac sodium] Other (See Comments) 12/17/2014   Bacitracin  04/07/2022   Bacitracin-polymyxin b  01/26/2022   Diclofenac sodium  04/07/2022   Gabapentin  01/26/2022   Polymyxin b  04/07/2022   Avelox [moxifloxacin hcl in nacl] Nausea And Vomiting 09/23/2011   Clindamycin Rash 05/19/2017   Clindamycin/lincomycin Rash 10/01/2015   Flagyl [metronidazole] Rash 03/17/2015   Moxifloxacin Rash 05/19/2017   Neosporin [neomycin-bacitracin zn-polymyx] Other (See Comments) 06/26/2012   Pristiq [desvenlafaxine succinate er] Other (See Comments) 07/04/2016    Past Medical History:  Diagnosis Date   Anxiety    Arthritis    lumbar- HNP, /w myelopathy   Asthma    /w reflux , consult /w Dr. Wert-2013 / Lillia Pauls PRN INFREQUENTLY   Bronchitis    used albuterol , Sept. 2013, all clear now    Complication of anesthesia    Difficulty sleeping    DUE TO PAIN   Family history of anesthesia complication    N&V- Mother    GERD (gastroesophageal reflux disease)    Chron's disease, not medically treating currently    H/O hiatal hernia    small hiatal hernia, has had endoscopy & colondoscopy   H/O vertigo    Heart murmur    since birth   Hypertension    followed by Dr. Marisue Humble    Numbness in left leg    PONV (postoperative nausea and vomiting)    Stress incontinence     Past Surgical History:  Procedure Laterality Date   BACK SURGERY  2013   CHOLECYSTECTOMY  03   laparoscopic   LUMBAR LAMINECTOMY/DECOMPRESSION MICRODISCECTOMY  09/10/2012   Procedure:  LUMBAR LAMINECTOMY/DECOMPRESSION MICRODISCECTOMY 1 LEVEL;  Surgeon: Kristeen Miss, MD;  Location: [facility];  Service: Neurosurgery;  Laterality: Left;  Left Lumbar Five-Sacral One Microdiscectomy   TOTAL HIP ARTHROPLASTY Left 12/24/2014   Procedure: LEFT TOTAL HIP ARTHROPLASTY ANTERIOR APPROACH;  Surgeon: Mauri Pole, MD;  Location: [facility];  Service: Orthopedics;  Laterality: Left;    Family History  Problem Relation Age of Onset   Kidney disease Mother    Asthma Mother    Thyroid disease Mother    Glaucoma Mother    Diabetes Father    Stroke Father    Hypertension Father    Kidney disease Father    Hypertension Sister     Social History   Socioeconomic History   Marital status: Divorced    Spouse name: Not on file   Number of children: 0   Years of education: 16   Highest education level: Not  on file  Occupational History   Occupation: RN  Tobacco Use   Smoking status: Former    Packs/day: 1.50    Years: 27.00    Total pack years: 40.50    Types: Cigarettes    Quit date: 11/15/1999    Years since quitting: 23.0    Passive exposure: Never   Smokeless tobacco: Never  Vaping Use   Vaping Use: Never used  Substance and Sexual Activity   Alcohol use: Yes    Comment: Seldom drinks, but occasional wine.   Drug use: No   Sexual activity: Not on file  Other Topics Concern   Not on file  Social History Narrative   Lives with mom and dad in a one story home.  Has no children.  Works as a travel Marine scientist.  Education: college.   Social Determinants of Health   Financial Resource Strain: Not on file  Food Insecurity: Not on file  Transportation Needs: Not on file  Physical Activity: Not on file  Stress: Not on file  Social Connections: Not on file  Intimate Partner Violence: Not on file    Review of Systems  Respiratory:  Positive for cough, shortness of breath and wheezing.     Vitals:   12/12/22 1141  BP: 104/62  Pulse: 76  SpO2: 96%     Physical  Exam Constitutional:      Appearance: Normal appearance.  HENT:     Head: Normocephalic.     Mouth/Throat:     Mouth: Mucous membranes are moist.  Eyes:     Pupils: Pupils are equal, round, and reactive to light.  Cardiovascular:     Rate and Rhythm: Normal rate and regular rhythm.     Heart sounds: No murmur heard.    No friction rub.  Pulmonary:     Effort: No respiratory distress.     Breath sounds: No stridor. Wheezing present. No rhonchi.  Musculoskeletal:     Cervical back: No rigidity or tenderness.  Neurological:     Mental Status: She is alert.  Psychiatric:        Mood and Affect: Mood normal.    Data Reviewed: No recent chest x-rays  COVID test done today-negative  Assessment:  Asthma with exacerbation  Shortness of breath on exertion  Cough with wheezing  Inhaler technique was reviewed in the office today and is good  Plan/Recommendations: Prescription for steroids 20 mg daily, decrease to 10 mg once feeling better Stop medication 7 to 10 days  Prescription for albuterol to be used as needed  Prescription for azithromycin sent to pharmacy  Encouraged to call us with significant concerns  Tentative follow-up in a year with Dr. Lamonte Sakai  May return to work February 1     Sherrilyn Rist MD [facility] 12/12/2022, 11:59 AM  CC: Gaynelle Arabian, MD

## 2022-12-12 NOTE — Patient Instructions (Signed)
Prescription for albuterol be sent to pharmacy  Prescription for azithromycin  Prescription for steroids  Work excuse for January 21 to allow recovery  Call with significant concerns

## 2022-12-12 NOTE — Telephone Encounter (Signed)
Not feeling well. Needs a Depo and pred shot, she says. No appts avail. Pls call to advise.

## 2022-12-12 NOTE — Telephone Encounter (Signed)
Spoke with pt who states she is having a productive cough with SOB after coughing fit. Pt states when she gets this way she needs a pred taper, depo shot and CXR. Pt was scheduled for Acute visit with Dr. Ander Slade today at 11:30. Nothing further needed at this time.   Routing to Dr. Ander Slade as Juluis Rainier

## 2022-12-19 ENCOUNTER — Ambulatory Visit: Payer: Medicare Other | Admitting: Surgery

## 2022-12-21 ENCOUNTER — Ambulatory Visit: Payer: Medicare Other | Admitting: Vascular Surgery

## 2022-12-21 ENCOUNTER — Telehealth: Payer: Self-pay | Admitting: Emergency Medicine

## 2022-12-22 NOTE — Telephone Encounter (Signed)
Called and spoke with patient. She stated that she was seen by AO on 12/12/22 for an asthma flare up. She was starting to feel better but she noticed that her chest tightness has returned. She is still on her prednisone at '20mg'$ . She has been using her albuterol frequently during the day. Still has a productive cough with clear phlegm.   She wanted to come on Monday for a breathing treatment. We currently do not have any openings on Monday. 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   She currently does not have a nebulizer machine or medication at home. I did offer to see if we could possibly order the machine and medicine for her, but due to her travelling, this may be difficult.   She wanted to know if AO had any other recommendations for her.   AO, can you please advise? Thanks!

## 2022-12-23 NOTE — Telephone Encounter (Signed)
Nebulizer with albuterol may be a good option  Nothing else to add at present as she just received a course of antibiotics and steroids.  A higher dose of steroids may not make much difference and the higher the dose,  higher the risk of side effects as she knows  I am open to giving another course of antibiotics, maybe doxycycline 100 p.o. twice daily for 10 days or Augmentin 875 twice daily for 10 days-in case there is still a persistent infection despite azithromycin that was just prescribed

## 2022-12-23 NOTE — Telephone Encounter (Signed)
Called and spoke with pt letting her know the info per Dr. Jenetta Downer. Pt said she would like to come in for a neb treatment as she is still coughing and wheezing.  Appt scheduled for pt with Dr. Verlee Monte 2/12 for an acute visit and note was documented that pt wants a neb treatment. Nothing further needed.

## 2022-12-24 NOTE — Progress Notes (Unsigned)
Synopsis: Referred for cough by Gaynelle Arabian, MD  Subjective:   PATIENT ID: Nicole [name] GENDER: female DOB: 1956-01-05, MRN: [id number]  Chief Complaint  Patient presents with   Acute Visit    Pt c/o cough and wheezing over the past 2 wks- cough is prod with thick, white sputum.    66yF with multifactorial chronic cough f/b Dr. 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 Ander Slade with acute cough/asthma exacerbation given rx for steroids, azithromycin, prn albuterol  Jittery with steroids. 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 Still coughing more than usual though.   Otherwise pertinent review of systems is negative.  Past Medical History:  Diagnosis Date   Anxiety    Arthritis    lumbar- HNP, /w myelopathy   Asthma    /w reflux , consult /w Dr. Wert-2013 / Jodi Mourning INHALER PRN INFREQUENTLY   Bronchitis    used albuterol , Sept. 2013, all clear now    Complication of anesthesia    Difficulty sleeping    DUE TO PAIN   Family history of anesthesia complication    N&V- Mother    GERD (gastroesophageal reflux disease)    Chron's disease, not medically treating currently    H/O hiatal hernia    small hiatal hernia, has had endoscopy & colondoscopy   H/O vertigo    Heart murmur    since birth   Hypertension    followed by Dr. Marisue Humble    Numbness in left leg    PONV (postoperative nausea and vomiting)    Stress incontinence      Family History  Problem Relation Age of Onset   Kidney disease Mother    Asthma Mother    Thyroid disease Mother    Glaucoma Mother    Diabetes Father    Stroke Father    Hypertension Father    Kidney disease Father    Hypertension Sister      Past Surgical History:  Procedure Laterality Date   BACK SURGERY  2013   CHOLECYSTECTOMY  03   laparoscopic   LUMBAR  LAMINECTOMY/DECOMPRESSION MICRODISCECTOMY  09/10/2012   Procedure: LUMBAR LAMINECTOMY/DECOMPRESSION MICRODISCECTOMY 1 LEVEL;  Surgeon: Kristeen Miss, MD;  Location: [facility];  Service: Neurosurgery;  Laterality: Left;  Left Lumbar Five-Sacral One Microdiscectomy   TOTAL HIP ARTHROPLASTY Left 12/24/2014   Procedure: LEFT TOTAL HIP ARTHROPLASTY ANTERIOR APPROACH;  Surgeon: Mauri Pole, MD;  Location: [facility];  Service: Orthopedics;  Laterality: Left;    Social History   Socioeconomic History   Marital status: Divorced    Spouse name: Not on file   Number of children: 0   Years of education: 16   Highest education level: Not on file  Occupational History   Occupation: RN  Tobacco Use   Smoking status: Former    Packs/day: 1.50    Years: 27.00    Total pack years: 40.50    Types: Cigarettes    Quit date: 11/15/1999    Years since quitting: 23.1    Passive exposure: Never   Smokeless tobacco: Never  Vaping Use   Vaping Use: Never used  Substance and Sexual Activity   Alcohol use: Yes    Comment: Seldom drinks, but occasional wine.   Drug use: No   Sexual activity: Not on file  Other Topics  Concern   Not on file  Social History Narrative   Lives with mom and dad in a one story home.  Has no children.  Works as a travel Marine scientist.  Education: college.   Social Determinants of Health   Financial Resource Strain: Not on file  Food Insecurity: Not on file  Transportation Needs: Not on file  Physical Activity: Not on file  Stress: Not on file  Social Connections: Not on file  Intimate Partner Violence: Not on file     Allergies  Allergen Reactions   Hctz [Hydrochlorothiazide] Other (See Comments)    MUSCLE CRAMPING   Hydralazine Hcl Other (See Comments)    Cramping   Metoclopramide Hcl Other (See Comments)    Cramping, anxiety and depression; "makes me crazy"   Other Other (See Comments)    Cramping   Tramadol Other (See Comments)    "wig out"   Voltaren [Diclofenac  Sodium] Other (See Comments)    Swelling in leg and became parched.    Bacitracin    Bacitracin-Polymyxin B     Other reaction(s): blisters   Diclofenac Sodium     Other reaction(s): Swelling/Dry mouth   Gabapentin     Other reaction(s): Lightheadedness   Polymyxin B    Avelox [Moxifloxacin Hcl In Nacl] Nausea And Vomiting   Clindamycin Rash   Clindamycin/Lincomycin Rash   Flagyl [Metronidazole] Rash   Moxifloxacin Rash   Neosporin [Neomycin-Bacitracin Zn-Polymyx] Other (See Comments)    Blisters, rash   Pristiq [Desvenlafaxine Succinate Er] Other (See Comments)    Intolerance.      Outpatient Medications Prior to Visit  Medication Sig Dispense Refill   acetaminophen (TYLENOL) 325 MG tablet Take 2 tablets (650 mg total) by mouth every 4 (four) hours as needed for up to 7 days 60 tablet 0   albuterol (VENTOLIN HFA) 108 (90 Base) MCG/ACT inhaler Inhale 2 puffs into the lungs every 6 hours as needed for wheezing or shortness of breath. 6.7 g 6   ALPRAZolam (XANAX) 0.5 MG tablet Take 1 tablet by mouth 3 times daily as needed (must last 90 days). 270 tablet 0   citalopram (CELEXA) 10 MG tablet Take 1 tablet by mouth everyday. (Patient taking differently: Take 20 mg by mouth daily.) 90 tablet 4   diltiazem (CARDIZEM CD) 180 MG 24 hr capsule Take 1 capsule (180 mg total) by mouth daily. 90 capsule 3   esomeprazole (NEXIUM) 40 MG capsule Take 40 mg by mouth 2 (two) times daily before a meal.   1   fluticasone (FLONASE) 50 MCG/ACT nasal spray Place 2 sprays into both nostrils daily as needed (For cold symptoms).     losartan (COZAAR) 100 MG tablet Take 1 tablet by mouth daily. 90 tablet 3   mesalamine (APRISO) 0.375 g 24 hr capsule Take 4 capsules by mouth every morning 120 capsule 5   mupirocin ointment (BACTROBAN) 2 % Apply a  small amount to affected area once a day with bandage changes 22 g 0   traZODone (DESYREL) 50 MG tablet Take 1/2-2 tablets by mouth at bedtime as needed 180 tablet 2    valACYclovir (VALTREX) 1000 MG tablet TAKE 2 TABLETS BY MOUTH 2 TIMES DAILY FOR 2 DAYS AT FIRST SIGN OF FEVER BLISTER 90 tablet 3   albuterol (VENTOLIN HFA) 108 (90 Base) MCG/ACT inhaler Inhale 2 puffs into the lungs every 4 (four) hours as needed. 8.5 g 2   ALPRAZolam (XANAX) 0.5 MG tablet Take 1 mg by mouth  as needed for anxiety or sleep.     predniSONE (DELTASONE) 10 MG tablet Take 2 tablets (20 mg total) by mouth daily with breakfast. 28 tablet 0   No facility-administered medications prior to visit.       Objective:   Physical Exam:  General appearance: 67 y.o., female, NAD, conversant  Eyes: anicteric sclerae; PERRL, tracking appropriately HENT: NCAT; MMM Neck: Trachea midline; no lymphadenopathy, no JVD Lungs: CTAB, no crackles, no wheeze, with normal respiratory effort CV: RRR, no murmur  Abdomen: Soft, non-tender; non-distended, BS present  Extremities: No peripheral edema, warm Skin: Normal turgor and texture; no rash Psych: Appropriate affect Neuro: Alert and oriented to person and place, no focal deficit     Vitals:   12/26/22 0900  BP: 114/60  Pulse: 74  Temp: 97.9 F (36.6 C)  TempSrc: Oral  SpO2: 96%  Weight: 187 lb 12.8 oz (85.2 kg)  Height: 5' 6"$  (1.676 m)   96% on RA BMI Readings from Last 3 Encounters:  12/26/22 30.31 kg/m  12/12/22 30.18 kg/m  09/19/22 30.17 kg/m   Wt Readings from Last 3 Encounters:  12/26/22 187 lb 12.8 oz (85.2 kg)  12/12/22 187 lb (84.8 kg)  09/19/22 186 lb 14.4 oz (84.8 kg)     CBC    Component Value Date/Time   WBC 13.5 (H) 02/12/2020 1131   RBC 4.50 02/12/2020 1131   HGB 13.9 02/12/2020 1131   HCT 42.2 02/12/2020 1131   PLT 289 02/12/2020 1131   MCV 93.8 02/12/2020 1131   MCH 30.9 02/12/2020 1131   MCHC 32.9 02/12/2020 1131   RDW 12.9 02/12/2020 1131   LYMPHSABS 2.1 03/16/2010 1740   MONOABS 0.5 03/16/2010 1740   EOSABS 0.0 03/16/2010 1740   BASOSABS 0.0 03/16/2010 1740    Chest Imaging: CT A/P  lung bases 07/20/21 reviewed by me unremarkable  Pulmonary Functions Testing Results:    Latest Ref Rng & Units 05/31/2021    3:03 PM 06/09/2015    3:04 PM  PFT Results  FVC-Pre L 2.67  2.87  P  FVC-Predicted Pre % 78  80  P  FVC-Post L 3.03  3.21  P  FVC-Predicted Post % 88  90  P  Pre FEV1/FVC % % 67  68  P  Post FEV1/FCV % % 68  67  P  FEV1-Pre L 1.79  1.96  P  FEV1-Predicted Pre % 68  71  P  FEV1-Post L 2.05  2.15  P  DLCO uncorrected ml/min/mmHg 22.15  24.53  P  DLCO UNC% % 104  91  P  DLCO corrected ml/min/mmHg 22.15    DLCO COR %Predicted % 104    DLVA Predicted % 105  82  P  TLC L 5.12  4.91  P  TLC % Predicted % 95  91  P  RV % Predicted % 99  100  P    P Preliminary result   PFT 2022 with mild obstruction, +BD response, normal diffusing capacity    Echocardiogram 09/2022:    1. Left ventricular ejection fraction, by estimation, is 70 to 75%. Left  ventricular ejection fraction by PLAX is 74 %. The left ventricle has  hyperdynamic function. The left ventricle has no regional wall motion  abnormalities. Left ventricular  diastolic parameters were normal.   2. Right ventricular systolic function is normal. The right ventricular  size is normal. Tricuspid regurgitation signal is inadequate for assessing  PA pressure.   3. The mitral valve  is abnormal. Trivial mitral valve regurgitation.   4. The aortic valve is tricuspid. Aortic valve regurgitation is not  visualized. Aortic valve sclerosis/calcification is present, without any  evidence of aortic stenosis. Aortic valve mean gradient measures 9.0 mmHg.  DI is 0.63.   5. The inferior vena cava is normal in size with greater than 50%  respiratory variability, suggesting right atrial pressure of 3 mmHg.       Assessment & Plan:   # Acute cough # Suspected uncontrolled persistent asthma/COPD  Plan: - x ray today, will call if anything out of the ordinary - breztri 2 puffs twice daily, rinse mouth and brush  tongue/teeth after use - will ask pharmacy what preferred ICS/LABA inhaler is based on your insurer for something to use long term at least till next clinic visit - fluconazole prescribed in case you develop thrush - see you in 3 months or sooner if need be!     Maryjane Hurter, MD [facility] 12/26/2022 9:04 AM

## 2022-12-26 ENCOUNTER — Ambulatory Visit: Payer: Medicare Other | Admitting: Student

## 2022-12-26 ENCOUNTER — Other Ambulatory Visit (HOSPITAL_COMMUNITY): Payer: Self-pay

## 2022-12-26 ENCOUNTER — Other Ambulatory Visit: Payer: Self-pay

## 2022-12-26 ENCOUNTER — Telehealth: Payer: Self-pay | Admitting: Student

## 2022-12-26 ENCOUNTER — Ambulatory Visit (INDEPENDENT_AMBULATORY_CARE_PROVIDER_SITE_OTHER): Payer: Medicare Other

## 2022-12-26 ENCOUNTER — Encounter: Payer: Self-pay | Admitting: Student

## 2022-12-26 VITALS — BP 114/60 | HR 74 | Temp 97.9°F | Ht 66.0 in | Wt 187.8 lb

## 2022-12-26 DIAGNOSIS — R059 Cough, unspecified: Secondary | ICD-10-CM | POA: Diagnosis not present

## 2022-12-26 DIAGNOSIS — R051 Acute cough: Secondary | ICD-10-CM

## 2022-12-26 DIAGNOSIS — J4489 Other specified chronic obstructive pulmonary disease: Secondary | ICD-10-CM | POA: Diagnosis not present

## 2022-12-26 MED ORDER — METHYLPREDNISOLONE ACETATE 80 MG/ML IJ SUSP
80.0000 mg | Freq: Once | INTRAMUSCULAR | Status: AC
  Administered 2022-12-26: 80 mg via INTRAMUSCULAR

## 2022-12-26 MED ORDER — FLUTICASONE-SALMETEROL 230-21 MCG/ACT IN AERO
1.0000 | INHALATION_SPRAY | Freq: Two times a day (BID) | RESPIRATORY_TRACT | 12 refills | Status: DC
  Filled 2022-12-26 – 2023-04-18 (×5): qty 12, 30d supply, fill #0

## 2022-12-26 MED ORDER — IPRATROPIUM-ALBUTEROL 0.5-2.5 (3) MG/3ML IN SOLN
3.0000 mL | Freq: Once | RESPIRATORY_TRACT | Status: AC
  Administered 2022-12-26: 3 mL via RESPIRATORY_TRACT

## 2022-12-26 MED ORDER — FLUCONAZOLE 100 MG PO TABS
50.0000 mg | ORAL_TABLET | Freq: Every day | ORAL | 0 refills | Status: DC
  Filled 2022-12-26: qty 7, 14d supply, fill #0

## 2022-12-26 NOTE — Telephone Encounter (Signed)
Can you let her know rx for advair 1 puff twice daily [facility]?  Thanks!

## 2022-12-26 NOTE — Telephone Encounter (Signed)
Cheapest covered ICS/LABA options for this patient at this time are the Georgette Shell and Advair HFA for $47.00 each

## 2022-12-26 NOTE — Patient Instructions (Addendum)
-   x ray today, will call if anything out of the ordinary - breztri 2 puffs twice daily, rinse mouth and brush tongue/teeth after use - will ask pharmacy what preferred ICS/LABA inhaler is based on your insurer for something to use long term at least till next clinic visit - fluconazole prescribed in case you develop thrush - see you in 3 months or sooner if need be!

## 2022-12-26 NOTE — Telephone Encounter (Signed)
What is least expensive laba/ics for her?  Thanks!

## 2022-12-27 ENCOUNTER — Other Ambulatory Visit: Payer: Self-pay

## 2022-12-27 NOTE — Telephone Encounter (Signed)
I called and spoke with the pt and notified of response per Dr Verlee Monte  She verbalized understanding  Nothing further needed

## 2022-12-29 ENCOUNTER — Other Ambulatory Visit: Payer: Self-pay

## 2022-12-31 DIAGNOSIS — H16141 Punctate keratitis, right eye: Secondary | ICD-10-CM | POA: Diagnosis not present

## 2023-01-02 ENCOUNTER — Other Ambulatory Visit (HOSPITAL_COMMUNITY): Payer: Self-pay

## 2023-01-03 ENCOUNTER — Other Ambulatory Visit (HOSPITAL_COMMUNITY): Payer: Self-pay

## 2023-01-03 MED ORDER — CITALOPRAM HYDROBROMIDE 20 MG PO TABS
20.0000 mg | ORAL_TABLET | Freq: Every day | ORAL | 0 refills | Status: DC
  Filled 2023-01-03: qty 90, 90d supply, fill #0

## 2023-01-11 ENCOUNTER — Encounter: Payer: Self-pay | Admitting: Vascular Surgery

## 2023-01-11 ENCOUNTER — Ambulatory Visit: Payer: Medicare Other | Admitting: Vascular Surgery

## 2023-01-11 ENCOUNTER — Other Ambulatory Visit (HOSPITAL_COMMUNITY): Payer: Self-pay

## 2023-01-11 VITALS — BP 113/62 | HR 56 | Temp 98.0°F | Resp 16 | Ht 66.0 in | Wt 188.3 lb

## 2023-01-11 DIAGNOSIS — M7989 Other specified soft tissue disorders: Secondary | ICD-10-CM

## 2023-01-11 DIAGNOSIS — I872 Venous insufficiency (chronic) (peripheral): Secondary | ICD-10-CM | POA: Diagnosis not present

## 2023-01-11 DIAGNOSIS — I8393 Asymptomatic varicose veins of bilateral lower extremities: Secondary | ICD-10-CM

## 2023-01-11 NOTE — Progress Notes (Signed)
REASON FOR VISIT:   Follow-up of chronic venous insufficiency  MEDICAL ISSUES:   CHRONIC VENOUS INSUFFICIENCY: This patient has both some deep venous reflux and superficial venous reflux on the left.  We have discussed the importance of daily leg elevation and the proper positioning for this.  Her thigh-high stockings roll down so we have fitted her for some knee-high stockings with a gradient of 20 to 30 mmHg.  I have encouraged her to avoid prolonged sitting and standing.  We have discussed importance of exercise specifically walking and water aerobics.  We also discussed importance of maintaining a healthy weight.  If her symptoms progress, I think she would be a candidate for laser ablation of the left great saphenous vein down to the proximal calf.  She will start with more aggressive leg elevation and with the new stockings and call if her symptoms do not improve significantly.   HPI:   Nicole [name] is a pleasant 67 y.o. female who was seen by Karoline Caldwell, PA on 09/19/2022 with leg swelling.  She stated that she had swelling for many years.  She also developed some intermittent erythema more so on the left leg.  She has had prior sclerotherapy many years ago.  She has had no other vein procedures.  Her venous reflux study of the left lower extremity at that time showed both deep and superficial venous reflux.  She was seen encouraged to elevate her legs and exercise.  She was fitted for thigh-high compression stockings with a gradient of 20 to 30 mmHg.  She comes in for 61-monthfollow-up visit.  Since she was seen last she is continuing to have some aching pain in heaviness in both legs aggravated by sitting and standing and relieved with elevation.  She has been trying to wear her thigh-high compression stockings but they roll down.  She continues to have some problems with swelling which is worse at the end of the day.  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  She has no  previous history of disease.  She has had no previous venous procedures.  Past Medical History:  Diagnosis Date   Anxiety    Arthritis    lumbar- HNP, /w myelopathy   Asthma    /w reflux , consult /w Dr. Wert-2013 / UJodi MourningINHALER PRN INFREQUENTLY   Bronchitis    used albuterol , Sept. 2013, all clear now    Complication of anesthesia    Difficulty sleeping    DUE TO PAIN   Family history of anesthesia complication    N&V- Mother    GERD (gastroesophageal reflux disease)    Chron's disease, not medically treating currently    H/O hiatal hernia    small hiatal hernia, has had endoscopy & colondoscopy   H/O vertigo    Heart murmur    since birth   Hypertension    followed by Dr. EMarisue Humble   Numbness in left leg    PONV (postoperative nausea and vomiting)    Stress incontinence     Family History  Problem Relation Age of Onset   Kidney disease Mother    Asthma Mother    Thyroid disease Mother    Glaucoma Mother    Diabetes Father    Stroke Father    Hypertension Father    Kidney disease Father    Hypertension Sister     SOCIAL HISTORY: Social History   Tobacco Use  Smoking status: Former    Packs/day: 1.50    Years: 27.00    Total pack years: 40.50    Types: Cigarettes    Quit date: 11/15/1999    Years since quitting: 23.1    Passive exposure: Never   Smokeless tobacco: Never  Substance Use Topics   Alcohol use: Yes    Comment: Seldom drinks, but occasional wine.    Allergies  Allergen Reactions   Hctz [Hydrochlorothiazide] Other (See Comments)    MUSCLE CRAMPING   Hydralazine Hcl Other (See Comments)    Cramping   Metoclopramide Hcl Other (See Comments)    Cramping, anxiety and depression; "makes me crazy"   Other Other (See Comments)    Cramping   Tramadol Other (See Comments)    "wig out"   Voltaren [Diclofenac Sodium] Other (See Comments)    Swelling in leg and became parched.    Bacitracin    Bacitracin-Polymyxin B     Other reaction(s):  blisters   Diclofenac Sodium     Other reaction(s): Swelling/Dry mouth   Gabapentin     Other reaction(s): Lightheadedness   Polymyxin B    Avelox [Moxifloxacin Hcl In Nacl] Nausea And Vomiting   Clindamycin Rash   Clindamycin/Lincomycin Rash   Flagyl [Metronidazole] Rash   Moxifloxacin Rash   Neosporin [Neomycin-Bacitracin Zn-Polymyx] Other (See Comments)    Blisters, rash   Pristiq [Desvenlafaxine Succinate Er] Other (See Comments)    Intolerance.     Current Outpatient Medications  Medication Sig Dispense Refill   acetaminophen (TYLENOL) 325 MG tablet Take 2 tablets (650 mg total) by mouth every 4 (four) hours as needed for up to 7 days 60 tablet 0   albuterol (VENTOLIN HFA) 108 (90 Base) MCG/ACT inhaler Inhale 2 puffs into the lungs every 6 hours as needed for wheezing or shortness of breath. 6.7 g 6   ALPRAZolam (XANAX) 0.5 MG tablet Take 1 tablet by mouth 3 times daily as needed (must last 90 days). 270 tablet 0   citalopram (CELEXA) 10 MG tablet Take 1 tablet by mouth everyday. (Patient taking differently: Take 20 mg by mouth daily.) 90 tablet 4   citalopram (CELEXA) 20 MG tablet Take 1 tablet (20 mg total) by mouth daily. 90 tablet 0   diltiazem (CARDIZEM CD) 180 MG 24 hr capsule Take 1 capsule (180 mg total) by mouth daily. 90 capsule 3   esomeprazole (NEXIUM) 40 MG capsule Take 40 mg by mouth 2 (two) times daily before a meal.   1   fluconazole (DIFLUCAN) 100 MG tablet Take 0.5 tablets (50 mg total) by mouth daily. 7 tablet 0   fluticasone (FLONASE) 50 MCG/ACT nasal spray Place 2 sprays into both nostrils daily as needed (For cold symptoms).     fluticasone-salmeterol (ADVAIR HFA) 230-21 MCG/ACT inhaler Inhale 1 puff into the lungs 2 (two) times daily. 12 g 12   losartan (COZAAR) 100 MG tablet Take 1 tablet by mouth daily. 90 tablet 3   mesalamine (APRISO) 0.375 g 24 hr capsule Take 4 capsules by mouth every morning 120 capsule 5   mupirocin ointment (BACTROBAN) 2 % Apply a   small amount to affected area once a day with bandage changes 22 g 0   traZODone (DESYREL) 50 MG tablet Take 1/2-2 tablets by mouth at bedtime as needed 180 tablet 2   valACYclovir (VALTREX) 1000 MG tablet TAKE 2 TABLETS BY MOUTH 2 TIMES DAILY FOR 2 DAYS AT FIRST SIGN OF FEVER BLISTER 90 tablet 3  No current facility-administered medications for this visit.    REVIEW OF SYSTEMS:  '[X]'$  denotes positive finding, '[ ]'$  denotes negative finding Cardiac  Comments:  Chest pain or chest pressure:    Shortness of breath upon exertion:    Short of breath when lying flat:    Irregular heart rhythm:        Vascular    Pain in calf, thigh, or hip brought on by ambulation:    Pain in feet at night that wakes you up from your sleep:     Blood clot in your veins:    Leg swelling:  x       Pulmonary    Oxygen at home:    Productive cough:     Wheezing:         Neurologic    Sudden weakness in arms or legs:     Sudden numbness in arms or legs:     Sudden onset of difficulty speaking or slurred speech:    Temporary loss of vision in one eye:     Problems with dizziness:         Gastrointestinal    Blood in stool:     Vomited blood:         Genitourinary    Burning when urinating:     Blood in urine:        Psychiatric    Major depression:         Hematologic    Bleeding problems:    Problems with blood clotting too easily:        Skin    Rashes or ulcers:        Constitutional    Fever or chills:     PHYSICAL EXAM:   Vitals:   01/11/23 1552  BP: 113/62  Pulse: (!) 56  Resp: 16  Temp: 98 F (36.7 C)  TempSrc: Temporal  SpO2: 97%  Weight: 188 lb 4.8 oz (85.4 kg)  Height: '5\' 6"'$  (1.676 m)    GENERAL: The patient is a well-nourished female, in no acute distress. The vital signs are documented above. CARDIAC: There is a regular rate and rhythm.  VASCULAR: I do not detect carotid bruits. She has a palpable right dorsalis pedis pulse I cannot palpate a left dorsalis pedis  pulse. She has biphasic posterior tibial signals bilaterally.  She has a biphasic dorsalis pedis on the right and a monophasic dorsalis pedis on the left.  She does have a biphasic anterior tibial signal on the left. She has some small varicose veins and telangiectasias bilaterally.  She has bilateral lower extremity swelling. I did look at her left great saphenous vein myself with the SonoSite.  She has reflux down to the proximal calf.  The diameters of the vein ranged from 4 to 5 mm throughout. PULMONARY: There is good air exchange bilaterally without wheezing or rales. ABDOMEN: Soft and non-tender with normal pitched bowel sounds.  MUSCULOSKELETAL: There are no major deformities or cyanosis. NEUROLOGIC: No focal weakness or paresthesias are detected. SKIN: There are no ulcers or rashes noted. PSYCHIATRIC: The patient has a normal affect.  DATA:    VENOUS DUPLEX: I have independently reviewed the venous duplex scan that was done on 09/19/2022.  This was of the left lower extremity only.  There was no evidence of DVT.  There was deep venous reflux in the femoral vein.  There was superficial venous reflux in the left great saphenous vein.  Diameters of the vein ranged  from 4-5 mm.  The results of this test are summarized in the diagram below.    A total of 40 minutes was spent on this visit. 20 minutes was face to face time. More than 50% of the time was spent on counseling and coordinating with the patient.    Deitra Mayo [facility] [phone]

## 2023-01-16 ENCOUNTER — Other Ambulatory Visit (HOSPITAL_COMMUNITY): Payer: Self-pay

## 2023-01-16 DIAGNOSIS — J309 Allergic rhinitis, unspecified: Secondary | ICD-10-CM | POA: Diagnosis not present

## 2023-01-16 DIAGNOSIS — R059 Cough, unspecified: Secondary | ICD-10-CM | POA: Diagnosis not present

## 2023-01-17 ENCOUNTER — Other Ambulatory Visit (HOSPITAL_COMMUNITY): Payer: Self-pay

## 2023-01-17 MED ORDER — AZELASTINE HCL 0.1 % NA SOLN
1.0000 | Freq: Two times a day (BID) | NASAL | 0 refills | Status: AC | PRN
  Filled 2023-01-17: qty 30, 25d supply, fill #0

## 2023-01-18 ENCOUNTER — Other Ambulatory Visit (HOSPITAL_COMMUNITY): Payer: Self-pay

## 2023-01-24 DIAGNOSIS — Z130 Encounter for screening for diseases of the blood and blood-forming organs and certain disorders involving the immune mechanism: Secondary | ICD-10-CM | POA: Diagnosis not present

## 2023-01-24 DIAGNOSIS — Z0000 Encounter for general adult medical examination without abnormal findings: Secondary | ICD-10-CM | POA: Diagnosis not present

## 2023-01-24 DIAGNOSIS — J449 Chronic obstructive pulmonary disease, unspecified: Secondary | ICD-10-CM | POA: Diagnosis not present

## 2023-01-24 DIAGNOSIS — I10 Essential (primary) hypertension: Secondary | ICD-10-CM | POA: Diagnosis not present

## 2023-01-24 DIAGNOSIS — E7800 Pure hypercholesterolemia, unspecified: Secondary | ICD-10-CM | POA: Diagnosis not present

## 2023-01-24 DIAGNOSIS — K2100 Gastro-esophageal reflux disease with esophagitis, without bleeding: Secondary | ICD-10-CM | POA: Diagnosis not present

## 2023-01-24 DIAGNOSIS — I872 Venous insufficiency (chronic) (peripheral): Secondary | ICD-10-CM | POA: Diagnosis not present

## 2023-01-24 DIAGNOSIS — K5010 Crohn's disease of large intestine without complications: Secondary | ICD-10-CM | POA: Diagnosis not present

## 2023-01-24 DIAGNOSIS — I700 Atherosclerosis of aorta: Secondary | ICD-10-CM | POA: Diagnosis not present

## 2023-02-03 DIAGNOSIS — M25561 Pain in right knee: Secondary | ICD-10-CM | POA: Diagnosis not present

## 2023-02-06 ENCOUNTER — Other Ambulatory Visit (HOSPITAL_COMMUNITY): Payer: Self-pay

## 2023-02-09 ENCOUNTER — Other Ambulatory Visit: Payer: Self-pay

## 2023-02-17 ENCOUNTER — Other Ambulatory Visit (HOSPITAL_COMMUNITY): Payer: Self-pay

## 2023-02-17 ENCOUNTER — Telehealth: Payer: Self-pay | Admitting: Interventional Cardiology

## 2023-02-17 MED ORDER — DILTIAZEM HCL ER COATED BEADS 180 MG PO CP24
180.0000 mg | ORAL_CAPSULE | Freq: Every day | ORAL | 0 refills | Status: DC
  Filled 2023-02-17 – 2023-04-12 (×4): qty 90, 90d supply, fill #0

## 2023-02-17 MED ORDER — LOSARTAN POTASSIUM 100 MG PO TABS
100.0000 mg | ORAL_TABLET | Freq: Every day | ORAL | 0 refills | Status: DC
  Filled 2023-02-17: qty 90, fill #0
  Filled 2023-04-11 – 2023-04-12 (×2): qty 90, 90d supply, fill #0

## 2023-02-17 NOTE — Telephone Encounter (Signed)
*  STAT* If patient is at the pharmacy, call can be transferred to refill team.   1. Which medications need to be refilled? (please list name of each medication and dose if known)   diltiazem (CARDIZEM CD) 180 MG 24 hr capsule    losartan (COZAAR) 100 MG tablet   2. Which pharmacy/location (including street and city if local pharmacy) is medication to be sent to?  [place] [facility]    3. Do they need a 30 day or 90 day supply? 90 day

## 2023-02-17 NOTE — Telephone Encounter (Signed)
Pt's medications were sent to pt's pharmacy as requested. Confirmation received.  

## 2023-03-06 ENCOUNTER — Other Ambulatory Visit: Payer: Self-pay

## 2023-03-07 DIAGNOSIS — M25561 Pain in right knee: Secondary | ICD-10-CM | POA: Diagnosis not present

## 2023-03-11 ENCOUNTER — Other Ambulatory Visit (HOSPITAL_COMMUNITY): Payer: Self-pay

## 2023-03-11 DIAGNOSIS — M25561 Pain in right knee: Secondary | ICD-10-CM | POA: Diagnosis not present

## 2023-03-13 ENCOUNTER — Other Ambulatory Visit: Payer: Self-pay

## 2023-03-13 ENCOUNTER — Other Ambulatory Visit (HOSPITAL_COMMUNITY): Payer: Self-pay

## 2023-03-13 MED ORDER — MESALAMINE ER 0.375 G PO CP24
1.5000 g | ORAL_CAPSULE | Freq: Every morning | ORAL | 3 refills | Status: DC
  Filled 2023-03-13: qty 120, 30d supply, fill #0

## 2023-03-16 ENCOUNTER — Other Ambulatory Visit (HOSPITAL_COMMUNITY): Payer: Self-pay

## 2023-03-16 MED ORDER — CLOBETASOL PROPIONATE 0.05 % EX OINT
TOPICAL_OINTMENT | CUTANEOUS | 0 refills | Status: DC
  Filled 2023-03-16: qty 30, 16d supply, fill #0
  Filled 2023-03-16: qty 15, 6d supply, fill #0
  Filled 2023-03-16: qty 45, 22d supply, fill #0

## 2023-03-17 ENCOUNTER — Other Ambulatory Visit (HOSPITAL_COMMUNITY): Payer: Self-pay

## 2023-03-17 ENCOUNTER — Other Ambulatory Visit: Payer: Self-pay

## 2023-03-21 DIAGNOSIS — S20461A Insect bite (nonvenomous) of right back wall of thorax, initial encounter: Secondary | ICD-10-CM | POA: Diagnosis not present

## 2023-03-22 ENCOUNTER — Other Ambulatory Visit (HOSPITAL_COMMUNITY): Payer: Self-pay

## 2023-04-08 DIAGNOSIS — J4521 Mild intermittent asthma with (acute) exacerbation: Secondary | ICD-10-CM | POA: Diagnosis not present

## 2023-04-08 DIAGNOSIS — R051 Acute cough: Secondary | ICD-10-CM | POA: Diagnosis not present

## 2023-04-11 ENCOUNTER — Other Ambulatory Visit (HOSPITAL_COMMUNITY): Payer: Self-pay

## 2023-04-12 ENCOUNTER — Encounter: Payer: Self-pay | Admitting: Internal Medicine

## 2023-04-12 ENCOUNTER — Ambulatory Visit: Payer: Medicare Other | Admitting: Internal Medicine

## 2023-04-12 ENCOUNTER — Other Ambulatory Visit: Payer: Self-pay

## 2023-04-12 ENCOUNTER — Other Ambulatory Visit (HOSPITAL_COMMUNITY): Payer: Self-pay

## 2023-04-12 VITALS — BP 142/80 | HR 70 | Temp 98.0°F | Ht 66.0 in | Wt 181.2 lb

## 2023-04-12 DIAGNOSIS — J45901 Unspecified asthma with (acute) exacerbation: Secondary | ICD-10-CM

## 2023-04-12 DIAGNOSIS — J069 Acute upper respiratory infection, unspecified: Secondary | ICD-10-CM

## 2023-04-12 MED ORDER — ALBUTEROL SULFATE (2.5 MG/3ML) 0.083% IN NEBU
2.5000 mg | INHALATION_SOLUTION | Freq: Four times a day (QID) | RESPIRATORY_TRACT | 5 refills | Status: AC | PRN
  Filled 2023-04-12: qty 75, 7d supply, fill #0
  Filled 2023-04-12: qty 90, 8d supply, fill #0
  Filled 2023-05-24: qty 75, 7d supply, fill #0
  Filled 2024-03-27: qty 75, 7d supply, fill #1

## 2023-04-12 MED ORDER — CHLORPHENIRAMINE MALEATE 4 MG PO TABS
4.0000 mg | ORAL_TABLET | Freq: Two times a day (BID) | ORAL | 0 refills | Status: DC
Start: 2023-04-12 — End: 2024-01-26
  Filled 2023-04-12: qty 14, 7d supply, fill #0

## 2023-04-12 MED ORDER — CITALOPRAM HYDROBROMIDE 20 MG PO TABS
20.0000 mg | ORAL_TABLET | Freq: Every day | ORAL | 0 refills | Status: AC
  Filled 2023-04-12 (×2): qty 90, 90d supply, fill #0

## 2023-04-12 NOTE — Patient Instructions (Addendum)
Follow up with Dr. Delton Coombes in the next month or two.   Finish prednisone taper.  Will get you nebulizer treatments for home use. Take up to 4 times daily as needed.  Need to increase breztri to 2 puffs twice a day, gargle after use.   Start taking clorpheniramine twice daily for the next two weeks. This will dry up the post nasal drainage. Then switch to a daily anti-histamine like xyzal or zyrtec once a day.   Continue astelin and flonase nasal sprays (one in the morning, one at night) Astelin/Flonase - 1 spray on each side of your nose twice a day for first week, then 1 spray on each side.   Instructions for use: If you also use a saline nasal spray or rinse, use that first. Position the head with the chin slightly tucked. Use the right hand to spray into the left nostril and the right hand to spray into the left nostril.   Point the bottle away from the septum of your nose (cartilage that divides the two sides of your nose).  Hold the nostril closed on the opposite side from where you will spray Spray once and gently sniff to pull the medicine into the higher parts of your nose.  Don't sniff too hard as the medicine will drain down the back of your throat instead. Repeat with a second spray on the same side if prescribed. Repeat on the other side of your nose.

## 2023-04-12 NOTE — Progress Notes (Signed)
Nicole [name]    [id number]    June 25, 1956  Primary Care Physician:Nicole [name], Nicole Maduro, MD Date of Appointment: 04/12/2023 Established Patient Visit  Chief complaint:   Chief Complaint  Patient presents with   Follow-up    Pt has tested for flu and covid ( neg) pt has been sick since the 23rd.      HPI: Nicole [name] is 67 y.o. woman with asthma and chronic cough. Patient of Dr. Delton [name] but last seen by him in 2022. Has seen Nicole [name], Dr. Thora [name] and Dr. Aldean [name] in the interim.  She has recurrent bronchitis. And has a hard time recovering from URIs.    Symptoms started when she had a sick contact at work. Had worsening wheezing, shortness of breath. She went to urgent   She is currently on a prednisone taper obtained through urgent care.   She is currently prescribed. Breztri 1 puff twice a day - she isn't able to get in more than 1 puff BID.   For allergic rhinitis is on flonase, astelin. She is not taking any anti-histamines over the counter.    FeNO 45 ppb in June 2023.   Interval Updates: Here for acute visit for asthma exacerbation. Symptoms started 6 days ago.   I have reviewed the patient's family social and past medical history and updated as appropriate.   Past Medical History:  Diagnosis Date   Anxiety    Arthritis    lumbar- HNP, /w myelopathy   Asthma    /w reflux , consult /w Nicole [name]-2013 / Nicole [name] INHALER PRN INFREQUENTLY   Bronchitis    used albuterol , Sept. 2013, all clear now    Complication of anesthesia    Difficulty sleeping    DUE TO PAIN   Family history of anesthesia complication    N&V- Mother    GERD (gastroesophageal reflux disease)    Chron's disease, not medically treating currently    H/O hiatal hernia    small hiatal hernia, has had endoscopy & colondoscopy   H/O vertigo    Heart murmur    since birth   Hypertension    followed by Dr. Manus [name]    Numbness in left leg    PONV (postoperative nausea and vomiting)    Stress  incontinence     Past Surgical History:  Procedure Laterality Date   BACK SURGERY  2013   CHOLECYSTECTOMY  03   laparoscopic   LUMBAR LAMINECTOMY/DECOMPRESSION MICRODISCECTOMY  09/10/2012   Procedure: LUMBAR LAMINECTOMY/DECOMPRESSION MICRODISCECTOMY 1 LEVEL;  Surgeon: Nicole Abu, MD;  Location: [facility];  Service: Neurosurgery;  Laterality: Left;  Left Lumbar Five-Sacral One Microdiscectomy   TOTAL HIP ARTHROPLASTY Left 12/24/2014   Procedure: LEFT TOTAL HIP ARTHROPLASTY ANTERIOR APPROACH;  Surgeon: Nicole Pal, MD;  Location: [facility];  Service: Orthopedics;  Laterality: Left;    Family History  Problem Relation Age of Onset   Kidney disease Mother    Asthma Mother    Thyroid disease Mother    Glaucoma Mother    Diabetes Father    Stroke Father    Hypertension Father    Kidney disease Father    Hypertension Sister     Social History   Occupational History   Occupation: Charity fundraiser  Tobacco Use   Smoking status: Former    Packs/day: 1.50    Years: 27.00    Additional pack years: 0.00    Total pack years: 40.50    Types:  Cigarettes    Quit date: 11/15/1999    Years since quitting: 23.4    Passive exposure: Never   Smokeless tobacco: Never  Vaping Use   Vaping Use: Never used  Substance and Sexual Activity   Alcohol use: Yes    Comment: Seldom drinks, but occasional wine.   Drug use: No   Sexual activity: Not on file     Physical Exam: Blood pressure (!) 142/80, pulse 70, temperature 98 F (36.7 C), temperature source Oral, height 5\' 6"  (1.676 m), weight 181 lb 3.2 oz (82.2 kg), SpO2 98 %.  Gen:      No acute distress ENT:  no nasal polyps, mucus membranes moist +cobblestoning, nasal debris Lungs:    end expiratory wheezes, frequent coughing, improved with neb in office.  CV:         Regular rate and rhythm; no murmurs, rubs, or gallops.  No pedal edema   Data Reviewed: Imaging: I have personally reviewed the chest xray Feb 2024 shows no acute  process  PFTs:     Latest Ref Rng & Units 05/31/2021    3:03 PM 06/09/2015    3:04 PM  PFT Results  FVC-Pre L 2.67  2.87  P  FVC-Predicted Pre % 78  80  P  FVC-Post L 3.03  3.21  P  FVC-Predicted Post % 88  90  P  Pre FEV1/FVC % % 67  68  P  Post FEV1/FCV % % 68  67  P  FEV1-Pre L 1.79  1.96  P  FEV1-Predicted Pre % 68  71  P  FEV1-Post L 2.05  2.15  P  DLCO uncorrected ml/min/mmHg 22.15  24.53  P  DLCO UNC% % 104  91  P  DLCO corrected ml/min/mmHg 22.15    DLCO COR %Predicted % 104    DLVA Predicted % 105  82  P  TLC L 5.12  4.91  P  TLC % Predicted % 95  91  P  RV % Predicted % 99  100  P    P Preliminary result   I have personally reviewed the patient's PFTs and mild airflow limitation with BD response  Labs:  Immunization status: Immunization History  Administered Date(s) Administered   Influenza Split 08/31/2016, 08/09/2018, 08/07/2019   Influenza Whole 07/16/2011   Influenza,inj,Quad PF,6+ Mos 08/14/2015, 08/23/2017, 08/05/2018   Influenza,inj,quad, With Preservative 08/05/2018   Influenza-Unspecified 09/11/2020   PFIZER(Purple Top)SARS-COV-2 Vaccination 11/07/2019, 12/01/2019, 09/03/2020   PPD Test 08/14/2018   Tdap 07/26/2011   Zoster, Live 12/11/2015    External Records Personally Reviewed: pulmonary,   Assessment:  Moderate persistent asthma with acute exacerbation Post nasal drainage  Plan/Recommendations: Neb given in office today. Improvement in symptoms.   Finish prednisone taper prescribed by urgent care.  Will get you nebulizer treatments for home use. She has an old nebulizer machine that works. Take up to 4 times daily as needed.  Need to increase breztri to 2 puffs twice a day, gargle after use. She has only been on once daily - probable suboptimal steroid administration.   Start taking clorpheniramine twice daily for the next two weeks. This will dry up the post nasal drainage.  Then switch to a daily anti-histamine like xyzal or zyrtec  once a day. Holding off on narcotic cough syrup since she's already on trazodone and benzos.   Continue astelin and flonase nasal sprays (one in the morning, one at night)  Suspect the chronic cough is less related to  the asthma and more related to PND.  Return to Care: Follow up with Dr. Delton [name] in the next month or two.    Durel Salts, MD Pulmonary and Critical Care Medicine [facility] [phone]

## 2023-04-13 ENCOUNTER — Other Ambulatory Visit: Payer: Self-pay

## 2023-04-17 ENCOUNTER — Encounter: Payer: Self-pay | Admitting: Physician Assistant

## 2023-04-17 ENCOUNTER — Ambulatory Visit: Payer: Medicare Other | Attending: Physician Assistant | Admitting: Physician Assistant

## 2023-04-17 VITALS — BP 122/64 | HR 61 | Ht 66.0 in | Wt 183.6 lb

## 2023-04-17 DIAGNOSIS — I700 Atherosclerosis of aorta: Secondary | ICD-10-CM

## 2023-04-17 DIAGNOSIS — I358 Other nonrheumatic aortic valve disorders: Secondary | ICD-10-CM | POA: Insufficient documentation

## 2023-04-17 DIAGNOSIS — I10 Essential (primary) hypertension: Secondary | ICD-10-CM | POA: Diagnosis not present

## 2023-04-17 MED ORDER — ASPIRIN 81 MG PO TBEC: DELAYED_RELEASE_TABLET | ORAL | 12 refills | Status: AC

## 2023-04-17 MED ORDER — LOSARTAN POTASSIUM 100 MG PO TABS
100.0000 mg | ORAL_TABLET | Freq: Every day | ORAL | 3 refills | Status: DC
  Filled 2023-04-17 – 2023-07-05 (×3): qty 90, 90d supply, fill #0
  Filled 2023-10-04: qty 90, 90d supply, fill #1
  Filled 2023-12-18: qty 13, 13d supply, fill #2
  Filled 2023-12-18: qty 77, 77d supply, fill #2
  Filled 2024-04-01: qty 90, 90d supply, fill #3

## 2023-04-17 MED ORDER — DILTIAZEM HCL ER COATED BEADS 180 MG PO CP24
180.0000 mg | ORAL_CAPSULE | Freq: Every day | ORAL | 3 refills | Status: DC
  Filled 2023-04-17 – 2023-07-05 (×3): qty 90, 90d supply, fill #0
  Filled 2023-10-04: qty 90, 90d supply, fill #1
  Filled 2023-12-18: qty 90, 90d supply, fill #2
  Filled 2024-04-01: qty 90, 90d supply, fill #3

## 2023-04-17 NOTE — Patient Instructions (Signed)
Medication Instructions:   Your physician recommends that you continue on your current medications as directed. Please refer to the Current Medication list given to you today.   *If you need a refill on your cardiac medications before your next appointment, please call your pharmacy*   Lab Work:  Return in 6 months  Lipids   If you have labs (blood work) drawn today and your tests are completely normal, you will receive your results only by: MyChart Message (if you have MyChart) OR A paper copy in the mail If you have any lab test that is abnormal or we need to change your treatment, we will call you to review the results.   Testing/Procedures: NONE ORDERED  TODAY     Follow-Up: At [facility], you and your health needs are our priority.  As part of our continuing mission to provide you with exceptional heart care, we have created designated Provider Care Teams.  These Care Teams include your primary Cardiologist (physician) and Advanced Practice Providers (APPs -  Physician Assistants and Nurse Practitioners) who all work together to provide you with the care you need, when you need it.  We recommend signing up for the patient portal called "MyChart".  Sign up information is provided on this After Visit Summary.  MyChart is used to connect with patients for Virtual Visits (Telemedicine).  Patients are able to view lab/test results, encounter notes, upcoming appointments, etc.  Non-urgent messages can be sent to your provider as well.   To learn more about what you can do with MyChart, go to [email or web address].    Your next appointment:   1 year(s)  Provider:  Dr Excell Seltzer      Other Instructions

## 2023-04-17 NOTE — Assessment & Plan Note (Signed)
Systolic murmur heard on exam.  However, echocardiogram in November 2023 demonstrated AV sclerosis.  No further workup needed at this time.

## 2023-04-17 NOTE — Assessment & Plan Note (Addendum)
Noted on prior CT scan.  Continue aspirin 81 mg 3 days a week.  She notes that she will not take a statin.  Her labs from primary care were reviewed via [facility].  Her LDL was 102 in March 2024.  I would aim for a goal of less than 70 for LDL.  She will continue to try to work on diet.  I recommended a plant-based, whole food, high-fiber diet.  She hopes to get back to exercising soon.  Arrange fasting lipids in 6 months.  If LDL remains above 70, consider adding ezetimibe.

## 2023-04-17 NOTE — Progress Notes (Signed)
Cardiology Office Note:    Date:  04/17/2023  ID:  Nicole [name], DOB 1956-06-24, MRN [id number] PCP: Blair Heys, MD  [facility] Providers Cardiologist:  Tonny Bollman, MD Cardiology APP:  Beatrice Lecher, PA-C       Patient Profile:      Hypertension  TTE 09/19/2022: EF 70-75, no RWMA, normal RVSF, trivial MR, AV sclerosis, RAP 3 Asthma Aortic atherosclerosis  Obesity Crohn's disease       History of Present Illness:   Nicole [name] is a 67 y.o. female who returns for f/u of HTN. She was last seen by Dr. Katrinka [name] 04/07/22.  She is here alone.  She just finished taking prednisone for an asthma flare.  She has been struggling with some knee issues and has not been able to exercise as much as she would like.  She has not had chest pain or shortness of breath.  She has not had syncope.  She did note an episode of rapid palpitations a few days ago.  This was while she was on prednisone.  She did a vagal maneuver with resolution of her symptoms.  Of note, she requested to follow-up with Dr. Excell [name] after Dr. Michaelle [name] retirement.  Review of Systems  Cardiovascular:  Negative for syncope.  Hematologic/Lymphatic: Does not bruise/bleed easily.   See HPI    Studies Reviewed:    EKG: NSR, HR 61, normal axis, no ST-T wave changes, QTc 424  Risk Assessment/Calculations:             Physical Exam:   VS:  BP 122/64   Pulse 61   Ht 5\' 6"  (1.676 m)   Wt 183 lb 9.6 oz (83.3 kg)   SpO2 97%   BMI 29.63 kg/m    Wt Readings from Last 3 Encounters:  04/17/23 183 lb 9.6 oz (83.3 kg)  04/12/23 181 lb 3.2 oz (82.2 kg)  01/11/23 188 lb 4.8 oz (85.4 kg)    Constitutional:      Appearance: Healthy appearance. Not in distress.  Neck:     Vascular: No carotid bruit. JVD normal.  Pulmonary:     Breath sounds: Normal breath sounds. No wheezing. No rales.  Cardiovascular:     Normal rate. Normal S2.      Murmurs: There is a grade 2/6 systolic murmur at the URSB.  Edema:     Peripheral edema absent.  Abdominal:     Palpations: Abdomen is soft.       ASSESSMENT AND PLAN:   Hypertension Blood pressure is controlled on current therapy.  Continue losartan 100 mg daily, diltiazem 180 mg daily.  Refill sent in today for 1 year.  Follow-up 1 year.  Aortic atherosclerosis (HCC) Noted on prior CT scan.  Continue aspirin 81 mg 3 days a week.  She notes that she will not take a statin.  Her labs from primary care were reviewed via [facility].  Her LDL was 102 in March 2024.  I would aim for a goal of less than 70 for LDL.  She will continue to try to work on diet.  I recommended a plant-based, whole food, high-fiber diet.  She hopes to get back to exercising soon.  Arrange fasting lipids in 6 months.  If LDL remains above 70, consider adding ezetimibe.  Aortic valve sclerosis Systolic murmur heard on exam.  However, echocardiogram in November 2023 demonstrated AV sclerosis.  No further workup needed at this time.     Dispo:  Return in  about 1 year (around 04/16/2024) for Routine Follow Up, w/ Dr. Excell [name], or Nicole Newcomer, PA-C.  Signed, Nicole Newcomer, PA-C

## 2023-04-17 NOTE — Assessment & Plan Note (Signed)
Blood pressure is controlled on current therapy.  Continue losartan 100 mg daily, diltiazem 180 mg daily.  Refill sent in today for 1 year.  Follow-up 1 year.

## 2023-04-18 ENCOUNTER — Other Ambulatory Visit: Payer: Self-pay

## 2023-04-18 ENCOUNTER — Other Ambulatory Visit (HOSPITAL_COMMUNITY): Payer: Self-pay

## 2023-04-18 ENCOUNTER — Other Ambulatory Visit (HOSPITAL_BASED_OUTPATIENT_CLINIC_OR_DEPARTMENT_OTHER): Payer: Self-pay

## 2023-04-20 ENCOUNTER — Other Ambulatory Visit: Payer: Self-pay | Admitting: Physician Assistant

## 2023-04-20 ENCOUNTER — Other Ambulatory Visit: Payer: Self-pay

## 2023-04-20 ENCOUNTER — Telehealth: Payer: Self-pay | Admitting: Emergency Medicine

## 2023-04-20 NOTE — Telephone Encounter (Signed)
Pt wants to talk about inhalers that ere sent, pt states they are not working

## 2023-04-21 ENCOUNTER — Other Ambulatory Visit: Payer: Self-pay

## 2023-04-21 MED ORDER — BREZTRI AEROSPHERE 160-9-4.8 MCG/ACT IN AERO
2.0000 | INHALATION_SPRAY | Freq: Two times a day (BID) | RESPIRATORY_TRACT | 11 refills | Status: DC
  Filled 2023-04-21: qty 10.7, 30d supply, fill #0

## 2023-04-21 NOTE — Telephone Encounter (Signed)
Discontinued advair and sent in prescription for breztri 2 puffs twice daily - needs to rinse mouth/brush tongue and teeth after each use

## 2023-04-21 NOTE — Telephone Encounter (Signed)
Spoke with patient. She states the Advair inhaler isnt working well for her. She was given a sample of Breztri previously. She states she prefers this inhaler-works much better.   Dr. Thora Lance is it okay to send prescription in for [facility]?  Pharmacy [facility]

## 2023-04-24 ENCOUNTER — Other Ambulatory Visit: Payer: Self-pay

## 2023-04-24 DIAGNOSIS — Z1231 Encounter for screening mammogram for malignant neoplasm of breast: Secondary | ICD-10-CM | POA: Diagnosis not present

## 2023-04-24 DIAGNOSIS — M8588 Other specified disorders of bone density and structure, other site: Secondary | ICD-10-CM | POA: Diagnosis not present

## 2023-04-24 MED ORDER — MESALAMINE ER 0.375 G PO CP24
1.5000 g | ORAL_CAPSULE | Freq: Every morning | ORAL | 0 refills | Status: DC
  Filled 2023-04-24: qty 360, 90d supply, fill #0

## 2023-04-25 ENCOUNTER — Other Ambulatory Visit: Payer: Self-pay

## 2023-04-26 ENCOUNTER — Other Ambulatory Visit: Payer: Self-pay | Admitting: Obstetrics and Gynecology

## 2023-04-26 DIAGNOSIS — R928 Other abnormal and inconclusive findings on diagnostic imaging of breast: Secondary | ICD-10-CM

## 2023-04-27 IMAGING — DX DG ABDOMEN 2V
2 series · 2 of 2 positions shown · non-contrast
Comparison: 12/10/2020.

CLINICAL DATA: Crohn's disease.  Abdominal pain and vomiting.

EXAM:
ABDOMEN - 2 VIEW

[dg abd 2 views (1 of 2)]
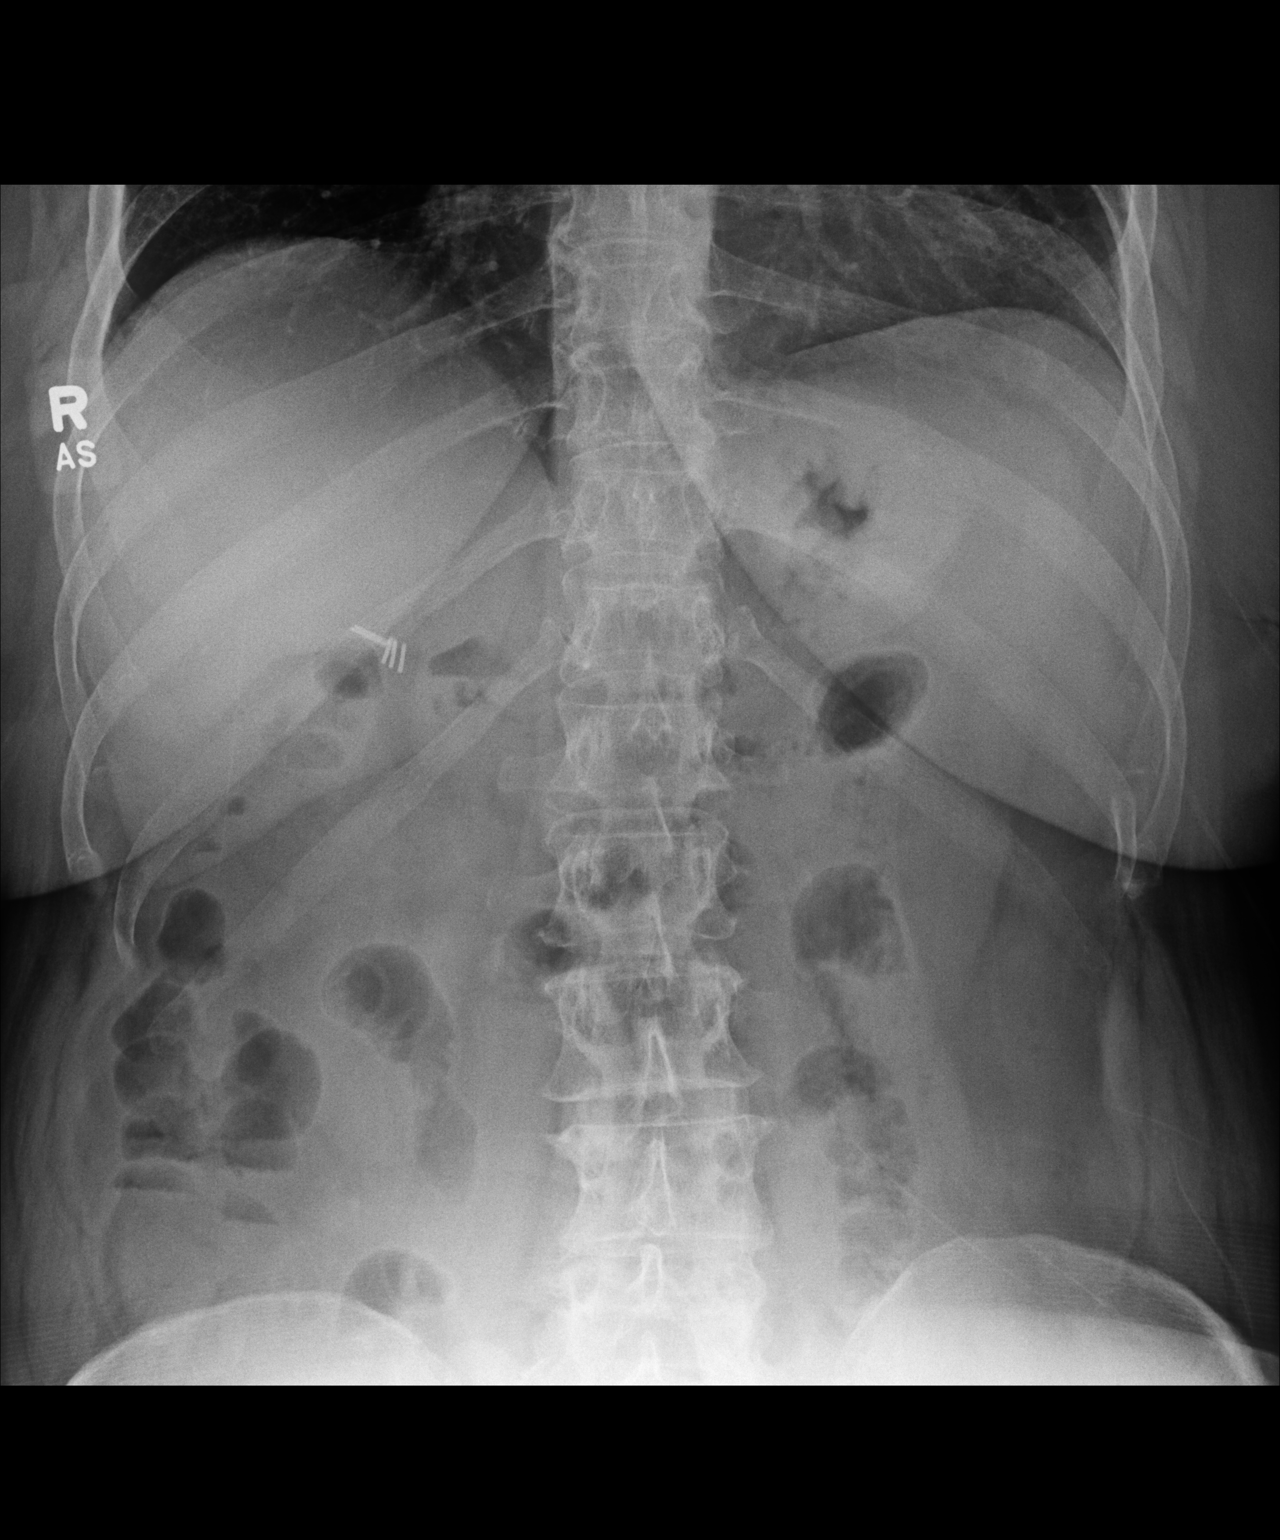

[dg abd 2 views (2 of 2)]
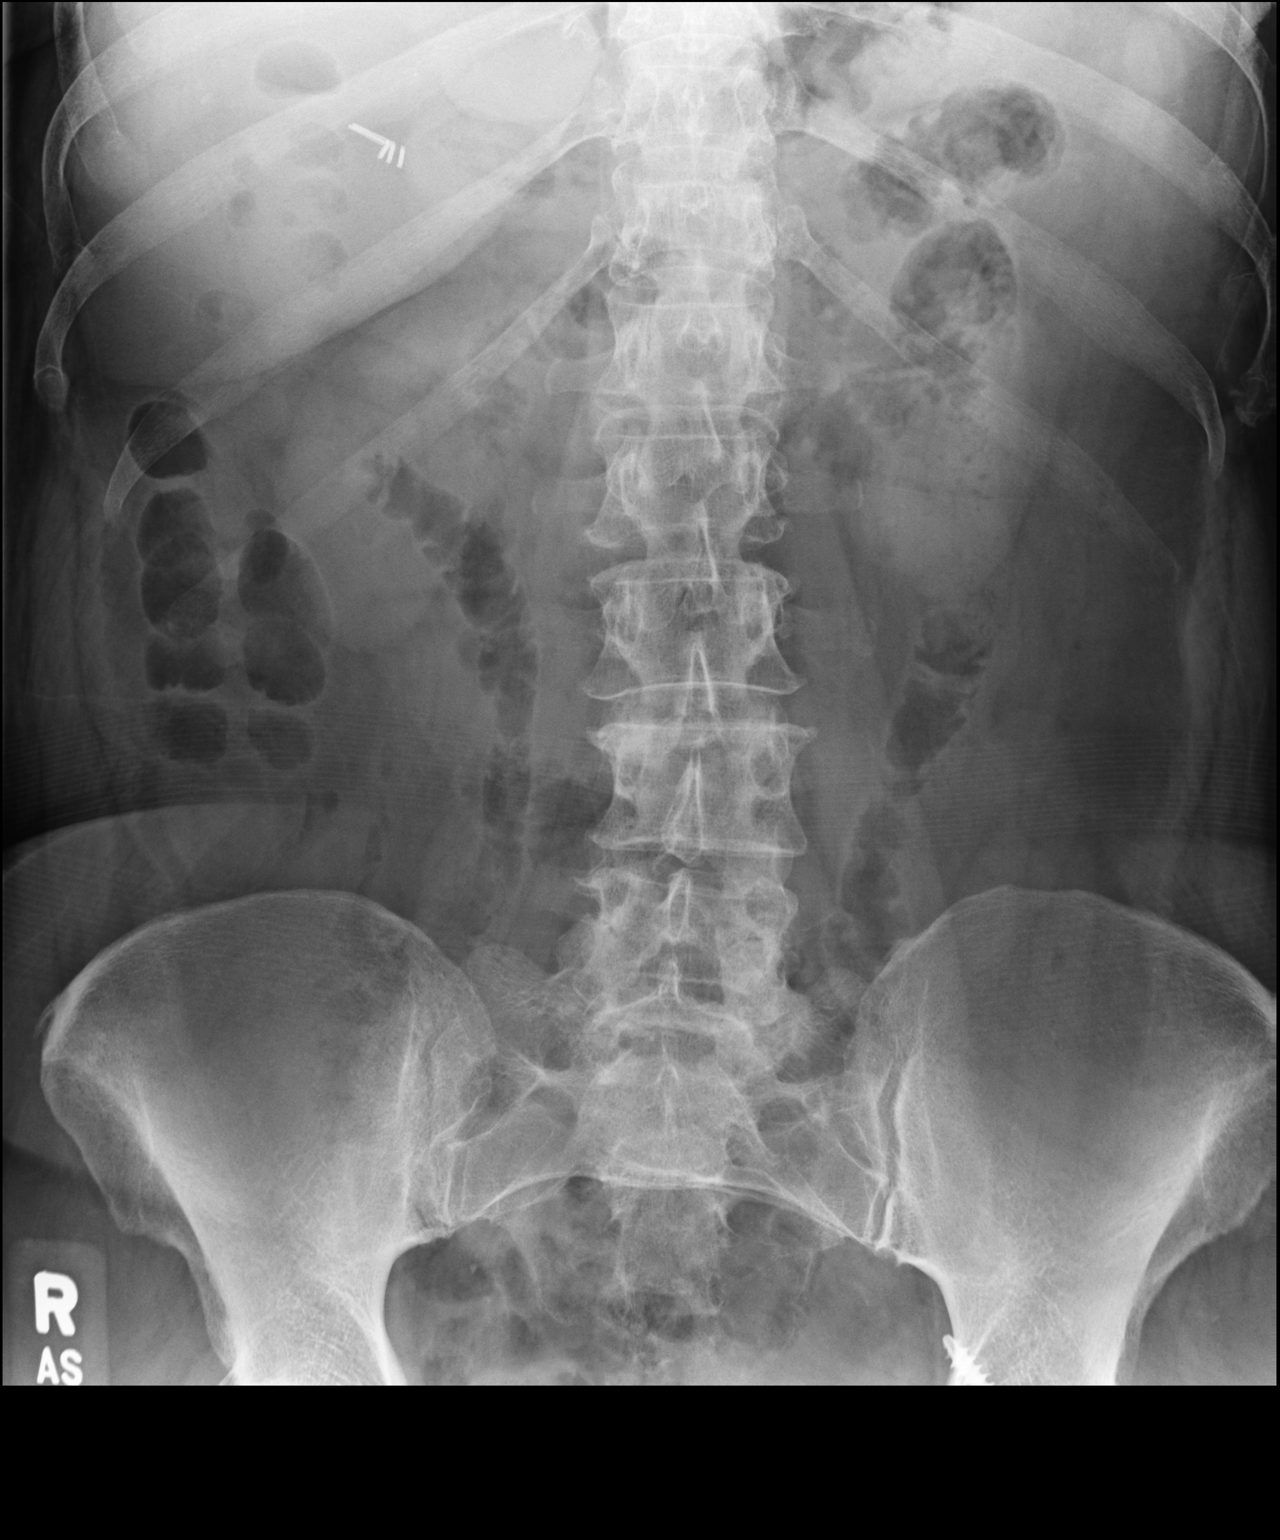

[2 of 2 positions shown; findings below may reference images not displayed]

FINDINGS: Surgical clips right upper quadrant. Several air-filled loops of
nondistended small bowel noted. Colon is nondistended. No free air.
Degenerative changes scoliosis lumbar spine. Degenerative changes
right hip. Total left hip replacement.
IMPRESSION: Several air-filled loops of nondistended small bowel noted. This is
nonspecific. Colon is nondistended. No acute abnormality identified.

## 2023-05-08 ENCOUNTER — Other Ambulatory Visit: Payer: Self-pay | Admitting: Obstetrics and Gynecology

## 2023-05-08 ENCOUNTER — Ambulatory Visit
Admission: RE | Admit: 2023-05-08 | Discharge: 2023-05-08 | Disposition: A | Discharge status: Home / Self Care | Payer: Medicare Other | Source: Ambulatory Visit | Attending: Obstetrics and Gynecology | Admitting: Obstetrics and Gynecology

## 2023-05-08 ENCOUNTER — Other Ambulatory Visit: Payer: Self-pay

## 2023-05-08 DIAGNOSIS — R590 Localized enlarged lymph nodes: Secondary | ICD-10-CM | POA: Diagnosis not present

## 2023-05-08 DIAGNOSIS — N6320 Unspecified lump in the left breast, unspecified quadrant: Secondary | ICD-10-CM

## 2023-05-08 DIAGNOSIS — R928 Other abnormal and inconclusive findings on diagnostic imaging of breast: Secondary | ICD-10-CM

## 2023-05-08 DIAGNOSIS — S80862A Insect bite (nonvenomous), left lower leg, initial encounter: Secondary | ICD-10-CM | POA: Diagnosis not present

## 2023-05-08 DIAGNOSIS — L0390 Cellulitis, unspecified: Secondary | ICD-10-CM | POA: Diagnosis not present

## 2023-05-08 DIAGNOSIS — I10 Essential (primary) hypertension: Secondary | ICD-10-CM | POA: Diagnosis not present

## 2023-05-08 DIAGNOSIS — K5000 Crohn's disease of small intestine without complications: Secondary | ICD-10-CM | POA: Diagnosis not present

## 2023-05-08 DIAGNOSIS — N6325 Unspecified lump in the left breast, overlapping quadrants: Secondary | ICD-10-CM | POA: Diagnosis not present

## 2023-05-08 MED ORDER — CEPHALEXIN 500 MG PO CAPS
1000.0000 mg | ORAL_CAPSULE | Freq: Two times a day (BID) | ORAL | 0 refills | Status: DC
  Filled 2023-05-08: qty 28, 7d supply, fill #0

## 2023-05-24 ENCOUNTER — Other Ambulatory Visit: Payer: Self-pay

## 2023-05-30 ENCOUNTER — Ambulatory Visit
Admission: RE | Admit: 2023-05-30 | Discharge: 2023-05-30 | Disposition: A | Discharge status: Home / Self Care | Payer: Medicare Other | Source: Ambulatory Visit | Attending: Obstetrics and Gynecology | Admitting: Obstetrics and Gynecology

## 2023-05-30 DIAGNOSIS — R928 Other abnormal and inconclusive findings on diagnostic imaging of breast: Secondary | ICD-10-CM

## 2023-05-30 DIAGNOSIS — N6325 Unspecified lump in the left breast, overlapping quadrants: Secondary | ICD-10-CM | POA: Diagnosis not present

## 2023-05-30 DIAGNOSIS — N6320 Unspecified lump in the left breast, unspecified quadrant: Secondary | ICD-10-CM

## 2023-05-30 DIAGNOSIS — N6012 Diffuse cystic mastopathy of left breast: Secondary | ICD-10-CM | POA: Diagnosis not present

## 2023-05-30 DIAGNOSIS — R590 Localized enlarged lymph nodes: Secondary | ICD-10-CM | POA: Diagnosis not present

## 2023-05-30 HISTORY — PX: BREAST BIOPSY: SHX20

## 2023-06-01 DIAGNOSIS — L570 Actinic keratosis: Secondary | ICD-10-CM | POA: Diagnosis not present

## 2023-06-01 DIAGNOSIS — B353 Tinea pedis: Secondary | ICD-10-CM | POA: Diagnosis not present

## 2023-06-01 DIAGNOSIS — D692 Other nonthrombocytopenic purpura: Secondary | ICD-10-CM | POA: Diagnosis not present

## 2023-06-01 DIAGNOSIS — Z85828 Personal history of other malignant neoplasm of skin: Secondary | ICD-10-CM | POA: Diagnosis not present

## 2023-06-01 DIAGNOSIS — D225 Melanocytic nevi of trunk: Secondary | ICD-10-CM | POA: Diagnosis not present

## 2023-06-01 DIAGNOSIS — L565 Disseminated superficial actinic porokeratosis (DSAP): Secondary | ICD-10-CM | POA: Diagnosis not present

## 2023-06-01 DIAGNOSIS — L80 Vitiligo: Secondary | ICD-10-CM | POA: Diagnosis not present

## 2023-06-01 DIAGNOSIS — L821 Other seborrheic keratosis: Secondary | ICD-10-CM | POA: Diagnosis not present

## 2023-06-02 ENCOUNTER — Ambulatory Visit: Payer: Medicare Other | Admitting: Emergency Medicine

## 2023-06-02 ENCOUNTER — Encounter: Payer: Self-pay | Admitting: Emergency Medicine

## 2023-06-02 VITALS — BP 122/68 | HR 59 | Temp 98.1°F | Ht 66.0 in | Wt 178.8 lb

## 2023-06-02 DIAGNOSIS — J387 Other diseases of larynx: Secondary | ICD-10-CM

## 2023-06-02 DIAGNOSIS — J4489 Other specified chronic obstructive pulmonary disease: Secondary | ICD-10-CM | POA: Diagnosis not present

## 2023-06-02 NOTE — Assessment & Plan Note (Signed)
She has long stretches with minimal sx, then gets quite debilitated w sx when she flares. Believe she would benefit from at lease an ICS, probably needs LABA/ICS. She wants to defer for now.   We will hold off on starting a maintenance inhaler right now.  Keep albuterol available to use 2 puffs up to every 4 hours if needed for shortness of breath, chest tightness, wheezing.  We may consider repeating your PFT in the future Follow with Dr Delton Coombes in 6 months or sooner if you have any problems

## 2023-06-02 NOTE — Assessment & Plan Note (Signed)
Continue to treat GERD and PND.   Start loratadine 10mg  (generic claritin) once daily. She only uses flonase prn Continue your nexium

## 2023-06-02 NOTE — Progress Notes (Signed)
Subjective:    Patient ID: Nicole [name], female    DOB: 05-27-1956, 67 y.o.   MRN: [id number]  Asthma She complains of cough and wheezing. There is no shortness of breath. Pertinent negatives include no ear pain, fever, headaches, postnasal drip, rhinorrhea, sneezing, sore throat or trouble swallowing. Her past medical history is significant for asthma.   ROV 12/14/20 --67 year old woman former smoker (40 pack years) whom I have seen in the past for COPD with a borderline bronchodilator response, allergic rhinitis, GERD, upper airway instability with associated cough.  Last seen by me in 2017, last seen in our office 11/20/2018.  She called 11/23/2020 with 1 month of increased cough, whitish mucus and associated shortness of breath.  No wheezing, no viral prodrome.  I treated her with a prednisone taper.  She has been on Breo but only uses it in spots - about 1-2x a week. She has albuterol which she uses about once a day - does help her breathing some. She doesn't believe that the prednisone changed her symptoms at all. She has to clear mucous most mornings, feels some chest tightness in the afternoons that will respond to albuterol.  Nexium 40mg  bid, but still has some breakthrough GERD sx depending on what she eats.  She has nasal gtt, uses nasal saline daily, uses flonase  ROV 06/02/23 -- 67F former smoker w COPD / asthma, chronic cough and UA instability. I las saw her 2022. Shehas GERD, rhinitis. She has been experiencing recurrent flaring sx, prednisone tapers. Last was [id number], after a URI. Currently managed on Nexium bid, couldn't tolerate zyrtec. Flonase prn, does not use Astelin. Using albuterol about 3x a week, rare neb use.  Reports today that her most recent flare was May. Currently doing well. When she flares she has a lot of PND that leads to cough. Can happen w cold air. She has Clinical cytogeneticist, prefers it to Advair, but she does not take it every day.    PFT 05/2021 reviewed, show moderately  severe obstruction with a posirtive BD response, normal volumes.    Review of Systems  Constitutional:  Negative for fever and unexpected weight change.  HENT:  Negative for congestion, dental problem, ear pain, nosebleeds, postnasal drip, rhinorrhea, sinus pressure, sneezing, sore throat and trouble swallowing.   Eyes:  Negative for redness and itching.  Respiratory:  Positive for cough and wheezing. Negative for chest tightness and shortness of breath.   Cardiovascular:  Negative for palpitations and leg swelling.  Gastrointestinal:  Negative for nausea and vomiting.  Genitourinary:  Negative for dysuria.  Musculoskeletal:  Negative for joint swelling.  Skin:  Negative for rash.  Neurological:  Negative for headaches.  Hematological:  Does not bruise/bleed easily.  Psychiatric/Behavioral:  Negative for dysphoric mood. The patient is not nervous/anxious.        Objective:   Physical Exam Vitals:   06/02/23 0836  BP: 122/68  Pulse: (!) 59  Temp: 98.1 F (36.7 C)  TempSrc: Oral  SpO2: 97%  Weight: 178 lb 12.8 oz (81.1 kg)  Height: 5\' 6"  (1.676 m)   Gen: Pleasant, well-nourished, in no distress,  normal affect  ENT: No lesions,  mouth clear,  oropharynx clear, no postnasal drip  Neck: No JVD, no stridor  Lungs: No use of accessory muscles, clear without rales or rhonchi  Cardiovascular: RRR, heart sounds normal, no murmur or gallops, no peripheral edema  Musculoskeletal: No deformities, no cyanosis or clubbing  Neuro: alert, non focal  Skin:  Warm, no lesions or rashes      Assessment & Plan:  COPD with asthma She has long stretches with minimal sx, then gets quite debilitated w sx when she flares. Believe she would benefit from at lease an ICS, probably needs LABA/ICS. She wants to defer for now.   We will hold off on starting a maintenance inhaler right now.  Keep albuterol available to use 2 puffs up to every 4 hours if needed for shortness of breath, chest  tightness, wheezing.  We may consider repeating your PFT in the future Follow with Dr Delton Coombes in 6 months or sooner if you have any problems  Irritable larynx Continue to treat GERD and PND.   Start loratadine 10mg  (generic claritin) once daily. She only uses flonase prn Continue your nexium  Levy Pupa, MD, PhD 06/02/2023, 8:58 AM [facility] [phone] or if no answer [phone]

## 2023-06-02 NOTE — Patient Instructions (Addendum)
We will hold off on starting a maintenance inhaler right now.  Keep albuterol available to use 2 puffs up to every 4 hours if needed for shortness of breath, chest tightness, wheezing.  Start loratadine 10mg  (generic claritin) once dauli Continue your nexium We may consider repeating your PFT in the future Follow with Dr Delton Coombes in 6 months or sooner if you have any problems

## 2023-06-09 DIAGNOSIS — S83242A Other tear of medial meniscus, current injury, left knee, initial encounter: Secondary | ICD-10-CM | POA: Diagnosis not present

## 2023-06-09 DIAGNOSIS — M25561 Pain in right knee: Secondary | ICD-10-CM | POA: Diagnosis not present

## 2023-06-15 DIAGNOSIS — M25561 Pain in right knee: Secondary | ICD-10-CM | POA: Diagnosis not present

## 2023-06-19 DIAGNOSIS — M25561 Pain in right knee: Secondary | ICD-10-CM | POA: Diagnosis not present

## 2023-06-20 ENCOUNTER — Other Ambulatory Visit (HOSPITAL_COMMUNITY): Payer: Self-pay

## 2023-06-20 ENCOUNTER — Other Ambulatory Visit: Payer: Self-pay

## 2023-06-21 ENCOUNTER — Other Ambulatory Visit (HOSPITAL_COMMUNITY): Payer: Self-pay

## 2023-06-22 DIAGNOSIS — M25561 Pain in right knee: Secondary | ICD-10-CM | POA: Diagnosis not present

## 2023-06-27 DIAGNOSIS — M25561 Pain in right knee: Secondary | ICD-10-CM | POA: Diagnosis not present

## 2023-06-29 ENCOUNTER — Other Ambulatory Visit: Payer: Self-pay

## 2023-06-29 DIAGNOSIS — M25561 Pain in right knee: Secondary | ICD-10-CM | POA: Diagnosis not present

## 2023-07-05 ENCOUNTER — Other Ambulatory Visit: Payer: Self-pay

## 2023-07-05 MED ORDER — VALACYCLOVIR HCL 1 G PO TABS
2000.0000 mg | ORAL_TABLET | Freq: Two times a day (BID) | ORAL | 0 refills | Status: AC
  Filled 2023-07-05: qty 90, 23d supply, fill #0

## 2023-07-05 MED ORDER — CLOBETASOL PROPIONATE 0.05 % EX OINT
1.0000 | TOPICAL_OINTMENT | Freq: Two times a day (BID) | CUTANEOUS | 0 refills | Status: AC
  Filled 2023-07-05: qty 15, 30d supply, fill #0

## 2023-07-06 DIAGNOSIS — G250 Essential tremor: Secondary | ICD-10-CM | POA: Diagnosis not present

## 2023-07-06 DIAGNOSIS — R252 Cramp and spasm: Secondary | ICD-10-CM | POA: Diagnosis not present

## 2023-07-06 DIAGNOSIS — M25561 Pain in right knee: Secondary | ICD-10-CM | POA: Diagnosis not present

## 2023-07-06 DIAGNOSIS — S83242A Other tear of medial meniscus, current injury, left knee, initial encounter: Secondary | ICD-10-CM | POA: Diagnosis not present

## 2023-07-10 DIAGNOSIS — M25561 Pain in right knee: Secondary | ICD-10-CM | POA: Diagnosis not present

## 2023-07-18 DIAGNOSIS — M25561 Pain in right knee: Secondary | ICD-10-CM | POA: Diagnosis not present

## 2023-07-20 DIAGNOSIS — M25561 Pain in right knee: Secondary | ICD-10-CM | POA: Diagnosis not present

## 2023-07-25 DIAGNOSIS — M25561 Pain in right knee: Secondary | ICD-10-CM | POA: Diagnosis not present

## 2023-07-27 DIAGNOSIS — M25561 Pain in right knee: Secondary | ICD-10-CM | POA: Diagnosis not present

## 2023-07-27 DIAGNOSIS — W540XXA Bitten by dog, initial encounter: Secondary | ICD-10-CM | POA: Diagnosis not present

## 2023-07-27 DIAGNOSIS — S41131A Puncture wound without foreign body of right upper arm, initial encounter: Secondary | ICD-10-CM | POA: Diagnosis not present

## 2023-07-27 DIAGNOSIS — M79601 Pain in right arm: Secondary | ICD-10-CM | POA: Diagnosis not present

## 2023-08-01 ENCOUNTER — Other Ambulatory Visit: Payer: Self-pay

## 2023-08-01 DIAGNOSIS — S51831D Puncture wound without foreign body of right forearm, subsequent encounter: Secondary | ICD-10-CM | POA: Diagnosis not present

## 2023-08-01 MED ORDER — AMOXICILLIN-POT CLAVULANATE 875-125 MG PO TABS
1.0000 | ORAL_TABLET | Freq: Two times a day (BID) | ORAL | 0 refills | Status: DC
  Filled 2023-08-01: qty 14, 7d supply, fill #0

## 2023-08-02 ENCOUNTER — Other Ambulatory Visit: Payer: Self-pay

## 2023-08-02 DIAGNOSIS — T63481A Toxic effect of venom of other arthropod, accidental (unintentional), initial encounter: Secondary | ICD-10-CM | POA: Diagnosis not present

## 2023-08-02 DIAGNOSIS — K5000 Crohn's disease of small intestine without complications: Secondary | ICD-10-CM | POA: Diagnosis not present

## 2023-08-02 MED ORDER — MESALAMINE ER 0.375 G PO CP24
1.5000 g | ORAL_CAPSULE | Freq: Every morning | ORAL | 3 refills | Status: AC
  Filled 2023-08-02 – 2023-10-04 (×3): qty 360, 90d supply, fill #0

## 2023-08-03 ENCOUNTER — Other Ambulatory Visit: Payer: Self-pay

## 2023-08-04 ENCOUNTER — Other Ambulatory Visit: Payer: Self-pay

## 2023-08-07 DIAGNOSIS — M25561 Pain in right knee: Secondary | ICD-10-CM | POA: Diagnosis not present

## 2023-08-09 DIAGNOSIS — M25561 Pain in right knee: Secondary | ICD-10-CM | POA: Diagnosis not present

## 2023-08-14 DIAGNOSIS — M25561 Pain in right knee: Secondary | ICD-10-CM | POA: Diagnosis not present

## 2023-08-16 DIAGNOSIS — M25561 Pain in right knee: Secondary | ICD-10-CM | POA: Diagnosis not present

## 2023-08-18 ENCOUNTER — Other Ambulatory Visit: Payer: Self-pay

## 2023-08-21 DIAGNOSIS — M25561 Pain in right knee: Secondary | ICD-10-CM | POA: Diagnosis not present

## 2023-08-23 DIAGNOSIS — M25561 Pain in right knee: Secondary | ICD-10-CM | POA: Diagnosis not present

## 2023-08-28 DIAGNOSIS — M25561 Pain in right knee: Secondary | ICD-10-CM | POA: Diagnosis not present

## 2023-08-30 DIAGNOSIS — M25561 Pain in right knee: Secondary | ICD-10-CM | POA: Diagnosis not present

## 2023-09-04 DIAGNOSIS — M25561 Pain in right knee: Secondary | ICD-10-CM | POA: Diagnosis not present

## 2023-09-07 DIAGNOSIS — M25561 Pain in right knee: Secondary | ICD-10-CM | POA: Diagnosis not present

## 2023-09-11 DIAGNOSIS — Z23 Encounter for immunization: Secondary | ICD-10-CM | POA: Diagnosis not present

## 2023-09-12 DIAGNOSIS — M25561 Pain in right knee: Secondary | ICD-10-CM | POA: Diagnosis not present

## 2023-09-18 DIAGNOSIS — M25561 Pain in right knee: Secondary | ICD-10-CM | POA: Diagnosis not present

## 2023-09-22 DIAGNOSIS — M25561 Pain in right knee: Secondary | ICD-10-CM | POA: Diagnosis not present

## 2023-09-25 ENCOUNTER — Other Ambulatory Visit: Payer: Self-pay

## 2023-09-25 DIAGNOSIS — H00021 Hordeolum internum right upper eyelid: Secondary | ICD-10-CM | POA: Diagnosis not present

## 2023-09-25 MED ORDER — CEPHALEXIN 500 MG PO CAPS
500.0000 mg | ORAL_CAPSULE | Freq: Two times a day (BID) | ORAL | 0 refills | Status: DC
  Filled 2023-09-25: qty 20, 10d supply, fill #0

## 2023-09-26 DIAGNOSIS — I878 Other specified disorders of veins: Secondary | ICD-10-CM | POA: Diagnosis not present

## 2023-09-28 ENCOUNTER — Other Ambulatory Visit: Payer: Self-pay

## 2023-10-04 ENCOUNTER — Other Ambulatory Visit: Payer: Self-pay

## 2023-10-13 ENCOUNTER — Other Ambulatory Visit: Payer: Self-pay

## 2023-10-16 ENCOUNTER — Ambulatory Visit: Payer: Medicare Other

## 2023-10-16 DIAGNOSIS — I700 Atherosclerosis of aorta: Secondary | ICD-10-CM

## 2023-10-20 DIAGNOSIS — I700 Atherosclerosis of aorta: Secondary | ICD-10-CM | POA: Diagnosis not present

## 2023-10-21 LAB — LIPID PANEL
Chol/HDL Ratio: 2.6 {ratio} (ref 0.0–4.4)
Cholesterol, Total: 184 mg/dL (ref 100–199)
HDL: 70 mg/dL (ref >39)
LDL Chol Calc (NIH): 98 mg/dL (ref 0–99)
Triglycerides: 90 mg/dL (ref 0–149)
VLDL Cholesterol Cal: 16 mg/dL (ref 5–40)

## 2023-10-25 ENCOUNTER — Telehealth: Payer: Self-pay | Admitting: Cardiovascular Disease

## 2023-10-25 NOTE — Telephone Encounter (Signed)
Pt returning nurses phone call regarding results. Please advise.

## 2023-10-25 NOTE — Telephone Encounter (Signed)
Ok. [name], PA-C    10/25/2023 4:53 PM

## 2023-10-25 NOTE — Telephone Encounter (Signed)
Nicole Lecher, PA-C 10/23/2023 11:39 AM EST     Results sent to [name] via MyChart. See MyChart comments below. PLAN: -If willing start Zetia 10 mg once daily and check fasting Lipids, ALT in 3 mos   Nicole [name] Your LDL cholesterol is above goal. We should try to get it less than 70. We can put you on a medication called Ezetimibe (Zetia) 10 mg once daily. It is not a statin and is usually well tolerated. [name], PA-C    Called and spoke with patient in detail about her cholesterol and [name]'s recommendation for zetia. Patient declines, states she will do better with her diet, but is not willing to take another medication. Routing to [name] as [employer].

## 2023-10-26 DIAGNOSIS — H26493 Other secondary cataract, bilateral: Secondary | ICD-10-CM | POA: Diagnosis not present

## 2023-12-18 ENCOUNTER — Other Ambulatory Visit: Payer: Self-pay

## 2023-12-28 ENCOUNTER — Telehealth: Payer: Self-pay | Admitting: Emergency Medicine

## 2023-12-28 MED ORDER — FLUCONAZOLE 100 MG PO TABS: 100.0000 mg | ORAL_TABLET | Freq: Every day | ORAL | 0 refills | Status: AC

## 2023-12-28 NOTE — Telephone Encounter (Signed)
Please have her try fluconazole 100 mg daily for 3 days.  Thank you

## 2023-12-28 NOTE — Telephone Encounter (Signed)
Spoke with the pt  She is c/o having thrush  She has a white coating on her tongue  Mouth is sore and has blisters on the back of her tongue  She was seen at [facility] and given nystatin rinse to take QID  She is not improving with this (has only had 3 days worth)  Asking for something else to help  She is scheduled for rov in April 2025  She states she rinses, brushes teeth and tongue p breztri  Please advise, thanks!  Allergies  Allergen Reactions   Hctz [Hydrochlorothiazide] Other (See Comments)    MUSCLE CRAMPING   Hydralazine Hcl Other (See Comments)    Cramping   Metoclopramide Hcl Other (See Comments)    Cramping, anxiety and depression; "makes me crazy"   Other Other (See Comments)    Cramping   Tramadol Other (See Comments)    "wig out"   Voltaren [Diclofenac Sodium] Other (See Comments)    Swelling in leg and became parched.    Bacitracin    Bacitracin-Polymyxin B     Other reaction(s): blisters   Diclofenac Sodium     Other reaction(s): Swelling/Dry mouth   Gabapentin     Other reaction(s): Lightheadedness   Polymyxin B    Avelox [Moxifloxacin Hcl In Nacl] Nausea And Vomiting   Clindamycin Rash   Clindamycin/Lincomycin Rash   Flagyl [Metronidazole] Rash   Moxifloxacin Rash   Neosporin [Neomycin-Bacitracin Zn-Polymyx] Other (See Comments)    Blisters, rash   Pristiq [Desvenlafaxine Succinate Er] Other (See Comments)    Intolerance.

## 2023-12-28 NOTE — Telephone Encounter (Signed)
Rx sent to preferred pharmacy.  Pt is aware and voiced her understanding.  Nothing further needed

## 2023-12-28 NOTE — Telephone Encounter (Signed)
Patient currently has thrush. Went to Urgent Care and was prescribed Nystatin. States still has thrush. Pharmacy is [facility]. Phone number is [phone]. Patient phone number is [phone].

## 2024-01-01 ENCOUNTER — Other Ambulatory Visit: Payer: Self-pay

## 2024-01-02 ENCOUNTER — Other Ambulatory Visit: Payer: Self-pay

## 2024-01-02 MED ORDER — ITRACONAZOLE 100 MG PO CAPS
100.0000 mg | ORAL_CAPSULE | Freq: Two times a day (BID) | ORAL | 0 refills | Status: DC
  Filled 2024-01-02 – 2024-01-04 (×5): qty 28, 14d supply, fill #0

## 2024-01-03 ENCOUNTER — Telehealth: Payer: Self-pay | Admitting: Emergency Medicine

## 2024-01-03 ENCOUNTER — Other Ambulatory Visit: Payer: Self-pay

## 2024-01-03 NOTE — Telephone Encounter (Signed)
Breztri inhaler is too expensive(over $400) and patient cannot afford it. She would like samples and an alternative medication. Please call and advise [phone]  Pharmacy: [facility]

## 2024-01-04 ENCOUNTER — Other Ambulatory Visit: Payer: Self-pay

## 2024-01-04 MED ORDER — CLOBETASOL PROPIONATE 0.05 % EX OINT
TOPICAL_OINTMENT | Freq: Two times a day (BID) | CUTANEOUS | 0 refills | Status: DC
  Filled 2024-01-04: qty 45, 30d supply, fill #0

## 2024-01-04 MED ORDER — ALPRAZOLAM 0.5 MG PO TABS
0.5000 mg | ORAL_TABLET | Freq: Three times a day (TID) | ORAL | 0 refills | Status: DC | PRN
  Filled 2024-01-04: qty 270, 90d supply, fill #0

## 2024-01-05 ENCOUNTER — Other Ambulatory Visit: Payer: Self-pay

## 2024-01-05 ENCOUNTER — Other Ambulatory Visit: Payer: Self-pay | Admitting: Student

## 2024-01-05 NOTE — Telephone Encounter (Signed)
Patient can't afford $400 for Breztri. She has to meet deductible before ins covers. She states she's not paying that for inhaler. How would you like to proceeds?

## 2024-01-08 ENCOUNTER — Other Ambulatory Visit: Payer: Self-pay

## 2024-01-09 ENCOUNTER — Other Ambulatory Visit: Payer: Self-pay

## 2024-01-09 NOTE — Telephone Encounter (Signed)
 We do not have enough breast tree or alternative to keep her adequately supplied with samples  Okay to do a test run for any comparable ICS/LABA or ICS/LABA/LAMA to see their initial cost,  but all of these will likely be expensive until her medication deductible is met

## 2024-01-10 ENCOUNTER — Other Ambulatory Visit (HOSPITAL_COMMUNITY): Payer: Self-pay

## 2024-01-10 ENCOUNTER — Telehealth: Payer: Self-pay

## 2024-01-10 NOTE — Telephone Encounter (Signed)
 It looks like Wixela would be the most affordable substitution until her medication deductible is met.  We can try Wixela 250/50

## 2024-01-10 NOTE — Telephone Encounter (Signed)
 Test claims show the following preferred options under the patients plan *as you know patient has a deductible to meet which is causing higher prices at this time:   Trelegy- $396.14  Breo- $396.14 Dulera- $344.66 Brand Symbicort- $235.92 Generic Advair Diskus/Wixela- $91.37

## 2024-01-10 NOTE — Telephone Encounter (Signed)
*  sent to office in original encounter   Trelegy- $396.14  Breo- $396.14 [facility]- $344.66 Brand Symbicort- $235.92 Generic Advair Diskus/Wixela- $91.37

## 2024-01-11 NOTE — Telephone Encounter (Signed)
 ATC X1. Lmtcb

## 2024-01-11 NOTE — Telephone Encounter (Signed)
 Patient is returning phone call. Patient phone number is [phone]

## 2024-01-12 ENCOUNTER — Other Ambulatory Visit: Payer: Self-pay

## 2024-01-12 MED ORDER — FLUTICASONE-SALMETEROL 250-50 MCG/ACT IN AEPB
1.0000 | INHALATION_SPRAY | Freq: Two times a day (BID) | RESPIRATORY_TRACT | 11 refills | Status: DC
  Filled 2024-01-12: qty 60, 30d supply, fill #0

## 2024-01-12 NOTE — Telephone Encounter (Signed)
 Patient checking on RX for inhaler. Pharmacy is [facility]. Patient phone number is [phone].

## 2024-01-12 NOTE — Telephone Encounter (Signed)
 Called and spoke with patient, advised that Monte Fantasia would be the least inexpensive for her at $91.37 and that she has a deductible to meet.  She asked that it be sent to [facility] pharmacy.  I advised her that I would send it and it would be waiting for her [place].  She said she did have some Advair with her to use until she returns.  She was upset that she did not receive a call prior to leaving the country and could not get through to the office.  I apologized and let her know that we have a plan in place to assist with making the wait times less and it easier to reach [place].  Nothing further needed.  Script sent to pharmacy.

## 2024-01-16 ENCOUNTER — Other Ambulatory Visit: Payer: Self-pay

## 2024-01-19 ENCOUNTER — Other Ambulatory Visit: Payer: Self-pay

## 2024-01-19 MED ORDER — NITROFURANTOIN MONOHYD MACRO 100 MG PO CAPS
100.0000 mg | ORAL_CAPSULE | Freq: Two times a day (BID) | ORAL | 0 refills | Status: DC
  Filled 2024-01-19: qty 14, 7d supply, fill #0

## 2024-01-25 ENCOUNTER — Telehealth: Payer: Self-pay | Admitting: Physician Assistant

## 2024-01-25 NOTE — Telephone Encounter (Signed)
 Pt called in stating she has been feeling very fatigue. She would like to come in only for an EKG. Please advise.

## 2024-01-25 NOTE — Telephone Encounter (Signed)
 Spoke to pt and she reports feeling fatigued x2-3 weeks. Admits to have occasional shortness of breath and feeling like her heart rate is fast. Pt admits that when she is feeling like she has a fast heart rate then she will bare down and she feels better. Pt reports that her blood pressure was taken once this week and it was 116/60. Pt has been out of the country and initially on return home she has significant amount of leg edema and was seen by her PCP. Once she started back to wearing her compression stockings the swelling improved. Pt has denied having chest pain. Pt has been scheduled for DOD clinic on 01/26/24. Pt agrees with plan of care.

## 2024-01-25 NOTE — Telephone Encounter (Signed)
 Agree. Pt needs appt. If she feels worse, go to the ED.  [name], PA-C    01/25/2024 5:53 PM

## 2024-01-25 NOTE — Telephone Encounter (Signed)
 Pt contacted and advised. Pt agrees with plan.

## 2024-01-26 ENCOUNTER — Ambulatory Visit: Attending: Cardiology | Admitting: Cardiology

## 2024-01-26 ENCOUNTER — Other Ambulatory Visit: Payer: Self-pay

## 2024-01-26 ENCOUNTER — Telehealth: Payer: Self-pay | Admitting: *Deleted

## 2024-01-26 ENCOUNTER — Encounter: Payer: Self-pay | Admitting: Cardiology

## 2024-01-26 VITALS — BP 118/70 | HR 67 | Resp 17 | Ht 66.0 in | Wt 174.0 lb

## 2024-01-26 DIAGNOSIS — R072 Precordial pain: Secondary | ICD-10-CM

## 2024-01-26 DIAGNOSIS — R0609 Other forms of dyspnea: Secondary | ICD-10-CM | POA: Diagnosis not present

## 2024-01-26 DIAGNOSIS — I700 Atherosclerosis of aorta: Secondary | ICD-10-CM | POA: Diagnosis not present

## 2024-01-26 DIAGNOSIS — R000 Tachycardia, unspecified: Secondary | ICD-10-CM

## 2024-01-26 DIAGNOSIS — I10 Essential (primary) hypertension: Secondary | ICD-10-CM | POA: Diagnosis not present

## 2024-01-26 DIAGNOSIS — R0683 Snoring: Secondary | ICD-10-CM

## 2024-01-26 MED ORDER — NITROGLYCERIN 0.4 MG SL SUBL
0.4000 mg | SUBLINGUAL_TABLET | SUBLINGUAL | 5 refills | Status: AC | PRN
Start: 2024-01-26 — End: ?
  Filled 2024-01-26: qty 25, 8d supply, fill #0

## 2024-01-26 MED ORDER — METOPROLOL TARTRATE 100 MG PO TABS
100.0000 mg | ORAL_TABLET | Freq: Once | ORAL | 0 refills | Status: DC
  Filled 2024-01-26: qty 1, 1d supply, fill #0

## 2024-01-26 NOTE — Telephone Encounter (Signed)
 Staff message sent to me pt needs to be set up Itamar same day as echo 02/16/24 @ 2;50.

## 2024-01-26 NOTE — Patient Instructions (Addendum)
 Medication Instructions:   NITROGLYCERIN 0.4 MG SUBLINGUAL (UNDER THE TONGUE)--PLACE ONE TABLET UNDER THE TONGUE EVERY 5 MINS AS NEEDED FOR CHEST PAIN--IF YOU ARE GETTING TO TABLET #3 WITH NO CHEST PAIN RELIEF, PLEASE CALL 911 FOR FURTHER EVALUATION   *If you need a refill on your cardiac medications before your next appointment, please call your pharmacy*    Testing/Procedures:  Your physician has requested that you have an echocardiogram. Echocardiography is a painless test that uses sound waves to create images of your heart. It provides your doctor with information about the size and shape of your heart and how well your heart's chambers and valves are working. This procedure takes approximately one hour. There are no restrictions for this procedure. Please do NOT wear cologne, perfume, aftershave, or lotions (deodorant is allowed). Please arrive 15 minutes prior to your appointment time.  Please note: We ask at that you not bring children with you during ultrasound (echo/ vascular) testing. Due to room size and safety concerns, children are not allowed in the ultrasound rooms during exams. Our front office staff cannot provide observation of children in our lobby area while testing is being conducted. An adult accompanying a patient to their appointment will only be allowed in the ultrasound room at the discretion of the ultrasound technician under special circumstances. We apologize for any inconvenience.   Your physician has recommended that you have a ITAMAR sleep study. This test records several body functions during sleep, including: brain activity, eye movement, oxygen and carbon dioxide blood levels, heart rate and rhythm, breathing rate and rhythm, the flow of air through your mouth and nose, snoring, body muscle movements, and chest and belly movement.  YOU WILL BE PROVIDE THIS DEVICE BY OUR SLEEP TEAM ON THE SAME DAY YOU COME FOR YOUR ECHO      Your cardiac CT will be scheduled  at one of the below locations:   [facility] [address] [place], [place] [address] [phone]   If scheduled at [facility], please arrive at the [facility] and Children's Entrance (Entrance C2) of [name] 30 minutes prior to test start time. You can use the FREE valet parking offered at entrance C (encouraged to control the heart rate for the test)  Proceed to the [facility] Radiology Department (first floor) to check-in and test prep.  All radiology patients and guests should use entrance C2 at [facility], accessed from [facility], even though the hospital's physical address listed is [address]     There is spacious parking and easy access to the radiology department from the [facility] V[facility] entrance. Please enter here and check-in with the desk attendant.   If scheduled at [facility], please arrive 30 minutes early for check-in and test prep.  Please follow these instructions carefully (unless otherwise directed):  An IV will be required for this test and Nitroglycerin will be given.  Hold all erectile dysfunction medications at least 3 days (72 hrs) prior to test. (Ie viagra, cialis, sildenafil, tadalafil, etc)   On the Night Before the Test: Be sure to Drink plenty of water. Do not consume any caffeinated/decaffeinated beverages or chocolate 12 hours prior to your test. Do not take any antihistamines 12 hours prior to your test.   On the Day of the Test: Drink plenty of water until 1 hour prior to the test. Do not eat any food 1 hour prior to test. You may  take your regular medications prior to the test.  Take metoprolol 100 MG BY MOUTH (Lopressor) two hours prior to test. If you take Furosemide/Hydrochlorothiazide/Spironolactone/Chlorthalidone, please HOLD on the morning of the test. Patients who wear a continuous glucose monitor MUST remove the device prior to scanning. FEMALES-  please wear underwire-free bra if available, avoid dresses & tight clothing       After the Test: Drink plenty of water. After receiving IV contrast, you may experience a mild flushed feeling. This is normal. On occasion, you may experience a mild rash up to 24 hours after the test. This is not dangerous. If this occurs, you can take Benadryl 25 mg, Zyrtec, Claritin, or Allegra and increase your fluid intake. (Patients taking Tikosyn should avoid Benadryl, and may take Zyrtec, Claritin, or Allegra) If you experience trouble breathing, this can be serious. If it is severe call 911 IMMEDIATELY. If it is mild, please call our office.  We will call to schedule your test 2-4 weeks out understanding that some insurance companies will need an authorization prior to the service being performed.   For more information and frequently asked questions, please visit our website : [email or web address]  For non-scheduling related questions, please contact the cardiac imaging nurse navigator should you have any questions/concerns: Cardiac Imaging Nurse Navigators Direct Office Dial: [phone]   For scheduling needs, including cancellations and rescheduling, please call [place], [phone].    Follow-Up:  3 MONTHS WITH EITHER [name] WEAVER PA-C OR DR. Excell Seltzer

## 2024-01-26 NOTE — Telephone Encounter (Signed)
 Left message to call back to see what time 02/16/24 pt wanted to come by the office to set up her Itamar study.

## 2024-01-26 NOTE — Telephone Encounter (Signed)
-----   Message from Nurse Corky Crafts sent at 01/26/2024  1:44 PM EDT ----- Regarding: Nicole [name] TO BE DONE SAME DAY AS ECHO ON 02/16/24 PER PATWARDHAN Patwardhan saw this pt in clinic today and wants her to get an ITAMAR done on the SAME day as she comes in for her ECHO appt on 02/16/24?   ECHO on 02/16/24 AT 2:50 pm--please provide ITAMAR at that time  I did his STOP BANG Score in his chart   Order is also in    Can you please arrange to provide her the WatchPat when she comes in for his echo on 02/16/24 at ?    Thanks  [employer]

## 2024-01-26 NOTE — Progress Notes (Signed)
 Cardiology Office Note:  .   Date:  01/26/2024  ID:  [name], DOB 1956/06/12, MRN [id number] PCP: Blair Heys, MD (Inactive)   HeartCare Providers Cardiologist:  Truett Mainland, MD PCP: Blair Heys, MD (Inactive)  Chief Complaint  Patient presents with   Tachycardia   Hypertension   Fatigue   New Patient (Initial Visit)      History of Present Illness: .    Nicole [name] is a 68 y.o. female with hypertension, aortic atherosclerosis, smoker  Patient is a Engineer, civil (consulting).  She had quit smoking, but resumed smoking recently after recent [place].  Since then, she has had symptoms of chest heaviness and exertional dyspnea.  Blood pressure is well-controlled.  Separately, she reports fatigue during the daytime.  She does snore, but never had sleep study before.    Vitals:   01/26/24 1152  BP: 118/70  Pulse: 67  Resp: 17  SpO2: 96%     ROS:  Review of Systems  Constitutional: Positive for malaise/fatigue.  Cardiovascular:  Positive for chest pain and dyspnea on exertion. Negative for leg swelling, palpitations and syncope.     Studies Reviewed: Marland Kitchen        EKG 01/26/2024: Normal sinus rhythm Normal ECG When compared with ECG of 18-Dec-2014 11:29, No significant change was found    Independently interpreted 10/2023: Chol 184, TG 90, HDL 70, LDL 98   Echocardiogram 09/2022: 1.. Left ventricular ejection fraction, by estimation, is 70 to 75%. Left  ventricular ejection fraction by PLAX is 74 %. The left ventricle has  hyperdynamic function. The left ventricle has no regional wall motion  abnormalities. Left ventricular  diastolic parameters were normal.   2. Right ventricular systolic function is normal. The right ventricular  size is normal. Tricuspid regurgitation signal is inadequate for assessing  PA pressure.   3. The mitral valve is abnormal. Trivial mitral valve regurgitation.   4. The aortic valve is tricuspid. Aortic valve  regurgitation is not  visualized. Aortic valve sclerosis/calcification is present, without any  evidence of aortic stenosis. Aortic valve mean gradient measures 9.0 mmHg.  DI is 0.63.   5. The inferior vena cava is normal in size with greater than 50%  respiratory variability, suggesting right atrial pressure of 3 mmHg.    Physical Exam:   Physical Exam Vitals and nursing note reviewed.  Constitutional:      General: She is not in acute distress. Neck:     Vascular: No JVD.  Cardiovascular:     Rate and Rhythm: Normal rate and regular rhythm.     Heart sounds: Normal heart sounds. No murmur heard. Pulmonary:     Effort: Pulmonary effort is normal.     Breath sounds: Normal breath sounds. No wheezing or rales.  Musculoskeletal:     Right lower leg: No edema.     Left lower leg: No edema.      VISIT DIAGNOSES:   ICD-10-CM   1. Exertional dyspnea  R06.09 ECHOCARDIOGRAM COMPLETE    Basic metabolic panel    2. Precordial pain  R07.2 EKG 12-Lead    EKG 12-Lead    CT CORONARY MORPH W/CTA COR W/SCORE W/CA W/CM &/OR WO/CM    metoprolol tartrate (LOPRESSOR) 100 MG tablet    ECHOCARDIOGRAM COMPLETE    Basic metabolic panel    nitroGLYCERIN (NITROSTAT) 0.4 MG SL tablet    3. Aortic atherosclerosis (HCC)  I70.0 Basic metabolic panel    4. Primary hypertension  I10 Basic metabolic  panel    5. Snoring  R06.83 Basic metabolic panel    Itamar Sleep Study       ASSESSMENT AND PLAN: .    Nicole [name] is a 68 y.o. female with hypertension, aortic atherosclerosis, smoker  Chest heaviness, exertional dyspnea: EKG and physical exam unremarkable.  Recommend echocardiogram and coronary CT angiogram for evaluation of LV function, and rule out any obstructive coronary artery disease.  Regardless, smoking cessation remains crucial.  Patient is very motivated to quit smoking.  Until further evaluation, recommend aspirin 81 mg daily, SL NTG PRN.  She is not keen on starting statin  therapy at this time.  Await coronary CTA above for further recommendations.    Hypertension: Well-controlled on losartan 1 mg daily, and diltiazem 180 mg daily.  Fatigue, snoring: Suspect obstructive sleep apnea.  Recommend home sleep study.     Meds ordered this encounter  Medications   metoprolol tartrate (LOPRESSOR) 100 MG tablet    Sig: Take 1 tablet (100 mg total) by mouth once for 1 dose. Take 90-120 minutes prior to scan. Hold for SBP less than 110.    Dispense:  1 tablet    Refill:  0   nitroGLYCERIN (NITROSTAT) 0.4 MG SL tablet    Sig: Place 1 tablet (0.4 mg total) under the tongue every 5 (five) minutes as needed for chest pain.    Dispense:  25 tablet    Refill:  5     F/u in 3 months  Signed, Elder Negus, MD

## 2024-01-29 ENCOUNTER — Telehealth: Payer: Self-pay

## 2024-01-29 ENCOUNTER — Telehealth: Payer: Self-pay | Admitting: *Deleted

## 2024-01-29 NOTE — Telephone Encounter (Signed)
 I called the pt back who states someone called her today about the sleep study. I reviewed the chart and informed the pt that it was the sleep coordinator and also the nurse calling to let her know she has been approved and to set up the study. I assured the pt that she and I s/w last week and that she is setting Itamar on 02/16/24 when she comes in for her echo. Pt will come by about 2:10 to set up study before her echo at 2:50. Pt thanked me for the call back and the help.

## 2024-01-29 NOTE — Telephone Encounter (Signed)
-----   Message from Nurse Rolanda Lundborg sent at 01/29/2024  7:31 AM EDT ----- Regarding: RE: Nicole [name] TO BE DONE SAME DAY AS ECHO ON 02/16/24 PER PATWARDHAN Hi All, PA is not required for this Nicole [name]-HST. Carol/Katie, Please give the pt the WatchPAT One-HST Pin # when she comes in for her ECHO on 02/16/24 AT 2:50 pm. Thanks, Larita Fife ----- Message ----- From: Loa Socks, LPN Sent: 2/84/1324   1:46 PM EDT To: Reesa Chew, CMA; Tarri Fuller, CMA; # Subject: [name] TO BE DONE SAME DAY AS ECHO ON 02/16/24#  Patwardhan saw this pt in clinic today and wants her to get an Nicole [name] done on the SAME day as she comes in for her ECHO appt on 02/16/24?   ECHO on 02/16/24 AT 2:50 pm--please provide Nicole [name] at that time  I did his STOP BANG Score in his chart   Order is also in    Can you please arrange to provide her the WatchPat when she comes in for his echo on 02/16/24 at ?    Thanks  [employer]

## 2024-01-29 NOTE — Telephone Encounter (Signed)
 Via, Lorelle Formosa, LPN  You; Loa Socks, [place] minutes ago (7:44 AM)    Okey Regal, Please give the pt the WatchPAT One-HST Pin # on 4/4 when she comes in for her Echo. Lajoyce Corners, Just FYI: Instructions for covering staff: 1. Please contact patient in 2 weeks if WatchPAT study results are not available yet. Remind patient to complete test. 2. If patient declines to proceed with test, please confirm that box is unopened and remind patient to return it to the office within 30 days. Route phone note to [facility] SLEEP STUDIES pool for tracking. 3. If box has been opened, please route phone note to [facility] SLEEP STUDIES pool to have device de-initialized and processed for billing. Thanks, Woody Seller, [place] minutes ago (7:44 AM)    Ordering provider: Dr Rosemary Holms Associated diagnoses: Snoring-R06.83 WatchPAT PA obtained on 01/29/2024 by Via, Lorelle Formosa, LPN. Authorization: Per the [facility] Provider Portal: 16109 (Itamar-HST) Description Sleep study, unattended, simultaneous recording; heart rate, oxygen saturation, respiratory analysis (eg, by airflow or peripheral arterial tone), and sleep time Inquiry summary Notification/Prior Authorization not required for this service. Patient not notified of PIN # (1234) on 01/29/2024 as the pt does not have a device yet. She will be given a WatchPAT One-HST Device and the PIN # "[id number]" on 02/16/24 when she comes to the office for an Echo.   Phone note routed to covering staff for follow-up.

## 2024-01-29 NOTE — Telephone Encounter (Signed)
**Note De-Identified Nicole [name] Obfuscation** Ordering provider: Dr Rosemary Holms Associated diagnoses: Snoring-R06.83 WatchPAT PA obtained on 01/29/2024 by Tramya Schoenfelder, Lorelle Formosa, LPN. Authorization: Per the [facility] Provider Portal: [id number] (Itamar-HST) Description Sleep study, unattended, simultaneous recording; heart rate, oxygen saturation, respiratory analysis (eg, by airflow or peripheral arterial tone), and sleep time Inquiry summary Notification/Prior Authorization not required for this service. Patient not notified of PIN # (1234) on 01/29/2024 as the pt does not have a device yet. She will be given a WatchPAT One-HST Device and the PIN # "[id number]" on 02/16/24 when she comes to the office for an Echo.  Phone note routed to covering staff for follow-up.

## 2024-01-29 NOTE — Telephone Encounter (Signed)
 Pt returning call, requesting cb

## 2024-01-30 ENCOUNTER — Other Ambulatory Visit: Payer: Self-pay

## 2024-01-30 MED ORDER — PREDNISOLONE ACETATE 1 % OP SUSP
1.0000 [drp] | Freq: Four times a day (QID) | OPHTHALMIC | 0 refills | Status: DC
  Filled 2024-01-30: qty 5, 25d supply, fill #0

## 2024-02-01 ENCOUNTER — Telehealth: Payer: Self-pay | Admitting: *Deleted

## 2024-02-01 NOTE — Telephone Encounter (Signed)
-----   Message from Lorrin Jackson sent at 02/01/2024 11:34 AM EDT ----- Regarding: ct scan Scheduled 3/31

## 2024-02-09 ENCOUNTER — Telehealth (HOSPITAL_COMMUNITY): Payer: Self-pay | Admitting: *Deleted

## 2024-02-09 NOTE — Telephone Encounter (Signed)
 Reaching out to patient to offer assistance regarding upcoming cardiac imaging study; pt verbalizes understanding of appt date/time, parking situation and where to check in, pre-test NPO status and medications ordered, and verified current allergies; name and call back number provided for further questions should they arise Johney Frame RN Navigator Cardiac Imaging [facility] [phone] office [phone] cell

## 2024-02-12 ENCOUNTER — Other Ambulatory Visit: Payer: Self-pay

## 2024-02-12 ENCOUNTER — Other Ambulatory Visit (HOSPITAL_COMMUNITY): Payer: Self-pay | Admitting: *Deleted

## 2024-02-12 ENCOUNTER — Ambulatory Visit (HOSPITAL_COMMUNITY)
Admission: RE | Admit: 2024-02-12 | Discharge: 2024-02-12 | Disposition: A | Discharge status: Home / Self Care | Source: Ambulatory Visit | Attending: Cardiology | Admitting: Cardiology

## 2024-02-12 DIAGNOSIS — R072 Precordial pain: Secondary | ICD-10-CM

## 2024-02-12 MED ORDER — IOHEXOL 350 MG/ML SOLN
95.0000 mL | Freq: Once | INTRAVENOUS | Status: AC | PRN
  Administered 2024-02-12: 95 mL via INTRAVENOUS

## 2024-02-12 MED ORDER — METOPROLOL TARTRATE 100 MG PO TABS
100.0000 mg | ORAL_TABLET | Freq: Once | ORAL | 0 refills | Status: AC
  Filled 2024-02-12 (×2): qty 1, 1d supply, fill #0

## 2024-02-12 MED ORDER — NITROGLYCERIN 0.4 MG SL SUBL
0.8000 mg | SUBLINGUAL_TABLET | Freq: Once | SUBLINGUAL | Status: AC
  Administered 2024-02-12: 0.8 mg via SUBLINGUAL

## 2024-02-12 MED ORDER — NITROGLYCERIN 0.4 MG SL SUBL
SUBLINGUAL_TABLET | SUBLINGUAL | Status: AC
  Filled 2024-02-12: qty 2

## 2024-02-13 ENCOUNTER — Other Ambulatory Visit: Payer: Self-pay

## 2024-02-13 DIAGNOSIS — H26491 Other secondary cataract, right eye: Secondary | ICD-10-CM | POA: Diagnosis not present

## 2024-02-13 MED ORDER — PREDNISOLONE ACETATE 1 % OP SUSP
1.0000 [drp] | Freq: Four times a day (QID) | OPHTHALMIC | 0 refills | Status: AC
Start: 2024-02-13 — End: 2024-02-18
  Filled 2024-02-13: qty 5, 25d supply, fill #0

## 2024-02-14 ENCOUNTER — Telehealth: Payer: Self-pay | Admitting: Cardiology

## 2024-02-14 ENCOUNTER — Other Ambulatory Visit: Payer: Self-pay

## 2024-02-14 NOTE — Telephone Encounter (Signed)
 Spoke with patient and she is aware provider have not reviewed CT. Once he finalize results. We will give her a call back

## 2024-02-14 NOTE — Telephone Encounter (Signed)
 Left message to call office

## 2024-02-14 NOTE — Telephone Encounter (Signed)
 Pt is returning call.

## 2024-02-14 NOTE — Telephone Encounter (Signed)
 Patient calling to see if she needs echo since her CT was good. Please advis e

## 2024-02-15 ENCOUNTER — Other Ambulatory Visit: Payer: Self-pay

## 2024-02-15 ENCOUNTER — Other Ambulatory Visit: Payer: Self-pay | Admitting: Emergency Medicine

## 2024-02-16 ENCOUNTER — Encounter: Payer: Self-pay | Admitting: Emergency Medicine

## 2024-02-16 ENCOUNTER — Ambulatory Visit (HOSPITAL_COMMUNITY): Attending: Cardiology

## 2024-02-16 ENCOUNTER — Ambulatory Visit: Payer: Medicare Other | Admitting: Emergency Medicine

## 2024-02-16 ENCOUNTER — Other Ambulatory Visit: Payer: Self-pay

## 2024-02-16 ENCOUNTER — Telehealth: Payer: Self-pay | Admitting: *Deleted

## 2024-02-16 VITALS — BP 121/74 | HR 63 | Ht 66.0 in | Wt 174.0 lb

## 2024-02-16 DIAGNOSIS — R0683 Snoring: Secondary | ICD-10-CM | POA: Diagnosis not present

## 2024-02-16 DIAGNOSIS — K219 Gastro-esophageal reflux disease without esophagitis: Secondary | ICD-10-CM | POA: Diagnosis not present

## 2024-02-16 DIAGNOSIS — J4489 Other specified chronic obstructive pulmonary disease: Secondary | ICD-10-CM

## 2024-02-16 DIAGNOSIS — J387 Other diseases of larynx: Secondary | ICD-10-CM | POA: Diagnosis not present

## 2024-02-16 DIAGNOSIS — J301 Allergic rhinitis due to pollen: Secondary | ICD-10-CM | POA: Diagnosis not present

## 2024-02-16 DIAGNOSIS — H2511 Age-related nuclear cataract, right eye: Secondary | ICD-10-CM | POA: Diagnosis not present

## 2024-02-16 DIAGNOSIS — R072 Precordial pain: Secondary | ICD-10-CM | POA: Diagnosis not present

## 2024-02-16 DIAGNOSIS — Z961 Presence of intraocular lens: Secondary | ICD-10-CM | POA: Diagnosis not present

## 2024-02-16 DIAGNOSIS — J309 Allergic rhinitis, unspecified: Secondary | ICD-10-CM | POA: Insufficient documentation

## 2024-02-16 DIAGNOSIS — R0609 Other forms of dyspnea: Secondary | ICD-10-CM | POA: Diagnosis not present

## 2024-02-16 LAB — ECHOCARDIOGRAM COMPLETE
Area-P 1/2: 3.27 cm2
Height: 66 in
S' Lateral: 3.2 cm
Weight: 2784 [oz_av]

## 2024-02-16 MED ORDER — BREZTRI AEROSPHERE 160-9-4.8 MCG/ACT IN AERO: INHALATION_SPRAY | RESPIRATORY_TRACT | Status: AC

## 2024-02-16 NOTE — Assessment & Plan Note (Signed)
 Using Astelin nasal spray.  I asked her to add back fluticasone nasal spray

## 2024-02-16 NOTE — Telephone Encounter (Signed)
 DR. Rosemary Holms ORDERED ITAMAR STUDY.   Patient agreement reviewed and signed on 02/16/2024.  WatchPAT issued to patient on 02/16/2024 by Danielle Rankin, CMA. Patient aware to not open the WatchPAT box until contacted with the activation PIN. Patient profile initialized in CloudPAT on 02/16/2024 by Danielle Rankin, CMA. Device serial number: [id number]  Please list Reason for Call as Advice Only and type "WatchPAT issued to patient" in the comment box.  PT IS APPROVED AND HAS BEEN GIVEN PIN # [id number]

## 2024-02-16 NOTE — Patient Instructions (Signed)
 We will try stopping Advair, restarting Breztri to see if you get more benefit.  Hopefully the Markus Daft will be affordable once you meet your medication deductible on your insurance.  If so we will continue it.  Remember to rinse and gargle after using. Keep albuterol available to use 2 puffs up to every 4 hours if needed for shortness of breath, chest tightness, wheezing.  Agree with sleep study as planned by cardiology.  Suspect you will qualify for treatment for sleep apnea You need to work hard on stopping smoking.  This is one of the most important things you can do for your breathing and overall health. We will review the official read on your CT scan of your chest when it becomes available Follow-up with Dr. Delton Coombes in 6 months, sooner if you have any problems.

## 2024-02-16 NOTE — Assessment & Plan Note (Signed)
 Due to GERD and rhinitis.  Working on treating both

## 2024-02-16 NOTE — Telephone Encounter (Signed)
-----   Message from Nurse Rolanda Lundborg sent at 01/29/2024  7:31 AM EDT ----- Regarding: RE: ITAMAR TO BE DONE SAME DAY AS ECHO ON 02/16/24 PER PATWARDHAN Hi All, PA is not required for this Itamar-HST. Carol/Katie, Please give the pt the WatchPAT One-HST Pin # when she comes in for her ECHO on 02/16/24 AT 2:50 pm. Thanks, Larita Fife ----- Message ----- From: Loa Socks, LPN Sent: 2/84/1324   1:46 PM EDT To: Reesa Chew, CMA; Tarri Fuller, CMA; # Subject: [name] TO BE DONE SAME DAY AS ECHO ON 02/16/24#  Patwardhan saw this pt in clinic today and wants her to get an ITAMAR done on the SAME day as she comes in for her ECHO appt on 02/16/24?   ECHO on 02/16/24 AT 2:50 pm--please provide ITAMAR at that time  I did his STOP BANG Score in his chart   Order is also in    Can you please arrange to provide her the WatchPat when she comes in for his echo on 02/16/24 at ?    Thanks  [employer]

## 2024-02-16 NOTE — Assessment & Plan Note (Signed)
 With some fairly significant associated fatigue.  She has a home sleep study planned by cardiology and I suspect that she will qualify for CPAP.  Discussed this with her today.  Encouraged her to do so

## 2024-02-16 NOTE — Progress Notes (Signed)
 Subjective:    Patient ID: Nicole [name], female    DOB: February 04, 1956, 68 y.o.   MRN: [id number]  Asthma She complains of cough and wheezing. There is no shortness of breath. Pertinent negatives include no ear pain, fever, headaches, postnasal drip, rhinorrhea, sneezing, sore throat or trouble swallowing. Her past medical history is significant for asthma.   ROV 02/16/24 --follow-up visit 68 year old woman with history of tobacco use, COPD/asthma, chronic cough with upper airway irritability in the setting of rhinitis, GERD.  PMH also significant for hypertension, aortic atherosclerosis followed by cardiology, Crohn's dz.  She underwent a cardiac coronary morphology CTA 02/12/2024 as below.  Cardiology ordered a sleep study for her as well.  Today she reports that she is having LE pain from OA and knee injury - has not been able to work. Smoking less than 1 pk/day. Having more mucous and cough, brownish sputum. Sensitive to perfumes. No steroids, no abx. She has had a lot of fatigue. She snores loudly.  Currently managed on Advair, remains on nexium, flonase prn, astelin.   CTA chest/coronary 02/12/2024 reviewed by me showed coronary artery disease (24th percentile).  Radiology read is still pending, some mild subpleural reticulation at both bases present  PFT 05/2021 with moderately severe obstruction and a positive bronchodilator response.   Review of Systems  Constitutional:  Negative for fever and unexpected weight change.  HENT:  Negative for congestion, dental problem, ear pain, nosebleeds, postnasal drip, rhinorrhea, sinus pressure, sneezing, sore throat and trouble swallowing.   Eyes:  Negative for redness and itching.  Respiratory:  Positive for cough and wheezing. Negative for chest tightness and shortness of breath.   Cardiovascular:  Negative for palpitations and leg swelling.  Gastrointestinal:  Negative for nausea and vomiting.  Genitourinary:  Negative for dysuria.   Musculoskeletal:  Negative for joint swelling.  Skin:  Negative for rash.  Neurological:  Negative for headaches.  Hematological:  Does not bruise/bleed easily.  Psychiatric/Behavioral:  Negative for dysphoric mood. The patient is not nervous/anxious.        Objective:   Physical Exam Vitals:   02/16/24 0829  BP: 121/74  Pulse: 63  SpO2: 97%  Weight: 174 lb (78.9 kg)  Height: 5\' 6"  (1.676 m)    Gen: Pleasant, well-nourished, in no distress,  normal affect  ENT: No lesions,  mouth clear,  oropharynx clear, no postnasal drip  Neck: No JVD, no stridor  Lungs: No use of accessory muscles, clear without rales or rhonchi  Cardiovascular: RRR, soft blowing systolic M with intact S1S2  Musculoskeletal: No deformities, no cyanosis or clubbing  Neuro: alert, non focal  Skin: Warm, no lesions or rashes      Assessment & Plan:  COPD with asthma No flares but she has been more symptomatic in the last 6 months since she started smoking again.  Talked her about cessation today.  We tried her on Breztri in the past and she prefers it but is expensive.  She has a medication deductible to meet.  We will give her samples today, try to get her back on this and hopefully it will be affordable once a deductible is met.  If not she has been using Wixela as her alternative.  Allergic rhinitis Using Astelin nasal spray.  I asked her to add back fluticasone nasal spray  GERD (gastroesophageal reflux disease) Very good control on Nexium.  Plan to continue.  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  Working on treating both  Snoring With some fairly significant associated fatigue.  She has a home sleep study planned by cardiology and I suspect that she will qualify for CPAP.  Discussed this with her today.  Encouraged her to do so   Levy Pupa, MD, PhD 02/16/2024, 8:54 AM [facility] [phone] or if no answer [phone]

## 2024-02-16 NOTE — Assessment & Plan Note (Signed)
 Very good control on Nexium.  Plan to continue.

## 2024-02-16 NOTE — Assessment & Plan Note (Signed)
 No flares but she has been more symptomatic in the last 6 months since she started smoking again.  Talked her about cessation today.  We tried her on Breztri in the past and she prefers it but is expensive.  She has a medication deductible to meet.  We will give her samples today, try to get her back on this and hopefully it will be affordable once a deductible is met.  If not she has been using Wixela as her alternative.

## 2024-02-17 ENCOUNTER — Encounter (INDEPENDENT_AMBULATORY_CARE_PROVIDER_SITE_OTHER): Payer: Self-pay | Admitting: Cardiology

## 2024-02-17 DIAGNOSIS — R0683 Snoring: Secondary | ICD-10-CM

## 2024-02-18 NOTE — Procedures (Signed)
   SLEEP STUDY REPORT Patient Information Study Date: 02/17/2024 Patient Name: Nicole [name] Patient ID: [id number] Birth Date: 10-22-1956 Age: 68 Gender: Female BMI: 28.0 (W=174 lb, H=5' 6'') Stopbang: 3 Referring Physician: Rosemary Holms, MD  TEST DESCRIPTION: Home sleep apnea testing was completed using the WatchPat, a Type 1 device, utilizing peripheral arterial tonometry (PAT), chest movement, actigraphy, pulse oximetry, pulse rate, body position and snore. AHI was calculated with apnea and hypopnea using valid sleep time as the denominator. RDI includes apneas, hypopneas, and RERAs. The data acquired and the scoring of sleep and all associated events were performed in accordance with the recommended standards and specifications as outlined in the AASM Manual for the Scoring of Sleep and Associated Events 2.2.0 (2015).  FINDINGS: 1. No evidence of Obstructive Sleep Apnea with AHI 1.2/hr. 2. No Central Sleep Apnea. 3. Oxygen desaturations as low as 89%. 4. Moderate to severe snoring was present. O2 sats were < 88% for 0 minutes. 5. Total sleep time was 5 hrs and 55 min. 6. 16.2% of total sleep time was spent in REM sleep. 7. Shortened sleep onset latency at 6 min. 8. Prolonged REM sleep onset latency at 160 min. 9. Total awakenings were 7.  DIAGNOSIS: Normal study with no significant sleep disordered breathing.  RECOMMENDATIONS: 1. Normal study with no significant sleep disordered breathing.  2. Healthy sleep recommendations include: adequate nightly sleep (normal 7-9 hrs/night), avoidance of caffeine after noon and alcohol near bedtime, and maintaining a sleep environment that is cool, dark and quiet.  3. Weight loss for overweight patients is recommended.  4. Snoring recommendations include: weight loss where appropriate, side sleeping, and avoidance of alcohol before bed.  5. Operation of motor vehicle or dangerous equipment must be avoided when feeling drowsy,  excessively sleepy, or mentally fatigued.  6. An ENT consultation which may be useful for specific causes of and possible treatment of bothersome snoring .  7. Weight loss may be of benefit in reducing the severity of snoring.   Signature: Armanda Magic, MD; [facility]; Diplomat, American Board of Sleep Medicine Electronically Signed: 02/18/2024 9:15:57 AM

## 2024-02-19 ENCOUNTER — Ambulatory Visit: Attending: Cardiology

## 2024-02-19 ENCOUNTER — Telehealth: Payer: Self-pay | Admitting: *Deleted

## 2024-02-19 DIAGNOSIS — R0683 Snoring: Secondary | ICD-10-CM

## 2024-02-19 NOTE — Progress Notes (Signed)
 Normal pumping function of the heart. No severe heart valve abnormalities noted.  Coronary CT angiogram with mild and plaque buildup, no significant blockages. Aspirin not necessary, but could benefit from low-dose statin, Crestor 10 mg daily, initiated to heartedly diet and lifestyle, to reduce long-term risk of heart disease. Symptoms unlikely to be related to cardiac etiology.  Thanks MJP

## 2024-02-19 NOTE — Telephone Encounter (Signed)
 The patient has been notified of the result and verbalized understanding.  All questions (if any) were answered. Nicole [name], CMA 02/19/2024 6:52 PM    Patient understands her study showed no significant sleep apnea.

## 2024-02-19 NOTE — Telephone Encounter (Signed)
-----   Message from Armanda Magic sent at 02/18/2024  9:17 AM EDT ----- Please let patient know that sleep study showed no significant sleep apnea.

## 2024-02-23 ENCOUNTER — Other Ambulatory Visit: Payer: Self-pay

## 2024-02-29 ENCOUNTER — Other Ambulatory Visit: Payer: Self-pay

## 2024-02-29 DIAGNOSIS — L565 Disseminated superficial actinic porokeratosis (DSAP): Secondary | ICD-10-CM | POA: Diagnosis not present

## 2024-02-29 DIAGNOSIS — I8311 Varicose veins of right lower extremity with inflammation: Secondary | ICD-10-CM | POA: Diagnosis not present

## 2024-02-29 DIAGNOSIS — L821 Other seborrheic keratosis: Secondary | ICD-10-CM | POA: Diagnosis not present

## 2024-02-29 DIAGNOSIS — Z85828 Personal history of other malignant neoplasm of skin: Secondary | ICD-10-CM | POA: Diagnosis not present

## 2024-02-29 DIAGNOSIS — L80 Vitiligo: Secondary | ICD-10-CM | POA: Diagnosis not present

## 2024-02-29 DIAGNOSIS — I8312 Varicose veins of left lower extremity with inflammation: Secondary | ICD-10-CM | POA: Diagnosis not present

## 2024-02-29 DIAGNOSIS — D692 Other nonthrombocytopenic purpura: Secondary | ICD-10-CM | POA: Diagnosis not present

## 2024-02-29 DIAGNOSIS — L570 Actinic keratosis: Secondary | ICD-10-CM | POA: Diagnosis not present

## 2024-02-29 DIAGNOSIS — I872 Venous insufficiency (chronic) (peripheral): Secondary | ICD-10-CM | POA: Diagnosis not present

## 2024-02-29 MED ORDER — TRIAMCINOLONE ACETONIDE 0.025 % EX CREA
TOPICAL_CREAM | Freq: Two times a day (BID) | CUTANEOUS | 0 refills | Status: AC
Start: 2024-02-29 — End: ?
  Filled 2024-02-29: qty 454, 90d supply, fill #0

## 2024-02-29 NOTE — Telephone Encounter (Signed)
 Results in chart

## 2024-03-01 ENCOUNTER — Other Ambulatory Visit: Payer: Self-pay

## 2024-03-01 DIAGNOSIS — M25561 Pain in right knee: Secondary | ICD-10-CM | POA: Diagnosis not present

## 2024-03-04 ENCOUNTER — Other Ambulatory Visit: Payer: Self-pay

## 2024-03-04 MED ORDER — BREZTRI AEROSPHERE 160-9-4.8 MCG/ACT IN AERO
2.0000 | INHALATION_SPRAY | Freq: Two times a day (BID) | RESPIRATORY_TRACT | 6 refills | Status: AC
  Filled 2024-03-04: qty 10.7, 30d supply, fill #0
  Filled 2024-05-02: qty 10.7, 30d supply, fill #1
  Filled 2024-06-26 (×2): qty 10.7, 30d supply, fill #2
  Filled 2024-08-29: qty 10.7, 30d supply, fill #3
  Filled 2024-10-23: qty 10.7, 30d supply, fill #0
  Filled 2024-11-25: qty 10.7, 30d supply, fill #1

## 2024-03-04 NOTE — Telephone Encounter (Signed)
 Copied from CRM [id number]. Topic: Clinical - Prescription Issue >> Mar 04, 2024  8:03 AM Crist Dominion wrote: Reason for CRM: Patient states when she was seen by Dr. Baldwin Levee on 4/4 he told her he would order her the (BREZTRI  AEROSPHERE) 160-9-4.8 MCG/ACT AERO, but the patient states her pharmacy has not received this medication.  Please send this to [facility] [address] [place] [address] Phone: [phone] Fax: [phone]  *Patient states she is going out of town and hopes to pick this up today and if not available is inquiring about a sample of this Breztri  at the clinic. Sent in a script for Breztri  to patient's pharmacy and patient stated she will come to the office tomorrow to pick up samples. Patient's voice was understanding.Nothing else further needed.

## 2024-03-15 DIAGNOSIS — S83241A Other tear of medial meniscus, current injury, right knee, initial encounter: Secondary | ICD-10-CM | POA: Diagnosis not present

## 2024-03-27 ENCOUNTER — Other Ambulatory Visit: Payer: Self-pay

## 2024-03-27 ENCOUNTER — Other Ambulatory Visit: Payer: Self-pay | Admitting: Emergency Medicine

## 2024-03-27 MED ORDER — CITALOPRAM HYDROBROMIDE 20 MG PO TABS
20.0000 mg | ORAL_TABLET | Freq: Every day | ORAL | 1 refills | Status: DC
  Filled 2024-03-27: qty 90, 90d supply, fill #0

## 2024-03-27 MED ORDER — APRISO 0.375 G PO CP24
1.5000 g | ORAL_CAPSULE | Freq: Every morning | ORAL | 3 refills | Status: AC
Start: 2024-03-27 — End: ?
  Filled 2024-03-27 – 2024-05-27 (×2): qty 360, 90d supply, fill #0
  Filled 2024-07-31 – 2024-08-03 (×2): qty 360, 90d supply, fill #1

## 2024-03-28 ENCOUNTER — Other Ambulatory Visit: Payer: Self-pay

## 2024-03-28 MED ORDER — ALBUTEROL SULFATE HFA 108 (90 BASE) MCG/ACT IN AERS
2.0000 | INHALATION_SPRAY | Freq: Four times a day (QID) | RESPIRATORY_TRACT | 6 refills | Status: AC | PRN
  Filled 2024-03-28: qty 6.7, 25d supply, fill #0
  Filled 2024-05-02: qty 6.7, 25d supply, fill #1
  Filled 2024-06-26 (×2): qty 6.7, 25d supply, fill #2
  Filled 2024-10-23: qty 6.7, 25d supply, fill #0
  Filled 2024-11-25: qty 6.7, 25d supply, fill #1

## 2024-04-01 ENCOUNTER — Other Ambulatory Visit: Payer: Self-pay

## 2024-04-12 DIAGNOSIS — M5441 Lumbago with sciatica, right side: Secondary | ICD-10-CM | POA: Diagnosis not present

## 2024-04-13 DIAGNOSIS — L03113 Cellulitis of right upper limb: Secondary | ICD-10-CM | POA: Diagnosis not present

## 2024-04-13 DIAGNOSIS — W540XXA Bitten by dog, initial encounter: Secondary | ICD-10-CM | POA: Diagnosis not present

## 2024-04-23 DIAGNOSIS — M79605 Pain in left leg: Secondary | ICD-10-CM | POA: Diagnosis not present

## 2024-04-23 DIAGNOSIS — I10 Essential (primary) hypertension: Secondary | ICD-10-CM | POA: Diagnosis not present

## 2024-04-23 DIAGNOSIS — S8992XA Unspecified injury of left lower leg, initial encounter: Secondary | ICD-10-CM | POA: Diagnosis not present

## 2024-04-23 DIAGNOSIS — Z888 Allergy status to other drugs, medicaments and biological substances status: Secondary | ICD-10-CM | POA: Diagnosis not present

## 2024-04-23 DIAGNOSIS — M25552 Pain in left hip: Secondary | ICD-10-CM | POA: Diagnosis not present

## 2024-04-23 DIAGNOSIS — Z79899 Other long term (current) drug therapy: Secondary | ICD-10-CM | POA: Diagnosis not present

## 2024-04-23 DIAGNOSIS — Z96642 Presence of left artificial hip joint: Secondary | ICD-10-CM | POA: Diagnosis not present

## 2024-04-23 DIAGNOSIS — M79662 Pain in left lower leg: Secondary | ICD-10-CM | POA: Diagnosis not present

## 2024-04-23 DIAGNOSIS — R58 Hemorrhage, not elsewhere classified: Secondary | ICD-10-CM | POA: Diagnosis not present

## 2024-04-23 DIAGNOSIS — S81812A Laceration without foreign body, left lower leg, initial encounter: Secondary | ICD-10-CM | POA: Diagnosis not present

## 2024-04-23 DIAGNOSIS — M79652 Pain in left thigh: Secondary | ICD-10-CM | POA: Diagnosis not present

## 2024-04-23 DIAGNOSIS — I878 Other specified disorders of veins: Secondary | ICD-10-CM | POA: Diagnosis not present

## 2024-04-23 DIAGNOSIS — W19XXXA Unspecified fall, initial encounter: Secondary | ICD-10-CM | POA: Diagnosis not present

## 2024-04-24 DIAGNOSIS — S81812A Laceration without foreign body, left lower leg, initial encounter: Secondary | ICD-10-CM | POA: Diagnosis not present

## 2024-04-25 DIAGNOSIS — Z5189 Encounter for other specified aftercare: Secondary | ICD-10-CM | POA: Diagnosis not present

## 2024-04-28 DIAGNOSIS — I2510 Atherosclerotic heart disease of native coronary artery without angina pectoris: Secondary | ICD-10-CM | POA: Insufficient documentation

## 2024-04-28 NOTE — Progress Notes (Signed)
 "      OFFICE NOTE:    Date:  04/29/2024  ID:  Nicole [name], DOB 19-May-1956, MRN [id number] PCP: Vernon Velna SAUNDERS, MD  [facility] Providers Cardiologist:  Ozell Fell, MD Cardiology APP:  Lelon Glendia DASEN, PA-C       Patient Profile:  Coronary artery disease   TTE 09/19/2022: EF 70-75, no RWMA, normal RVSF, trivial MR, AV sclerosis, RAP 3 CCTA 02/12/24: CAC score 218 (86th percentile); TPV 412 mm3 (71st percentile; Ca2+ 57 mm3; non-Ca2+ 355 mm3) - Severe; minimal nonobstructive CAD (LAD, RCA, LCx 0-24) TTE 02/16/24: EF 60-65, no RWMA, NL RVSF, AV sclerosis , ascending aorta 38 mm (borderline) Hypertension  Asthma Aortic atherosclerosis  Obesity Crohn's disease   Home sleep study 02/2024: no OSA       Discussed the use of AI scribe software for clinical note transcription with the patient, who gave verbal consent to proceed. History of Present Illness Nicole [name] is a 68 y.o. female who returns for follow up of CAD. She was last seen by Dr. Elmira in 01/2024 for chest pain and shortness of breath. TTE was obtained and showed normal EF and no valvular disease. CCTA was obtained and showed elevated CAC and minimal nonobstructive CAD. Home sleep study was done for fatigue. This showed no OSA.   She is here alone. She has not taken statins or other cholesterol medications and plans to manage her cholesterol through diet and exercise. She recently sustained a lower L leg injury [place]. She has resumed smoking but is trying to quit. She has lost significant weight through dietary changes, including eating more fish and less red meat and processed foods. No current chest symptoms, shortness of breath, or syncope.   ROS-See HPI    Studies Reviewed:      Results LABS Total cholesterol: 176 (02/09/2024) HDL: 59 (02/09/2024) LDL: 98 (02/09/2024) ALT: 11 (02/09/2024)  Risk Assessment/Calculations:       STOP-Bang Score:  3      Physical Exam:  VS:  BP (!) 100/56   Pulse 66   Ht 5' 6 (1.676 m)   Wt 173 lb 6.4 oz (78.7 kg)   SpO2 95%   BMI 27.99 kg/m    Wt Readings from Last 3 Encounters:  04/29/24 173 lb 6.4 oz (78.7 kg)  02/16/24 174 lb (78.9 kg)  01/26/24 174 lb (78.9 kg)    Constitutional:      Appearance: Healthy appearance. Not in distress.  Neck:     Vascular: JVD normal.  Pulmonary:     Breath sounds: Normal breath sounds. No wheezing. No rales.  Cardiovascular:     Normal rate. Regular rhythm.     Murmurs: There is a grade 1/6 systolic murmur at the URSB.     Comments: L lower leg with bandage in place Edema:    Peripheral edema absent.  Abdominal:     Palpations: Abdomen is soft.        Assessment and Plan:    Assessment & Plan Coronary artery disease involving native coronary artery of native heart without angina pectoris Recent coronary CTA revealed a calcium score of 218 (86th percentile) and severe total plaque volume of 412 cubic millimeters (71st percentile). She has minimal non-obstructive CAD and is asymptomatic without angina. Emphasized risk factor modification, particularly reducing LDL cholesterol to <70 mg/dL. She is not on cholesterol-lowering medications and prefers dietary and exercise management due to personal concerns  about statins. - Encourage dietary and lifestyle modifications to lower cholesterol. - Follow-up 12 months Primary hypertension Blood pressure is well-controlled - Contine Diltiazem  180 mg daily and losartan  100 mg daily. Tobacco use Advise smoking cessation.       Dispo:  Return in about 1 year (around 04/29/2025) for Routine Follow Up, w/ Dr. Wonda, or Glendia Ferrier, PA-C.  Signed, Glendia Ferrier, PA-C   "

## 2024-04-29 ENCOUNTER — Other Ambulatory Visit: Payer: Self-pay

## 2024-04-29 ENCOUNTER — Encounter: Payer: Self-pay | Admitting: Physician Assistant

## 2024-04-29 ENCOUNTER — Ambulatory Visit: Attending: Physician Assistant | Admitting: Physician Assistant

## 2024-04-29 VITALS — BP 100/56 | HR 66 | Ht 66.0 in | Wt 173.4 lb

## 2024-04-29 DIAGNOSIS — I8310 Varicose veins of unspecified lower extremity with inflammation: Secondary | ICD-10-CM | POA: Diagnosis not present

## 2024-04-29 DIAGNOSIS — I2510 Atherosclerotic heart disease of native coronary artery without angina pectoris: Secondary | ICD-10-CM

## 2024-04-29 DIAGNOSIS — I10 Essential (primary) hypertension: Secondary | ICD-10-CM

## 2024-04-29 DIAGNOSIS — S81812D Laceration without foreign body, left lower leg, subsequent encounter: Secondary | ICD-10-CM | POA: Diagnosis not present

## 2024-04-29 DIAGNOSIS — F17210 Nicotine dependence, cigarettes, uncomplicated: Secondary | ICD-10-CM | POA: Diagnosis not present

## 2024-04-29 DIAGNOSIS — K5000 Crohn's disease of small intestine without complications: Secondary | ICD-10-CM | POA: Diagnosis not present

## 2024-04-29 DIAGNOSIS — Z720 Tobacco use: Secondary | ICD-10-CM

## 2024-04-29 DIAGNOSIS — E7800 Pure hypercholesterolemia, unspecified: Secondary | ICD-10-CM

## 2024-04-29 MED ORDER — CLOBETASOL PROPIONATE 0.05 % EX OINT
TOPICAL_OINTMENT | Freq: Two times a day (BID) | CUTANEOUS | 0 refills | Status: AC
  Filled 2024-04-29: qty 45, 30d supply, fill #0

## 2024-04-29 NOTE — Assessment & Plan Note (Signed)
 Recent coronary CTA revealed a calcium score of 218 (86th percentile) and severe total plaque volume of 412 cubic millimeters (71st percentile). She has minimal non-obstructive CAD and is asymptomatic without angina. Emphasized risk factor modification, particularly reducing LDL cholesterol to <70 mg/dL. She is not on cholesterol-lowering medications and prefers dietary and exercise management due to personal concerns about statins. - Encourage dietary and lifestyle modifications to lower cholesterol. - Follow-up 12 months

## 2024-04-29 NOTE — Assessment & Plan Note (Signed)
 Blood pressure is well-controlled - Contine Diltiazem  180 mg daily and losartan  100 mg daily.

## 2024-04-29 NOTE — Patient Instructions (Signed)
 Medication Instructions:  Your physician recommends that you continue on your current medications as directed. Please refer to the Current Medication list given to you today.  *If you need a refill on your cardiac medications before your next appointment, please call your pharmacy*  Lab Work: None ordered.  If you have labs (blood work) drawn today and your tests are completely normal, you will receive your results only by: MyChart Message (if you have MyChart) OR A paper copy in the mail If you have any lab test that is abnormal or we need to change your treatment, we will call you to review the results.  Testing/Procedures: .none   Follow-Up: At [facility], you and your health needs are our priority.  As part of our continuing mission to provide you with exceptional heart care, our providers are all part of one team.  This team includes your primary Cardiologist (physician) and Advanced Practice Providers or APPs (Physician Assistants and Nurse Practitioners) who all work together to provide you with the care you need, when you need it.  Your next appointment:   12 months with Dr Arlester Ladd

## 2024-04-30 ENCOUNTER — Other Ambulatory Visit: Payer: Self-pay

## 2024-05-01 DIAGNOSIS — Z96642 Presence of left artificial hip joint: Secondary | ICD-10-CM | POA: Diagnosis not present

## 2024-05-01 DIAGNOSIS — M25561 Pain in right knee: Secondary | ICD-10-CM | POA: Diagnosis not present

## 2024-05-02 ENCOUNTER — Other Ambulatory Visit: Payer: Self-pay

## 2024-05-03 DIAGNOSIS — T148XXA Other injury of unspecified body region, initial encounter: Secondary | ICD-10-CM | POA: Diagnosis not present

## 2024-05-15 DIAGNOSIS — S81812D Laceration without foreign body, left lower leg, subsequent encounter: Secondary | ICD-10-CM | POA: Diagnosis not present

## 2024-05-20 DIAGNOSIS — S81812D Laceration without foreign body, left lower leg, subsequent encounter: Secondary | ICD-10-CM | POA: Diagnosis not present

## 2024-05-22 DIAGNOSIS — M25561 Pain in right knee: Secondary | ICD-10-CM | POA: Diagnosis not present

## 2024-05-22 DIAGNOSIS — F17210 Nicotine dependence, cigarettes, uncomplicated: Secondary | ICD-10-CM | POA: Diagnosis not present

## 2024-05-22 DIAGNOSIS — S81802D Unspecified open wound, left lower leg, subsequent encounter: Secondary | ICD-10-CM | POA: Diagnosis not present

## 2024-05-27 ENCOUNTER — Other Ambulatory Visit: Payer: Self-pay | Admitting: Physician Assistant

## 2024-05-27 ENCOUNTER — Other Ambulatory Visit: Payer: Self-pay

## 2024-05-27 ENCOUNTER — Ambulatory Visit: Admission: EM | Admit: 2024-05-27 | Discharge: 2024-05-27 | Disposition: A | Discharge status: Home / Self Care

## 2024-05-27 ENCOUNTER — Encounter: Payer: Self-pay | Admitting: Emergency Medicine

## 2024-05-27 DIAGNOSIS — S81812A Laceration without foreign body, left lower leg, initial encounter: Secondary | ICD-10-CM

## 2024-05-27 NOTE — Discharge Instructions (Signed)
 This time I do not believe your wound is infected but does have delayed healing  Continue Augmentin  as prescribed  You have been given information to different wound care center to see if they are able to see you more quickly, please call the number on the from the paperwork,  Continue all current wound care as you are currently doing

## 2024-05-27 NOTE — ED Triage Notes (Signed)
 Patient here for a wound check on left leg. Injury happened on June 10 th.

## 2024-05-27 NOTE — ED Provider Notes (Signed)
 CAY RALPH PELT    CSN: [id number] Arrival date & time: 05/27/24  1332      History   Chief Complaint No chief complaint on file.   HPI Nicole [name] is a 68 y.o. female.   Presents for evaluation of a wound to the left leg that occurred on April 23, 2024.  Concern is over the past 5 days she has noticed black eschar.  Was evaluated by her doctor 5 days ago, currently taking Augmentin .  Has taken cephalexin  and Bactrim since symptoms began.  Denies purulent drainage, swelling, pain.  Has been cleansing daily applying bacitracin  and covering with Telfa.  Has upcoming wound care appointment but unable to be seen until late August.  Past Medical History:  Diagnosis Date   Anxiety    Arthritis    lumbar- HNP, /w myelopathy   Asthma    /w reflux , consult /w Dr. Marcheta / RESA INHALER PRN INFREQUENTLY   Bronchitis    used albuterol  , Sept. 2013, all clear now    Complication of anesthesia    Difficulty sleeping    DUE TO PAIN   Family history of anesthesia complication    N&V- Mother    GERD (gastroesophageal reflux disease)    Chron's disease, not medically treating currently    H/O hiatal hernia    small hiatal hernia, has had endoscopy & colondoscopy   H/O vertigo    Heart murmur    since birth   Hypertension    followed by Dr. 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   Aortic atherosclerosis (HCC) 04/17/2023   Aortic valve sclerosis 04/17/2023   Abnormal cervical Papanicolaou smear 09/19/2022   Arthritis 09/19/2022   Irritable  bowel syndrome 09/19/2022   Pain of left hip joint 07/30/2022   Acute asthmatic bronchitis 04/25/2022   SUI (stress urinary incontinence, female) 01/12/2022   Closed fracture of tibial plateau 12/17/2021   Pain in left knee 12/08/2021   Crohn's disease (HCC) 08/04/2020   Pain in right knee 04/16/2019   Influenza A 11/20/2018   Post-nasal drip 07/25/2016   Irritable larynx 07/25/2016   Soft tissue abscess of inguinal region 09/26/2015   S/P left THA, AA 12/24/2014   Lumbosacral radiculitis 02/27/2014   Thoracic radiculitis 11/19/2013   Herpes zoster 08/29/2013   Low back pain 06/13/2013   Dizziness and giddiness 02/07/2013   Cough 09/23/2011   Hypertension 08/11/2011   Obesity 08/11/2011    Past Surgical History:  Procedure Laterality Date   BACK SURGERY  2013   BREAST BIOPSY Left 05/30/2023   US  LT BREAST BX W LOC DEV 1ST LESION IMG BX SPEC US  GUIDE 05/30/2023 [facility]   CHOLECYSTECTOMY  03   laparoscopic   LUMBAR LAMINECTOMY/DECOMPRESSION MICRODISCECTOMY  09/10/2012   Procedure: LUMBAR LAMINECTOMY/DECOMPRESSION MICRODISCECTOMY 1 LEVEL;  Surgeon: Victory Gens, MD;  Location: [facility];  Service: Neurosurgery;  Laterality: Left;  Left Lumbar Five-Sacral One Microdiscectomy   TOTAL HIP ARTHROPLASTY Left 12/24/2014   Procedure: LEFT TOTAL HIP ARTHROPLASTY ANTERIOR APPROACH;  Surgeon: Donnice JONETTA Car, MD;  Location: [facility];  Service: Orthopedics;  Laterality: Left;    OB History   No obstetric history on file.      Home Medications    Prior to Admission medications   Medication Sig Start Date End Date Taking? Authorizing Provider  albuterol  (PROVENTIL ) (2.5 MG/3ML) 0.083% nebulizer solution Take 3 mLs (2.5 mg total) by nebulization every 6 (six) hours as needed for wheezing or shortness of breath. 04/12/23   Meade Verdon RAMAN, MD  albuterol  (VENTOLIN  HFA) 108 (90 Base) MCG/ACT inhaler Inhale 2 puffs into the lungs every 6 hours as needed for wheezing or shortness of  breath. 03/28/24   Neda Jennet LABOR, MD  ALPRAZolam  (XANAX ) 0.5 MG tablet Take 1 tablet by mouth 3 times daily as needed (must last 90 days). 03/24/22     amoxicillin -clavulanate (AUGMENTIN ) 875-125 MG tablet Take 1 tablet by mouth 2 (two) times daily.    [provider]  APRISO  0.375 g 24 hr capsule Take 4 capsules (1.5 g total) by mouth every morning. 03/27/24     aspirin  EC 81 MG tablet 1 tablet 3 times a week Patient not taking: Reported on 04/29/2024 04/17/23   Lelon Hamilton T, PA-C  azelastine  (ASTELIN ) 0.1 % nasal spray Place 1 spray into both nostrils 2 (two) times daily as needed. Patient not taking: Reported on 04/29/2024 01/16/23     budesonide -glycopyrrolate -formoterol  (BREZTRI  AEROSPHERE) 160-9-4.8 MCG/ACT AERO inhaler Inhale 2 puffs into the lungs in the morning and at bedtime. 03/04/24   Byrum, Robert S, MD  budeson-glycopyrrolate -formoterol  (BREZTRI  AEROSPHERE) 160-9-4.8 MCG/ACT AERO 2 samples provided 02/16/24   Shelah Lamar RAMAN, MD  citalopram  (CELEXA ) 20 MG tablet Take 1 tablet (20 mg total) by mouth daily. 04/12/23     citalopram  (CELEXA ) 20 MG tablet Take 1 tablet (20 mg total) by mouth daily. 12/17/23   Vincente Grip, MD  clobetasol  ointment (TEMOVATE ) 0.05 % Apply 1 Application (thin layer) topically to affected areas 2 (two) times daily. 07/05/23     clobetasol  ointment (TEMOVATE ) 0.05 % APPLY A THIN LAYER TO THE AFFECTED AREA(S) BY TOPICAL ROUTE 2 TIMES PER DAY. 04/29/24     diltiazem  (CARDIZEM  CD) 180 MG 24 hr capsule Take 1 capsule (180 mg total) by mouth daily. 04/17/23   Lelon Hamilton T, PA-C  fluticasone -salmeterol (ADVAIR ) 250-50 MCG/ACT AEPB Inhale 1 puff into the lungs in the morning and at bedtime. Patient not taking: Reported on 04/29/2024    [provider]  losartan  (COZAAR ) 100 MG tablet Take 1 tablet (100 mg total) by mouth daily. 04/17/23   Lelon Hamilton T, PA-C  mesalamine  (APRISO ) 0.375 g 24 hr capsule Take 4 capsules (1.5 g total) by mouth every morning.  08/02/23     metoprolol  tartrate (LOPRESSOR ) 100 MG tablet Take 1 tablet (100 mg total) by mouth once for 1 dose. Take 90-120 minutes prior to scan. Hold for SBP less than 110. 02/12/24 02/13/24  Patwardhan, Newman PARAS, MD  mupirocin  ointment (BACTROBAN ) 2 % Apply a  small amount to affected area once a day with bandage changes 08/26/21     nitroGLYCERIN  (NITROSTAT ) 0.4 MG SL tablet Place 1 tablet (0.4 mg total) under the tongue every 5 (five) minutes as needed for chest pain. Patient not taking: Reported on 02/16/2024 01/26/24   Elmira Newman PARAS, MD  traZODone  (DESYREL ) 50 MG tablet Take 1/2-2 tablets by mouth at bedtime as needed 06/28/22     triamcinolone  (KENALOG ) 0.025 % cream Apply 1  a small amount to affected area twice a day. Apply to itchy areas as needed. 02/29/24     valACYclovir  (VALTREX ) 1000 MG tablet Take 2 tablets (2,000 mg total) by mouth 2 (two) times daily for 2 days at first sign of fever blister 07/05/23       Family History Family History  Problem Relation Age of Onset   Kidney disease Mother    Asthma Mother    Thyroid  disease Mother    Glaucoma Mother    Diabetes Father    Stroke Father    Hypertension Father    Kidney disease Father    Hypertension Sister     Social History Social History   Tobacco Use   Smoking status: Every Day    Current packs/day: 0.50    Average packs/day: 1.4 packs/day for 28.5 years (41.3 ttl pk-yrs)    Types: Cigarettes    Start date: 11/14/1972    Last attempt to quit: 11/15/1999    Passive exposure: Never   Smokeless tobacco: Never   Tobacco comments:    3/4 pk cigs per day 02/16/24  Vaping Use   Vaping status: Never Used  Substance Use Topics   Alcohol use: Yes    Comment: Seldom drinks, but occasional wine.   Drug use: No     Allergies   Hctz [hydrochlorothiazide], Hydralazine hcl, Metoclopramide  hcl, Other, Voltaren [diclofenac sodium], Diclofenac sodium, Avelox [moxifloxacin hcl in nacl], Clindamycin, Clindamycin/lincomycin,  Flagyl [metronidazole], and Moxifloxacin   Review of Systems Review of Systems   Physical Exam Triage Vital Signs ED Triage Vitals  Encounter Vitals Group     BP 05/27/24 1343 107/67     Girls Systolic BP Percentile --      Girls Diastolic BP Percentile --      Boys Systolic BP Percentile --      Boys Diastolic BP Percentile --      Pulse Rate 05/27/24 1343 68     Resp 05/27/24 1343 18     Temp 05/27/24 1343 98.7 F (37.1 C)     Temp Source 05/27/24 1343 Oral     SpO2 05/27/24 1343 96 %     Weight --      Height --      Head Circumference --      Peak Flow --      Pain Score 05/27/24 1345 0     Pain Loc --      Pain Education --      Exclude from Growth Chart --    No data found.  Updated Vital Signs BP 107/67 (BP Location: Right Arm)   Pulse 68   Temp 98.7 F (37.1 C) (Oral)   Resp 18   SpO2 96%   Visual Acuity Right Eye Distance:   Left Eye Distance:   Bilateral Distance:    Right Eye Near:   Left Eye Near:    Bilateral Near:     Physical Exam Constitutional:      Appearance: Normal appearance.  Eyes:     Extraocular Movements: Extraocular movements intact.  Pulmonary:     Effort: Pulmonary effort is normal.  Skin:    Comments: Defer to photo  Neurological:     Mental Status: She is alert.      UC Treatments / Results  Labs (all labs ordered are listed, but only abnormal results are displayed) Labs Reviewed - No data to display  EKG   Radiology No results found.  Procedures Procedures (including critical  care time)  Medications Ordered in UC Medications - No data to display  Initial Impression / Assessment and Plan / UC Course  I have reviewed the triage vital signs and the nursing notes.  Pertinent labs & imaging results that were available during my care of the patient were reviewed by me and considered in my medical decision making (see chart for details).  Noninfected skin tear of left lower extremity  No signs of  infection such as purulent drainage, redness swelling or pain, at this time will not change antibiotic advise continuation of Augmentin  as prescribed, advised continued daily wound care, based on presentation patient will most likely benefit for debridement, given information to a additional local wound care center to see if she can be seen sooner than late August otherwise advised to keep appointments with primary doctor and continue current treatment plan Final Clinical Impressions(s) / UC Diagnoses   Final diagnoses:  Noninfected skin tear of left lower extremity, initial encounter     Discharge Instructions      This time I do not believe your wound is infected but does have delayed healing  Continue Augmentin  as prescribed  You have been given information to different wound care center to see if they are able to see you more quickly, please call the number on the from the paperwork,  Continue all current wound care as you are currently doing   ED Prescriptions   None    PDMP not reviewed this encounter.   Teresa Shelba SAUNDERS, NP 05/27/24 1420

## 2024-05-28 ENCOUNTER — Other Ambulatory Visit: Payer: Self-pay

## 2024-05-29 ENCOUNTER — Other Ambulatory Visit: Payer: Self-pay

## 2024-05-29 MED FILL — Losartan Potassium Tab 100 MG: ORAL | 90 days supply | Qty: 90 | Fill #0 | Status: CN

## 2024-05-29 MED FILL — Diltiazem HCl Coated Beads Cap ER 24HR 180 MG: ORAL | 90 days supply | Qty: 90 | Fill #0 | Status: CN

## 2024-05-30 ENCOUNTER — Other Ambulatory Visit: Payer: Self-pay

## 2024-05-30 DIAGNOSIS — S81812D Laceration without foreign body, left lower leg, subsequent encounter: Secondary | ICD-10-CM | POA: Diagnosis not present

## 2024-06-03 DIAGNOSIS — S40811A Abrasion of right upper arm, initial encounter: Secondary | ICD-10-CM | POA: Diagnosis not present

## 2024-06-03 DIAGNOSIS — S92111A Displaced fracture of neck of right talus, initial encounter for closed fracture: Secondary | ICD-10-CM | POA: Diagnosis not present

## 2024-06-03 DIAGNOSIS — R58 Hemorrhage, not elsewhere classified: Secondary | ICD-10-CM | POA: Diagnosis not present

## 2024-06-03 DIAGNOSIS — S8991XA Unspecified injury of right lower leg, initial encounter: Secondary | ICD-10-CM | POA: Diagnosis not present

## 2024-06-03 DIAGNOSIS — W19XXXA Unspecified fall, initial encounter: Secondary | ICD-10-CM | POA: Diagnosis not present

## 2024-06-03 DIAGNOSIS — S80812A Abrasion, left lower leg, initial encounter: Secondary | ICD-10-CM | POA: Diagnosis not present

## 2024-06-03 DIAGNOSIS — S80811A Abrasion, right lower leg, initial encounter: Secondary | ICD-10-CM | POA: Diagnosis not present

## 2024-06-03 DIAGNOSIS — S99921A Unspecified injury of right foot, initial encounter: Secondary | ICD-10-CM | POA: Diagnosis not present

## 2024-06-03 DIAGNOSIS — S50811A Abrasion of right forearm, initial encounter: Secondary | ICD-10-CM | POA: Diagnosis not present

## 2024-06-04 DIAGNOSIS — T148XXA Other injury of unspecified body region, initial encounter: Secondary | ICD-10-CM | POA: Diagnosis not present

## 2024-06-04 DIAGNOSIS — S92114D Nondisplaced fracture of neck of right talus, subsequent encounter for fracture with routine healing: Secondary | ICD-10-CM | POA: Diagnosis not present

## 2024-06-04 DIAGNOSIS — M25562 Pain in left knee: Secondary | ICD-10-CM | POA: Diagnosis not present

## 2024-06-06 DIAGNOSIS — R296 Repeated falls: Secondary | ICD-10-CM | POA: Diagnosis not present

## 2024-06-06 DIAGNOSIS — S81812D Laceration without foreign body, left lower leg, subsequent encounter: Secondary | ICD-10-CM | POA: Diagnosis not present

## 2024-06-06 DIAGNOSIS — S92101D Unspecified fracture of right talus, subsequent encounter for fracture with routine healing: Secondary | ICD-10-CM | POA: Diagnosis not present

## 2024-06-10 ENCOUNTER — Other Ambulatory Visit: Payer: Self-pay

## 2024-06-11 ENCOUNTER — Other Ambulatory Visit: Payer: Self-pay

## 2024-06-11 MED FILL — Losartan Potassium Tab 100 MG: ORAL | 90 days supply | Qty: 90 | Fill #0 | Status: AC

## 2024-06-11 MED FILL — Diltiazem HCl Coated Beads Cap ER 24HR 180 MG: ORAL | 90 days supply | Qty: 90 | Fill #0 | Status: AC

## 2024-06-19 DIAGNOSIS — S81812D Laceration without foreign body, left lower leg, subsequent encounter: Secondary | ICD-10-CM | POA: Diagnosis not present

## 2024-06-24 ENCOUNTER — Encounter: Attending: Physician Assistant | Admitting: Physician Assistant

## 2024-06-24 DIAGNOSIS — S81812A Laceration without foreign body, left lower leg, initial encounter: Secondary | ICD-10-CM | POA: Diagnosis not present

## 2024-06-24 DIAGNOSIS — L97812 Non-pressure chronic ulcer of other part of right lower leg with fat layer exposed: Secondary | ICD-10-CM | POA: Diagnosis not present

## 2024-06-24 DIAGNOSIS — L97822 Non-pressure chronic ulcer of other part of left lower leg with fat layer exposed: Secondary | ICD-10-CM | POA: Diagnosis not present

## 2024-06-24 DIAGNOSIS — F17211 Nicotine dependence, cigarettes, in remission: Secondary | ICD-10-CM | POA: Diagnosis not present

## 2024-06-24 DIAGNOSIS — S81811A Laceration without foreign body, right lower leg, initial encounter: Secondary | ICD-10-CM | POA: Insufficient documentation

## 2024-06-26 ENCOUNTER — Other Ambulatory Visit: Payer: Self-pay

## 2024-06-26 ENCOUNTER — Encounter

## 2024-06-26 DIAGNOSIS — S81812A Laceration without foreign body, left lower leg, initial encounter: Secondary | ICD-10-CM | POA: Diagnosis not present

## 2024-06-26 DIAGNOSIS — L97812 Non-pressure chronic ulcer of other part of right lower leg with fat layer exposed: Secondary | ICD-10-CM | POA: Diagnosis not present

## 2024-06-26 DIAGNOSIS — F17211 Nicotine dependence, cigarettes, in remission: Secondary | ICD-10-CM | POA: Diagnosis not present

## 2024-06-26 DIAGNOSIS — S81811A Laceration without foreign body, right lower leg, initial encounter: Secondary | ICD-10-CM | POA: Diagnosis not present

## 2024-06-26 DIAGNOSIS — L97822 Non-pressure chronic ulcer of other part of left lower leg with fat layer exposed: Secondary | ICD-10-CM | POA: Diagnosis not present

## 2024-06-26 MED ORDER — DOXYCYCLINE HYCLATE 100 MG PO CAPS
100.0000 mg | ORAL_CAPSULE | Freq: Two times a day (BID) | ORAL | 0 refills | Status: AC
  Filled 2024-06-26 (×2): qty 14, 7d supply, fill #0

## 2024-06-28 DIAGNOSIS — M25561 Pain in right knee: Secondary | ICD-10-CM | POA: Diagnosis not present

## 2024-06-28 DIAGNOSIS — M7042 Prepatellar bursitis, left knee: Secondary | ICD-10-CM | POA: Diagnosis not present

## 2024-07-01 ENCOUNTER — Encounter: Admitting: Physician Assistant

## 2024-07-01 DIAGNOSIS — L97812 Non-pressure chronic ulcer of other part of right lower leg with fat layer exposed: Secondary | ICD-10-CM | POA: Diagnosis not present

## 2024-07-01 DIAGNOSIS — S81811A Laceration without foreign body, right lower leg, initial encounter: Secondary | ICD-10-CM | POA: Diagnosis not present

## 2024-07-01 DIAGNOSIS — F17211 Nicotine dependence, cigarettes, in remission: Secondary | ICD-10-CM | POA: Diagnosis not present

## 2024-07-01 DIAGNOSIS — L97822 Non-pressure chronic ulcer of other part of left lower leg with fat layer exposed: Secondary | ICD-10-CM | POA: Diagnosis not present

## 2024-07-01 DIAGNOSIS — S81812A Laceration without foreign body, left lower leg, initial encounter: Secondary | ICD-10-CM | POA: Diagnosis not present

## 2024-07-02 ENCOUNTER — Other Ambulatory Visit: Payer: Self-pay

## 2024-07-02 DIAGNOSIS — R350 Frequency of micturition: Secondary | ICD-10-CM | POA: Diagnosis not present

## 2024-07-02 DIAGNOSIS — S31000A Unspecified open wound of lower back and pelvis without penetration into retroperitoneum, initial encounter: Secondary | ICD-10-CM | POA: Diagnosis not present

## 2024-07-02 MED ORDER — CITALOPRAM HYDROBROMIDE 20 MG PO TABS
20.0000 mg | ORAL_TABLET | Freq: Every day | ORAL | 3 refills | Status: AC
  Filled 2024-07-02: qty 90, 90d supply, fill #0
  Filled 2024-09-23: qty 90, 90d supply, fill #1

## 2024-07-03 DIAGNOSIS — L570 Actinic keratosis: Secondary | ICD-10-CM | POA: Diagnosis not present

## 2024-07-10 ENCOUNTER — Ambulatory Visit (HOSPITAL_BASED_OUTPATIENT_CLINIC_OR_DEPARTMENT_OTHER): Admitting: General Surgery

## 2024-07-18 ENCOUNTER — Encounter: Attending: Physician Assistant | Admitting: Physician Assistant

## 2024-07-18 DIAGNOSIS — F17211 Nicotine dependence, cigarettes, in remission: Secondary | ICD-10-CM | POA: Diagnosis not present

## 2024-07-18 DIAGNOSIS — L97812 Non-pressure chronic ulcer of other part of right lower leg with fat layer exposed: Secondary | ICD-10-CM | POA: Insufficient documentation

## 2024-07-18 DIAGNOSIS — L97822 Non-pressure chronic ulcer of other part of left lower leg with fat layer exposed: Secondary | ICD-10-CM | POA: Insufficient documentation

## 2024-07-18 DIAGNOSIS — S81811A Laceration without foreign body, right lower leg, initial encounter: Secondary | ICD-10-CM | POA: Diagnosis not present

## 2024-07-18 DIAGNOSIS — S81812A Laceration without foreign body, left lower leg, initial encounter: Secondary | ICD-10-CM | POA: Insufficient documentation

## 2024-07-24 ENCOUNTER — Encounter: Admitting: Physician Assistant

## 2024-07-24 DIAGNOSIS — S81812A Laceration without foreign body, left lower leg, initial encounter: Secondary | ICD-10-CM | POA: Diagnosis not present

## 2024-07-24 DIAGNOSIS — L97822 Non-pressure chronic ulcer of other part of left lower leg with fat layer exposed: Secondary | ICD-10-CM | POA: Diagnosis not present

## 2024-07-24 DIAGNOSIS — S81811A Laceration without foreign body, right lower leg, initial encounter: Secondary | ICD-10-CM | POA: Diagnosis not present

## 2024-07-24 DIAGNOSIS — L97812 Non-pressure chronic ulcer of other part of right lower leg with fat layer exposed: Secondary | ICD-10-CM | POA: Diagnosis not present

## 2024-07-24 DIAGNOSIS — F17211 Nicotine dependence, cigarettes, in remission: Secondary | ICD-10-CM | POA: Diagnosis not present

## 2024-07-25 DIAGNOSIS — M1711 Unilateral primary osteoarthritis, right knee: Secondary | ICD-10-CM | POA: Diagnosis not present

## 2024-07-26 DIAGNOSIS — M5412 Radiculopathy, cervical region: Secondary | ICD-10-CM | POA: Diagnosis not present

## 2024-07-31 ENCOUNTER — Encounter: Admitting: Physician Assistant

## 2024-07-31 ENCOUNTER — Other Ambulatory Visit: Payer: Self-pay

## 2024-07-31 DIAGNOSIS — L97822 Non-pressure chronic ulcer of other part of left lower leg with fat layer exposed: Secondary | ICD-10-CM | POA: Diagnosis not present

## 2024-07-31 DIAGNOSIS — S81812A Laceration without foreign body, left lower leg, initial encounter: Secondary | ICD-10-CM | POA: Diagnosis not present

## 2024-07-31 DIAGNOSIS — S81811A Laceration without foreign body, right lower leg, initial encounter: Secondary | ICD-10-CM | POA: Diagnosis not present

## 2024-07-31 DIAGNOSIS — F17211 Nicotine dependence, cigarettes, in remission: Secondary | ICD-10-CM | POA: Diagnosis not present

## 2024-07-31 DIAGNOSIS — L299 Pruritus, unspecified: Secondary | ICD-10-CM | POA: Diagnosis not present

## 2024-07-31 DIAGNOSIS — L97812 Non-pressure chronic ulcer of other part of right lower leg with fat layer exposed: Secondary | ICD-10-CM | POA: Diagnosis not present

## 2024-07-31 MED ORDER — TRIAMCINOLONE ACETONIDE 0.1 % EX OINT
1.0000 | TOPICAL_OINTMENT | Freq: Every day | CUTANEOUS | 1 refills | Status: AC
  Filled 2024-07-31: qty 30, 30d supply, fill #0

## 2024-08-01 ENCOUNTER — Other Ambulatory Visit: Payer: Self-pay

## 2024-08-05 ENCOUNTER — Other Ambulatory Visit: Payer: Self-pay

## 2024-08-06 ENCOUNTER — Other Ambulatory Visit: Payer: Self-pay

## 2024-08-07 ENCOUNTER — Ambulatory Visit: Admitting: Physician Assistant

## 2024-08-12 ENCOUNTER — Other Ambulatory Visit: Payer: Self-pay

## 2024-08-12 ENCOUNTER — Other Ambulatory Visit (HOSPITAL_COMMUNITY): Payer: Self-pay

## 2024-08-13 ENCOUNTER — Other Ambulatory Visit (HOSPITAL_BASED_OUTPATIENT_CLINIC_OR_DEPARTMENT_OTHER): Payer: Self-pay

## 2024-08-13 ENCOUNTER — Other Ambulatory Visit: Payer: Self-pay

## 2024-08-13 ENCOUNTER — Encounter: Admitting: Physician Assistant

## 2024-08-13 DIAGNOSIS — F17211 Nicotine dependence, cigarettes, in remission: Secondary | ICD-10-CM | POA: Diagnosis not present

## 2024-08-13 DIAGNOSIS — S81811A Laceration without foreign body, right lower leg, initial encounter: Secondary | ICD-10-CM | POA: Diagnosis not present

## 2024-08-13 DIAGNOSIS — L97812 Non-pressure chronic ulcer of other part of right lower leg with fat layer exposed: Secondary | ICD-10-CM | POA: Diagnosis not present

## 2024-08-13 DIAGNOSIS — L97822 Non-pressure chronic ulcer of other part of left lower leg with fat layer exposed: Secondary | ICD-10-CM | POA: Diagnosis not present

## 2024-08-13 DIAGNOSIS — S81812A Laceration without foreign body, left lower leg, initial encounter: Secondary | ICD-10-CM | POA: Diagnosis not present

## 2024-08-13 MED ORDER — ALPRAZOLAM 0.5 MG PO TABS
0.5000 mg | ORAL_TABLET | Freq: Three times a day (TID) | ORAL | 0 refills | Status: AC | PRN
  Filled 2024-08-13: qty 270, 90d supply, fill #0

## 2024-08-14 ENCOUNTER — Other Ambulatory Visit: Payer: Self-pay

## 2024-08-14 MED ORDER — VALACYCLOVIR HCL 1 G PO TABS
2000.0000 mg | ORAL_TABLET | Freq: Two times a day (BID) | ORAL | 1 refills | Status: AC | PRN
  Filled 2024-08-14 – 2024-09-23 (×2): qty 90, 23d supply, fill #0

## 2024-08-15 ENCOUNTER — Other Ambulatory Visit: Payer: Self-pay

## 2024-08-24 ENCOUNTER — Other Ambulatory Visit: Payer: Self-pay

## 2024-08-27 ENCOUNTER — Encounter: Attending: Physician Assistant | Admitting: Physician Assistant

## 2024-08-27 DIAGNOSIS — L97812 Non-pressure chronic ulcer of other part of right lower leg with fat layer exposed: Secondary | ICD-10-CM | POA: Insufficient documentation

## 2024-08-27 DIAGNOSIS — F17211 Nicotine dependence, cigarettes, in remission: Secondary | ICD-10-CM | POA: Diagnosis not present

## 2024-08-27 DIAGNOSIS — L97822 Non-pressure chronic ulcer of other part of left lower leg with fat layer exposed: Secondary | ICD-10-CM | POA: Insufficient documentation

## 2024-08-27 DIAGNOSIS — S81812A Laceration without foreign body, left lower leg, initial encounter: Secondary | ICD-10-CM | POA: Diagnosis not present

## 2024-08-28 ENCOUNTER — Other Ambulatory Visit: Payer: Self-pay

## 2024-08-28 DIAGNOSIS — L820 Inflamed seborrheic keratosis: Secondary | ICD-10-CM | POA: Diagnosis not present

## 2024-08-28 DIAGNOSIS — I10 Essential (primary) hypertension: Secondary | ICD-10-CM | POA: Diagnosis not present

## 2024-08-28 DIAGNOSIS — I872 Venous insufficiency (chronic) (peripheral): Secondary | ICD-10-CM | POA: Diagnosis not present

## 2024-08-28 DIAGNOSIS — I700 Atherosclerosis of aorta: Secondary | ICD-10-CM | POA: Diagnosis not present

## 2024-08-28 DIAGNOSIS — L570 Actinic keratosis: Secondary | ICD-10-CM | POA: Diagnosis not present

## 2024-08-28 DIAGNOSIS — R7301 Impaired fasting glucose: Secondary | ICD-10-CM | POA: Diagnosis not present

## 2024-08-28 DIAGNOSIS — Z23 Encounter for immunization: Secondary | ICD-10-CM | POA: Diagnosis not present

## 2024-08-28 DIAGNOSIS — J449 Chronic obstructive pulmonary disease, unspecified: Secondary | ICD-10-CM | POA: Diagnosis not present

## 2024-08-28 DIAGNOSIS — D225 Melanocytic nevi of trunk: Secondary | ICD-10-CM | POA: Diagnosis not present

## 2024-08-28 DIAGNOSIS — L905 Scar conditions and fibrosis of skin: Secondary | ICD-10-CM | POA: Diagnosis not present

## 2024-08-28 DIAGNOSIS — J4489 Other specified chronic obstructive pulmonary disease: Secondary | ICD-10-CM | POA: Diagnosis not present

## 2024-08-28 DIAGNOSIS — L821 Other seborrheic keratosis: Secondary | ICD-10-CM | POA: Diagnosis not present

## 2024-08-28 DIAGNOSIS — K5010 Crohn's disease of large intestine without complications: Secondary | ICD-10-CM | POA: Diagnosis not present

## 2024-08-28 DIAGNOSIS — Z85828 Personal history of other malignant neoplasm of skin: Secondary | ICD-10-CM | POA: Diagnosis not present

## 2024-08-28 MED ORDER — VITAMIN D (ERGOCALCIFEROL) 50000 UNITS PO CAPS
1.0000 | ORAL_CAPSULE | ORAL | 1 refills | Status: AC
  Filled 2024-08-28: qty 4, 28d supply, fill #0

## 2024-08-29 ENCOUNTER — Other Ambulatory Visit: Payer: Self-pay

## 2024-09-10 ENCOUNTER — Encounter: Admitting: Physician Assistant

## 2024-09-10 DIAGNOSIS — S81812A Laceration without foreign body, left lower leg, initial encounter: Secondary | ICD-10-CM | POA: Diagnosis not present

## 2024-09-10 DIAGNOSIS — L97822 Non-pressure chronic ulcer of other part of left lower leg with fat layer exposed: Secondary | ICD-10-CM | POA: Diagnosis not present

## 2024-09-23 ENCOUNTER — Other Ambulatory Visit: Payer: Self-pay

## 2024-09-23 ENCOUNTER — Encounter: Attending: Physician Assistant | Admitting: Physician Assistant

## 2024-09-23 ENCOUNTER — Encounter: Admitting: Physician Assistant

## 2024-09-23 MED FILL — Losartan Potassium Tab 100 MG: ORAL | 90 days supply | Qty: 90 | Fill #1 | Status: AC

## 2024-09-23 MED FILL — Diltiazem HCl Coated Beads Cap ER 24HR 180 MG: ORAL | 90 days supply | Qty: 90 | Fill #1 | Status: AC

## 2024-09-24 ENCOUNTER — Encounter: Admitting: Physician Assistant

## 2024-09-25 ENCOUNTER — Encounter: Admitting: Physician Assistant

## 2024-10-04 ENCOUNTER — Ambulatory Visit: Payer: Self-pay | Admitting: Emergency Medicine

## 2024-10-04 DIAGNOSIS — R051 Acute cough: Secondary | ICD-10-CM

## 2024-10-04 NOTE — Telephone Encounter (Addendum)
 Dr Shelah,   If you agree to a cxr, do you want it before or after appt?

## 2024-10-04 NOTE — Telephone Encounter (Signed)
 Nicole [name] Pulmonary Triage - Initial Assessment Questions Chief Complaint (e.g., cough, sob, wheezing, fever, chills, sweat or additional symptoms) *Go to specific symptom protocol after initial questions. SOB and wheezing, productive cough with thick white to clear mucous  How long have symptoms been present? Chronic, worsening over the past month  Have you tested for COVID or Flu? Note: If not, ask patient if a home test can be taken. If so, instruct patient to call back for positive results. No  MEDICINES:   Have you used any OTC meds to help with symptoms? No If yes, ask What medications? N/A.  Have you used your inhalers/maintenance medication? Yes If yes, What medications? Albuterol  inhaler, using one puff just use it whenever I need it for coughing (advised it is prescribed 2 puffs every 6 hours as needed); Breztri  1 puff twice daily (advised, the medication is prescribed 2 puffs bid). Patient has not been using her nebulizer, RN advised on nebulizer prescription and use (as needed for wheezing or SOB)  If inhaler, ask How many puffs and how often? Note: Review instructions on medication in the chart. See above.  OXYGEN: Do you wear supplemental oxygen? No If yes, How many liters are you supposed to use? N/A.  Do you monitor your oxygen levels? Yes If yes, What is your reading (oxygen level) today? [place].  What is your usual oxygen saturation reading?  (Note: Pulmonary O2 sats should be 90% or greater) States sometimes it is around 96-98%.             Copied from CRM #[id number]. Topic: Clinical - Red Word Triage >> Oct 04, 2024  9:42 AM Leila BROCKS wrote: Red Word that prompted transfer to Nurse Triage: Patient [phone] states asthma ia getting worse within the last month, more shortness of breath and wheezing, denies pain, nor dizziness. Patient wants to see Dr. Shelah next week. Please advise. Reason  for Disposition  [1] Longstanding difficulty breathing (e.g., CHF, COPD, emphysema) AND [2] WORSE than normal  Answer Assessment - Initial Assessment Questions E2C2 Pulmonary Triage - Initial Assessment Questions Chief Complaint (e.g., cough, sob, wheezing, fever, chills, sweat or additional symptoms) *Go to specific symptom protocol after initial questions. SOB and wheezing, productive cough with thick white to clear mucous  How long have symptoms been present? Chronic, worsening over the past month  Have you tested for COVID or Flu? Note: If not, ask patient if a home test can be taken. If so, instruct patient to call back for positive results. No  MEDICINES:   Have you used any OTC meds to help with symptoms? No If yes, ask What medications? N/A.  Have you used your inhalers/maintenance medication? Yes If yes, What medications? Albuterol  inhaler, using one puff just use it whenever I need it for coughing (advised it is prescribed 2 puffs every 6 hours as needed); Breztri  1 puff twice daily (advised, the medication is prescribed 2 puffs bid). Patient has not been using her nebulizer, RN advised on nebulizer prescription and use (as needed for wheezing or SOB)  If inhaler, ask How many puffs and how often? Note: Review instructions on medication in the chart. See above.  OXYGEN: Do you wear supplemental oxygen? No If yes, How many liters are you supposed to use? N/A.  Do you monitor your oxygen levels? Yes If yes, What is your reading (oxygen level) today? She doesn't have one [place].  What  is your usual oxygen saturation reading?  (Note: Pulmonary O2 sats should be 90% or greater) States sometimes it is around 96-98%.          1. RESPIRATORY STATUS: Describe your breathing? (e.g., wheezing, shortness of breath, unable to speak, severe coughing)      SOB, wheezing, productive cough  with thick white to cough mucous.  2.  ONSET: When did this breathing problem begin?      Chronic, worsening over the past month.  3. PATTERN Does the difficult breathing come and go, or has it been constant since it started?      Constant.  4. SEVERITY: How bad is your breathing? (e.g., mild, moderate, severe)      No labored breathing or respiratory distress noted during triage. Patient speaking in full sentences. She states after using her inhaler use she will cough up a lung. Mild at this time.  5. RECURRENT SYMPTOM: Have you had difficulty breathing before? If Yes, ask: When was the last time? and What happened that time?      Yes. Chronic issue that she states she is always SOB at baseline due to smoking.  6. CARDIAC HISTORY: Do you have any history of heart disease? (e.g., heart attack, angina, bypass surgery, angioplasty)      No.  7. LUNG HISTORY: Do you have any history of lung disease?  (e.g., pulmonary embolus, asthma, emphysema)     Asthma, COPD.  8. CAUSE: What do you think is causing the breathing problem?      She states she thinks related to smoking and needs to quit.  9. OTHER SYMPTOMS: Do you have any other symptoms? (e.g., chest pain, cough, dizziness, fever, runny nose)     Feels like her voice is sounding deeper, runny nose. Denies fever, chest pain, dizziness/lightheaded currently (states after a coughing spell will feel lightheaded).  10. O2 SATURATION MONITOR:  Do you use an oxygen saturation monitor (pulse oximeter) at home? If Yes, ask: What is your reading (oxygen level) today? What is your usual oxygen saturation reading? (e.g., 95%)       Doesn't have one with her.  11. PREGNANCY: Is there any chance you are pregnant? When was your last menstrual period?       N/A.  12. TRAVEL: Have you traveled out of the country in the last month? (e.g., travel history, exposures)       She was exposed to her sister last week who has pneumonia but states she doesn't think she  got sick from her.  Protocols used: Breathing Difficulty-A-AH

## 2024-10-04 NOTE — Telephone Encounter (Signed)
 This sounds like a good plan.  I have ordered a chest x-ray to be done at her upcoming office visit

## 2024-10-04 NOTE — Telephone Encounter (Signed)
 Call and spoke with the pt and advised [facility] does not have any openings. Advised pt if worsening symptoms occur to been seen at [facility] or ED.  Pt will call back next wk depending on her symptoms.  I offered appt at [facility] but the pt declined.

## 2024-10-07 ENCOUNTER — Encounter: Admitting: Physician Assistant

## 2024-10-07 ENCOUNTER — Other Ambulatory Visit: Payer: Self-pay

## 2024-10-07 MED ORDER — VARENICLINE TARTRATE 0.5 MG PO TABS
ORAL_TABLET | ORAL | 0 refills | Status: AC
  Filled 2024-10-07: qty 9, 6d supply, fill #0

## 2024-10-07 MED ORDER — VARENICLINE TARTRATE 1 MG PO TABS
1.0000 mg | ORAL_TABLET | Freq: Two times a day (BID) | ORAL | 5 refills | Status: AC
  Filled 2024-10-07: qty 60, 30d supply, fill #0

## 2024-10-08 ENCOUNTER — Encounter

## 2024-10-08 ENCOUNTER — Other Ambulatory Visit: Payer: Self-pay

## 2024-10-13 ENCOUNTER — Other Ambulatory Visit: Payer: Self-pay

## 2024-10-13 MED ORDER — CEPHALEXIN 500 MG PO CAPS
500.0000 mg | ORAL_CAPSULE | Freq: Three times a day (TID) | ORAL | 0 refills | Status: AC
  Filled 2024-10-13: qty 30, 10d supply, fill #0

## 2024-10-14 ENCOUNTER — Other Ambulatory Visit: Payer: Self-pay

## 2024-10-21 ENCOUNTER — Encounter: Attending: Internal Medicine | Admitting: Internal Medicine

## 2024-10-21 DIAGNOSIS — F17211 Nicotine dependence, cigarettes, in remission: Secondary | ICD-10-CM | POA: Insufficient documentation

## 2024-10-21 DIAGNOSIS — X58XXXA Exposure to other specified factors, initial encounter: Secondary | ICD-10-CM | POA: Diagnosis not present

## 2024-10-21 DIAGNOSIS — S81812A Laceration without foreign body, left lower leg, initial encounter: Secondary | ICD-10-CM | POA: Insufficient documentation

## 2024-10-21 DIAGNOSIS — S81811A Laceration without foreign body, right lower leg, initial encounter: Secondary | ICD-10-CM | POA: Diagnosis present

## 2024-10-21 DIAGNOSIS — L97822 Non-pressure chronic ulcer of other part of left lower leg with fat layer exposed: Secondary | ICD-10-CM | POA: Insufficient documentation

## 2024-10-21 DIAGNOSIS — L97812 Non-pressure chronic ulcer of other part of right lower leg with fat layer exposed: Secondary | ICD-10-CM | POA: Insufficient documentation

## 2024-10-23 ENCOUNTER — Other Ambulatory Visit (HOSPITAL_COMMUNITY): Payer: Self-pay

## 2024-10-23 ENCOUNTER — Other Ambulatory Visit: Payer: Self-pay

## 2024-11-18 ENCOUNTER — Other Ambulatory Visit: Payer: Self-pay

## 2024-11-18 MED ORDER — PREDNISONE 5 MG PO TABS
ORAL_TABLET | ORAL | 0 refills | Status: AC
  Filled 2024-11-18: qty 21, 12d supply, fill #0

## 2024-11-22 ENCOUNTER — Other Ambulatory Visit: Payer: Self-pay

## 2024-11-22 MED ORDER — NYSTATIN 100000 UNIT/ML MT SUSP
4.0000 mL | Freq: Four times a day (QID) | OROMUCOSAL | 0 refills | Status: AC
  Filled 2024-11-22 – 2024-11-23 (×2): qty 224, 14d supply, fill #0

## 2024-11-23 ENCOUNTER — Other Ambulatory Visit: Payer: Self-pay

## 2024-11-25 ENCOUNTER — Other Ambulatory Visit: Payer: Self-pay

## 2024-11-26 ENCOUNTER — Other Ambulatory Visit: Payer: Self-pay

## 2024-12-04 ENCOUNTER — Other Ambulatory Visit: Payer: Self-pay

## 2024-12-04 ENCOUNTER — Telehealth: Payer: Self-pay

## 2024-12-04 MED ORDER — ONDANSETRON HCL 4 MG PO TABS
4.0000 mg | ORAL_TABLET | Freq: Four times a day (QID) | ORAL | 0 refills | Status: AC | PRN
  Filled 2024-12-04: qty 15, 4d supply, fill #0

## 2024-12-04 NOTE — Telephone Encounter (Unsigned)
 Copied from CRM #[id number]. Topic: Clinical - Medication Question >> Dec 03, 2024  4:17 PM Chantha C wrote: Reason for CRM: Patient wants samples of inhaler, unsure of the dosage; budesonide -glycopyrrolate -formoterol  (BREZTRI  AEROSPHERE) 160-9-4.8 MCG/ACT AERO inhaler, it's $500 from [facility]. Patient is almost of out medication. Please advise and call back [phone].

## 2024-12-05 ENCOUNTER — Telehealth: Payer: Self-pay

## 2024-12-05 ENCOUNTER — Other Ambulatory Visit: Payer: Self-pay

## 2024-12-05 NOTE — Telephone Encounter (Signed)
 Copied from CRM #[id number]. Topic: Clinical - Medication Question >> Dec 03, 2024  4:17 PM Chantha C wrote: Reason for CRM: Patient wants samples of inhaler, unsure of the dosage; budesonide -glycopyrrolate -formoterol  (BREZTRI  AEROSPHERE) 160-9-4.8 MCG/ACT AERO inhaler, it's $500 from [facility]. Patient is almost of out medication. Please advise and call back [phone]. >> Dec 05, 2024 12:02 PM Dedra B wrote: Patient called to follow up on inhaler samples.    Tried to reach out to patient VM/LM its more than likely due to a deductible.   She needs to reach out to her insurance

## 2024-12-06 ENCOUNTER — Other Ambulatory Visit (HOSPITAL_COMMUNITY): Payer: Self-pay

## 2024-12-18 ENCOUNTER — Ambulatory Visit: Admitting: Emergency Medicine

## 2024-12-18 ENCOUNTER — Encounter: Payer: Self-pay | Admitting: Emergency Medicine

## 2024-12-18 VITALS — BP 131/68 | HR 78 | Temp 98.0°F | Ht 66.0 in | Wt 170.0 lb

## 2024-12-18 DIAGNOSIS — R0683 Snoring: Secondary | ICD-10-CM | POA: Diagnosis not present

## 2024-12-18 DIAGNOSIS — K219 Gastro-esophageal reflux disease without esophagitis: Secondary | ICD-10-CM

## 2024-12-18 DIAGNOSIS — Z720 Tobacco use: Secondary | ICD-10-CM | POA: Insufficient documentation

## 2024-12-18 DIAGNOSIS — J4489 Other specified chronic obstructive pulmonary disease: Secondary | ICD-10-CM | POA: Diagnosis not present

## 2024-12-18 DIAGNOSIS — F17210 Nicotine dependence, cigarettes, uncomplicated: Secondary | ICD-10-CM

## 2024-12-18 DIAGNOSIS — J309 Allergic rhinitis, unspecified: Secondary | ICD-10-CM

## 2024-12-18 MED ORDER — BREZTRI AEROSPHERE 160-9-4.8 MCG/ACT IN AERO: 2.0000 | INHALATION_SPRAY | Freq: Two times a day (BID) | RESPIRATORY_TRACT | Status: AC

## 2024-12-18 NOTE — Progress Notes (Signed)
 "  Subjective:    Patient ID: Nicole [name], female    DOB: [birth year], 69 y.o.   MRN: [id number]  Asthma She complains of cough and wheezing. There is no shortness of breath. Pertinent negatives include no ear pain, fever, headaches, postnasal drip, rhinorrhea, sneezing, sore throat or trouble swallowing. Her past medical history is significant for asthma.   ROV 02/16/24 --follow-up visit 69 year old woman with history of tobacco use, COPD/asthma, chronic cough with upper airway irritability in the setting of rhinitis, GERD.  PMH also significant for hypertension, aortic atherosclerosis followed by cardiology, Crohn's dz.  She underwent a cardiac coronary morphology CTA 02/12/2024 as below.  Cardiology ordered a sleep study for her as well.  Today she reports that she is having LE pain from OA and knee injury - has not been able to work. Smoking less than 1 pk/day. Having more mucous and cough, brownish sputum. Sensitive to perfumes. No steroids, no abx. She has had a lot of fatigue. She snores loudly.  Currently managed on Advair , remains on nexium , flonase  prn, astelin .   CTA chest/coronary 02/12/2024 reviewed by me showed coronary artery disease (24th percentile).  Radiology read is still pending, some mild subpleural reticulation at both bases present  PFT 05/2021 with moderately severe obstruction and a positive bronchodilator response.  ROV 12/18/2024 --69 year old woman who follows up today for history of tobacco use and associated COPD/asthma, chronic cough with upper airway irritation and associated rhinitis, GERD. PMH: Hypertension, aortic atherosclerosis, Crohn's disease. I changed her to Breztri , had initially had difficulty getting it due to expense.  Today she reports that she has been smoking more - up to 2 pks a day. She is having more cough, an irritated throat. Sometimes productive of white frothy sputum. Some associated SOB. Using albuterol  3-4x a day - often for cough and tightness.  No flares, no pred or abx.  Astelin  nasal spray prn, added back fluticasone  nasal spray in April but only using rarely.  Nexium  - working well   Sleep study 02/19/24 >> no significant OSA   Review of Systems  Constitutional:  Negative for fever and unexpected weight change.  HENT:  Negative for congestion, dental problem, ear pain, nosebleeds, postnasal drip, rhinorrhea, sinus pressure, sneezing, sore throat and trouble swallowing.   Eyes:  Negative for redness and itching.  Respiratory:  Positive for cough and wheezing. Negative for chest tightness and shortness of breath.   Cardiovascular:  Negative for palpitations and leg swelling.  Gastrointestinal:  Negative for nausea and vomiting.  Genitourinary:  Negative for dysuria.  Musculoskeletal:  Negative for joint swelling.  Skin:  Negative for rash.  Neurological:  Negative for headaches.  Hematological:  Does not bruise/bleed easily.  Psychiatric/Behavioral:  Negative for dysphoric mood. The patient is not nervous/anxious.        Objective:   Physical Exam Vitals:   12/18/24 1336  BP: 131/68  Pulse: 78  Temp: 98 F (36.7 C)  SpO2: 95%  Weight: 170 lb (77.1 kg)  Height: 5' 6 (1.676 m)    Gen: Pleasant, well-nourished, in no distress,  normal affect  ENT: No lesions,  mouth clear,  oropharynx clear, no postnasal drip  Neck: No JVD, no stridor  Lungs: No use of accessory muscles, clear without rales or rhonchi  Cardiovascular: RRR, soft blowing systolic M with intact S1S2  Musculoskeletal: No deformities, no cyanosis or clubbing  Neuro: alert, non focal  Skin: Warm, no lesions or rash      Assessment &  Plan:  Tobacco use More cough, throat symptoms, dyspnea since she started smoking heavily again.  Now back up to 2 packs daily.  She is motivated to stop and thinks that she will need to do so cold turkey.  I did recommend that she could try weaning some so that the leap to 0 would not be so drastic.  We discussed  strategies, recommendations to help her succeed.  COPD with asthma (HCC) Continue Breztri  2 puffs twice a day.  Rinse and gargle after using. Keep your albuterol  available use 2 puffs if needed for shortness of breath, chest tightness, wheezing. Follow-up in our office in 6 months.  Please call if you have any problems we can see you sooner.  Allergic rhinitis Chronic rhinitis.  She stopped her Astelin  and fluticasone  nasal spray because she did not feel like they were effective.  Encouraged her to retry  GERD (gastroesophageal reflux disease) Good control on her current PPI  Snoring Her sleep study did not show any evidence for significant OSA.  Smoking/Tobacco Cessation Counseling Nicole [name] is a current user of tobacco or nicotine products. She is ready to quit at this time. Counseling provided today addressed the risks of continued use and the benefits of cessation. Discussed tobacco/nicotine use history, readiness to quit, and evidence-based treatment options including behavioral strategies, support resources, and pharmacologic therapies. Provided encouragement and educational materials on steps and resources to quit smoking. Patient questions were addressed, and follow-up recommended for continued support. Total time spent on counseling: 7 minutes.    I personally spent a total of 40 minutes in the care of the patient today including preparing to see the patient, getting/reviewing separately obtained history, performing a medically appropriate exam/evaluation, counseling and educating, placing orders, documenting clinical information in the EHR, independently interpreting results, and communicating results.   Lamar Chris, MD, PhD 12/18/2024, 1:53 PM [facility] [phone] or if no answer [phone]  "

## 2024-12-18 NOTE — Assessment & Plan Note (Signed)
 Her sleep study did not show any evidence for significant OSA.

## 2024-12-18 NOTE — Patient Instructions (Signed)
 I am glad you are motivated to stop smoking.  Please try to cut down to a point where you are ready to set a quit date.  We talked about getting rid of your cigarettes, lighters, ashtrays at that time in order to quit.  You could try using nicotine patches or gum for cravings in the several days after you stop.  Be sure to tell people that you stop so they do not smoke around you or try to tempt you. Continue Breztri  2 puffs twice a day.  Rinse and gargle after using. Keep your albuterol  available use 2 puffs if needed for shortness of breath, chest tightness, wheezing. Continue your Nexium  as you have been taking it Consider using your Astelin  nasal spray 2-3 times daily as needed to cut down on your nasal drainage and congestion. Reviewed your sleep study.  There is no evidence of obstructive sleep apnea.  Good news. Follow-up in our office in 6 months.  Please call if you have any problems we can see you sooner.

## 2024-12-18 NOTE — Assessment & Plan Note (Signed)
 Continue Breztri  2 puffs twice a day.  Rinse and gargle after using. Keep your albuterol  available use 2 puffs if needed for shortness of breath, chest tightness, wheezing. Follow-up in our office in 6 months.  Please call if you have any problems we can see you sooner.

## 2024-12-18 NOTE — Assessment & Plan Note (Signed)
 Good control on her current PPI

## 2024-12-18 NOTE — Assessment & Plan Note (Signed)
 Chronic rhinitis.  She stopped her Astelin  and fluticasone  nasal spray because she did not feel like they were effective.  Encouraged her to retry

## 2024-12-18 NOTE — Assessment & Plan Note (Signed)
 More cough, throat symptoms, dyspnea since she started smoking heavily again.  Now back up to 2 packs daily.  She is motivated to stop and thinks that she will need to do so cold turkey.  I did recommend that she could try weaning some so that the leap to 0 would not be so drastic.  We discussed strategies, recommendations to help her succeed.

## 2024-12-20 ENCOUNTER — Other Ambulatory Visit (HOSPITAL_COMMUNITY): Payer: Self-pay | Admitting: Internal Medicine

## 2024-12-20 DIAGNOSIS — R1031 Right lower quadrant pain: Secondary | ICD-10-CM

## 2024-12-20 DIAGNOSIS — R110 Nausea: Secondary | ICD-10-CM

## 2024-12-30 ENCOUNTER — Ambulatory Visit (HOSPITAL_COMMUNITY)
